# Patient Record
Sex: Male | Born: 1955 | Race: Black or African American | Hispanic: No | Marital: Married | Smoking: Never smoker
Health system: Southern US, Community
[De-identification: ages and names within clinical notes are randomized; demographics above are authoritative.]

## PROBLEM LIST (undated history)

## (undated) DIAGNOSIS — M130 Polyarthritis, unspecified: Secondary | ICD-10-CM

## (undated) DIAGNOSIS — R7303 Prediabetes: Secondary | ICD-10-CM

## (undated) DIAGNOSIS — M71161 Other infective bursitis, right knee: Secondary | ICD-10-CM

## (undated) DIAGNOSIS — N529 Male erectile dysfunction, unspecified: Secondary | ICD-10-CM

## (undated) DIAGNOSIS — M109 Gout, unspecified: Secondary | ICD-10-CM

## (undated) DIAGNOSIS — E119 Type 2 diabetes mellitus without complications: Secondary | ICD-10-CM

## (undated) HISTORY — DX: Gout, unspecified: M10.9

## (undated) HISTORY — PX: ROTATOR CUFF REPAIR: SHX139

## (undated) HISTORY — DX: Male erectile dysfunction, unspecified: N52.9

## (undated) HISTORY — PX: KNEE SURGERY: SHX244

## (undated) HISTORY — PX: CHOLECYSTECTOMY: SHX55

## (undated) HISTORY — DX: Polyarthritis, unspecified: M13.0

## (undated) HISTORY — PX: ACHILLES TENDON REPAIR: SUR1153

## (undated) HISTORY — DX: Other infective bursitis, right knee: M71.161

---
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
## 2001-11-23 ENCOUNTER — Inpatient Hospital Stay (HOSPITAL_COMMUNITY): Admission: EM | Admit: 2001-11-23 | Discharge: 2001-11-25 | Payer: Self-pay | Admitting: Emergency Medicine

## 2001-11-23 ENCOUNTER — Encounter: Payer: Self-pay | Admitting: Emergency Medicine

## 2001-11-24 ENCOUNTER — Encounter: Payer: Self-pay | Admitting: Family Medicine

## 2001-11-29 ENCOUNTER — Encounter: Admission: RE | Admit: 2001-11-29 | Discharge: 2001-11-29 | Payer: Self-pay | Admitting: Family Medicine

## 2001-12-01 ENCOUNTER — Ambulatory Visit (HOSPITAL_COMMUNITY): Admission: RE | Admit: 2001-12-01 | Discharge: 2001-12-01 | Payer: Self-pay | Admitting: Sports Medicine

## 2001-12-01 ENCOUNTER — Encounter: Admission: RE | Admit: 2001-12-01 | Discharge: 2001-12-01 | Payer: Self-pay | Admitting: Family Medicine

## 2001-12-05 ENCOUNTER — Ambulatory Visit (HOSPITAL_COMMUNITY): Admission: RE | Admit: 2001-12-05 | Discharge: 2001-12-05 | Payer: Self-pay | Admitting: Sports Medicine

## 2001-12-12 ENCOUNTER — Encounter: Admission: RE | Admit: 2001-12-12 | Discharge: 2001-12-12 | Payer: Self-pay | Admitting: Family Medicine

## 2002-01-10 ENCOUNTER — Encounter: Admission: RE | Admit: 2002-01-10 | Discharge: 2002-01-10 | Payer: Self-pay | Admitting: Family Medicine

## 2002-11-08 ENCOUNTER — Encounter: Payer: Self-pay | Admitting: Emergency Medicine

## 2002-11-08 ENCOUNTER — Emergency Department (HOSPITAL_COMMUNITY): Admission: EM | Admit: 2002-11-08 | Discharge: 2002-11-08 | Payer: Self-pay | Admitting: Emergency Medicine

## 2002-11-29 ENCOUNTER — Encounter: Admission: RE | Admit: 2002-11-29 | Discharge: 2002-11-29 | Payer: Self-pay | Admitting: Specialist

## 2002-11-29 ENCOUNTER — Encounter: Payer: Self-pay | Admitting: Specialist

## 2004-03-26 ENCOUNTER — Encounter: Admission: RE | Admit: 2004-03-26 | Discharge: 2004-03-26 | Payer: Self-pay | Admitting: Occupational Medicine
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
## 2004-04-02 ENCOUNTER — Emergency Department (HOSPITAL_COMMUNITY): Admission: EM | Admit: 2004-04-02 | Discharge: 2004-04-02 | Payer: Self-pay | Admitting: Emergency Medicine

## 2004-04-02 IMAGING — CR DG CHEST 2V
2 series · 2 of 2 positions shown · non-contrast
Comparison: none

CLINICAL DATA: Cough for five days. Some chest pain. Upper respiratory symptoms. 
 CHEST-TWO VIEW:
 PA and lateral views of the chest without previous films for comparison are compared to a report of [DATE] which reported some bilateral basilar atelectasis or scarring.  Now there is some atelectasis or scarring seen associated with the left base. There is mild diffuse peribronchial thickening consistent with bronchitis. There is no consolidation, pleural effusion or pneumothorax. The heart and mediastinum are normal for this degree of inspiration.  The bones appear normal.

[view not recorded (1 of 2)]
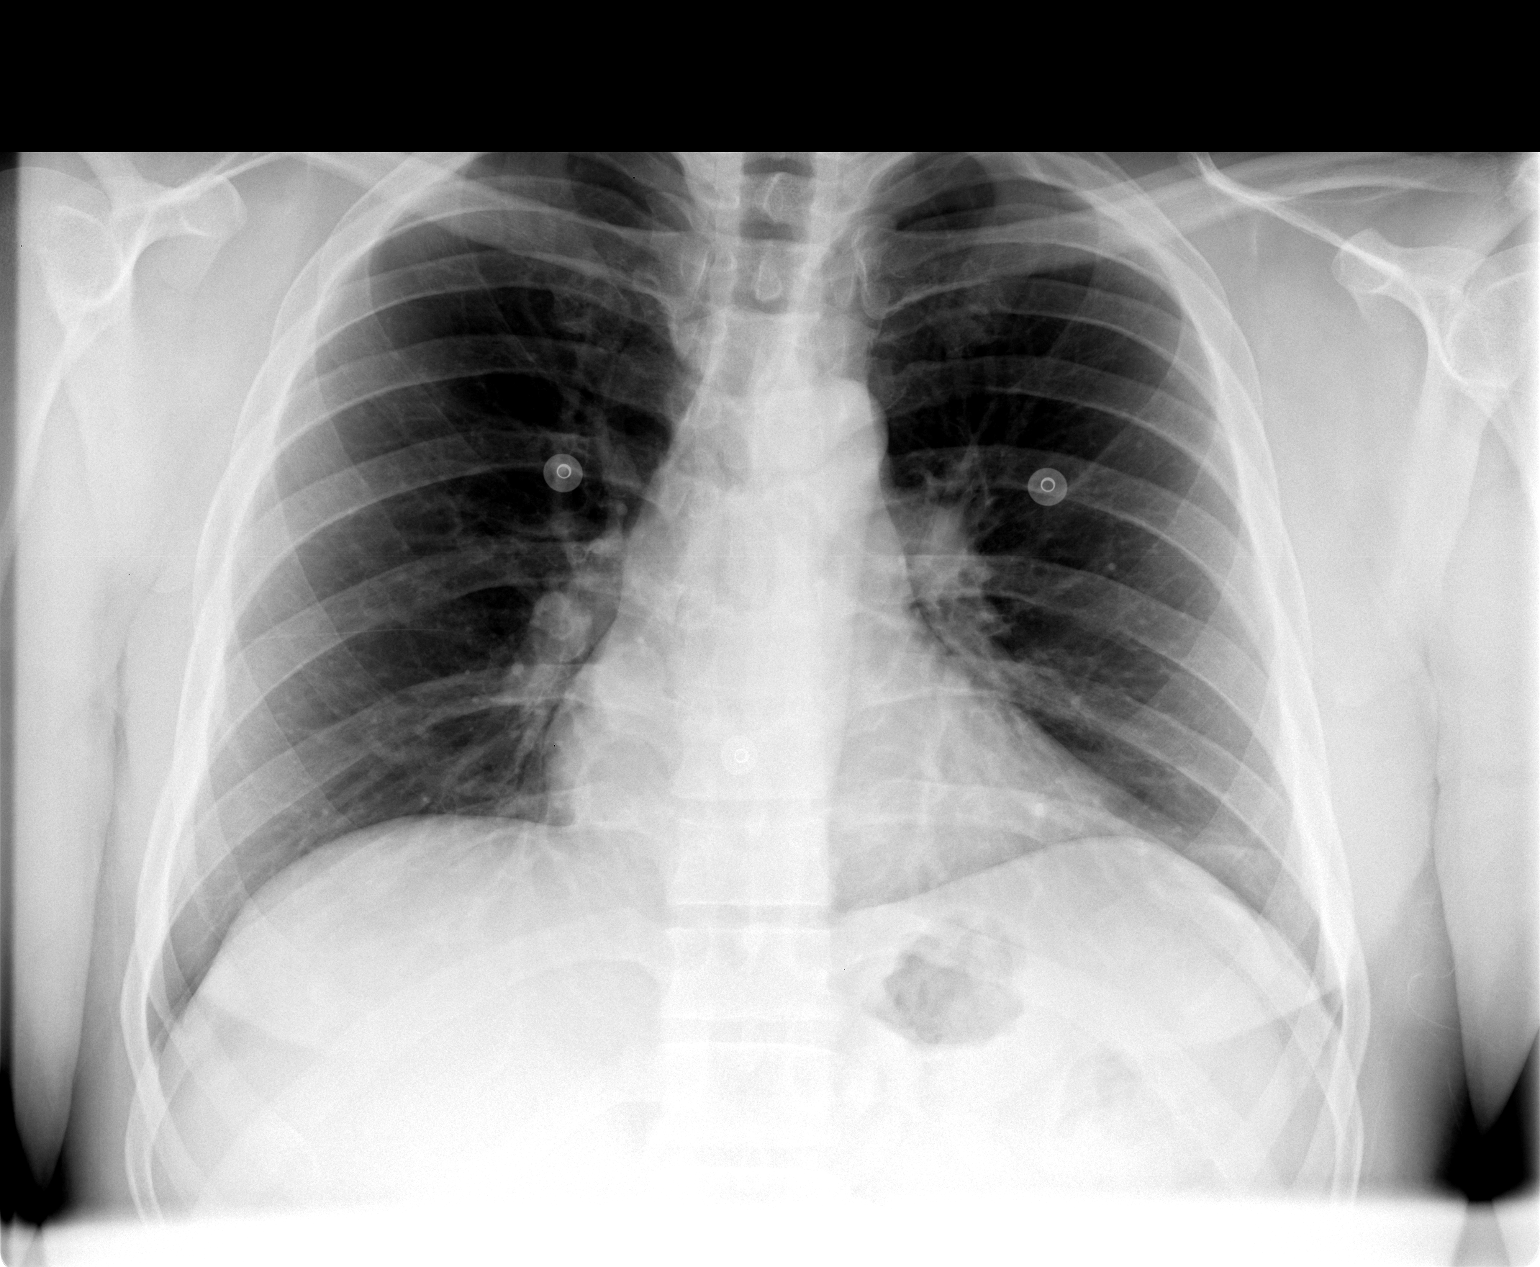

[view not recorded (2 of 2)]
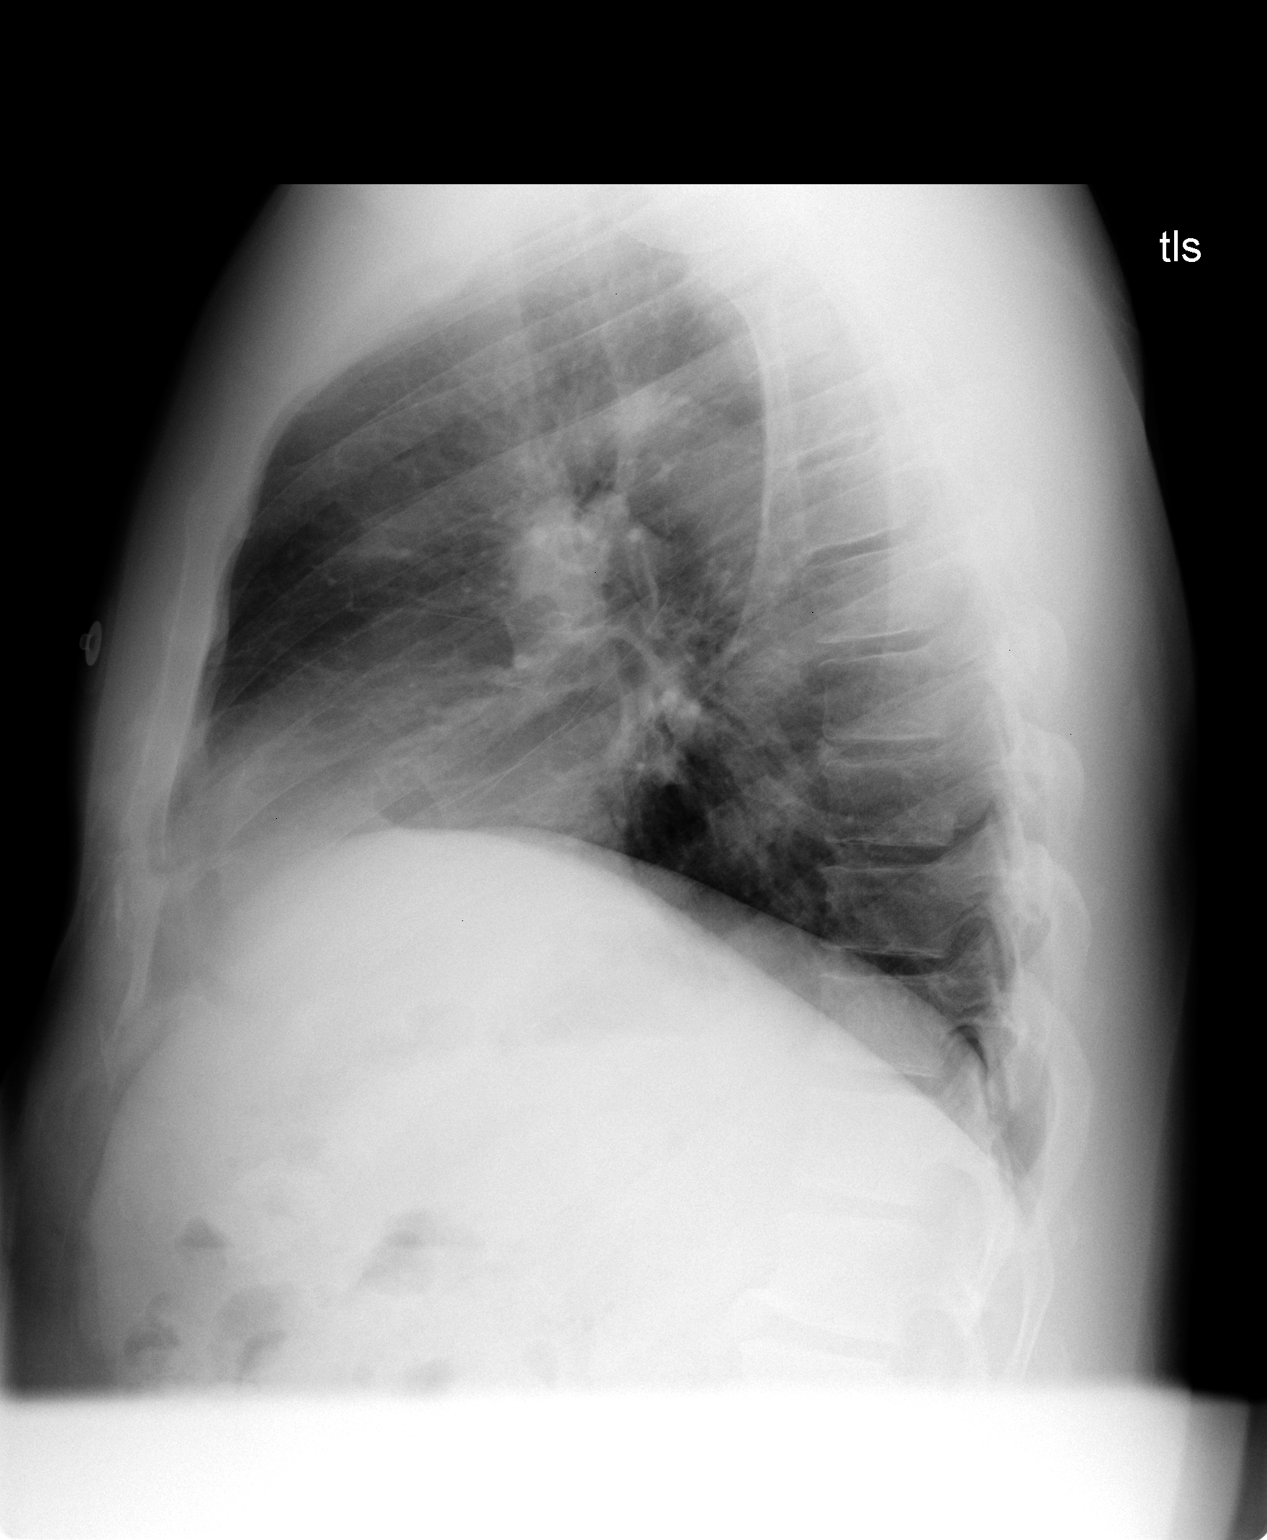

[2 of 2 positions shown; findings below may reference images not displayed]

IMPRESSION: Mild diffuse peribronchial thickening.  Mild scarring and/or atelectasis left base thought to be similar to a previously reported change of [DATE].

## 2005-01-06 ENCOUNTER — Encounter: Admission: RE | Admit: 2005-01-06 | Discharge: 2005-01-06 | Payer: Self-pay | Admitting: Occupational Medicine
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
## 2005-03-02 HISTORY — PX: THUMB ARTHROSCOPY: SHX2509

## 2005-08-21 ENCOUNTER — Emergency Department (HOSPITAL_COMMUNITY): Admission: EM | Admit: 2005-08-21 | Discharge: 2005-08-21 | Payer: Self-pay | Admitting: Emergency Medicine

## 2005-08-21 IMAGING — CR DG FOOT COMPLETE 3+V*R*
3 series · 3 of 3 positions shown · non-contrast
Comparison: none

CLINICAL DATA: No injury.  Soft tissue swelling.  Great toe pain. 
RIGHT FOOT - 3 VIEW ? [DATE]:

[t foot ap right]
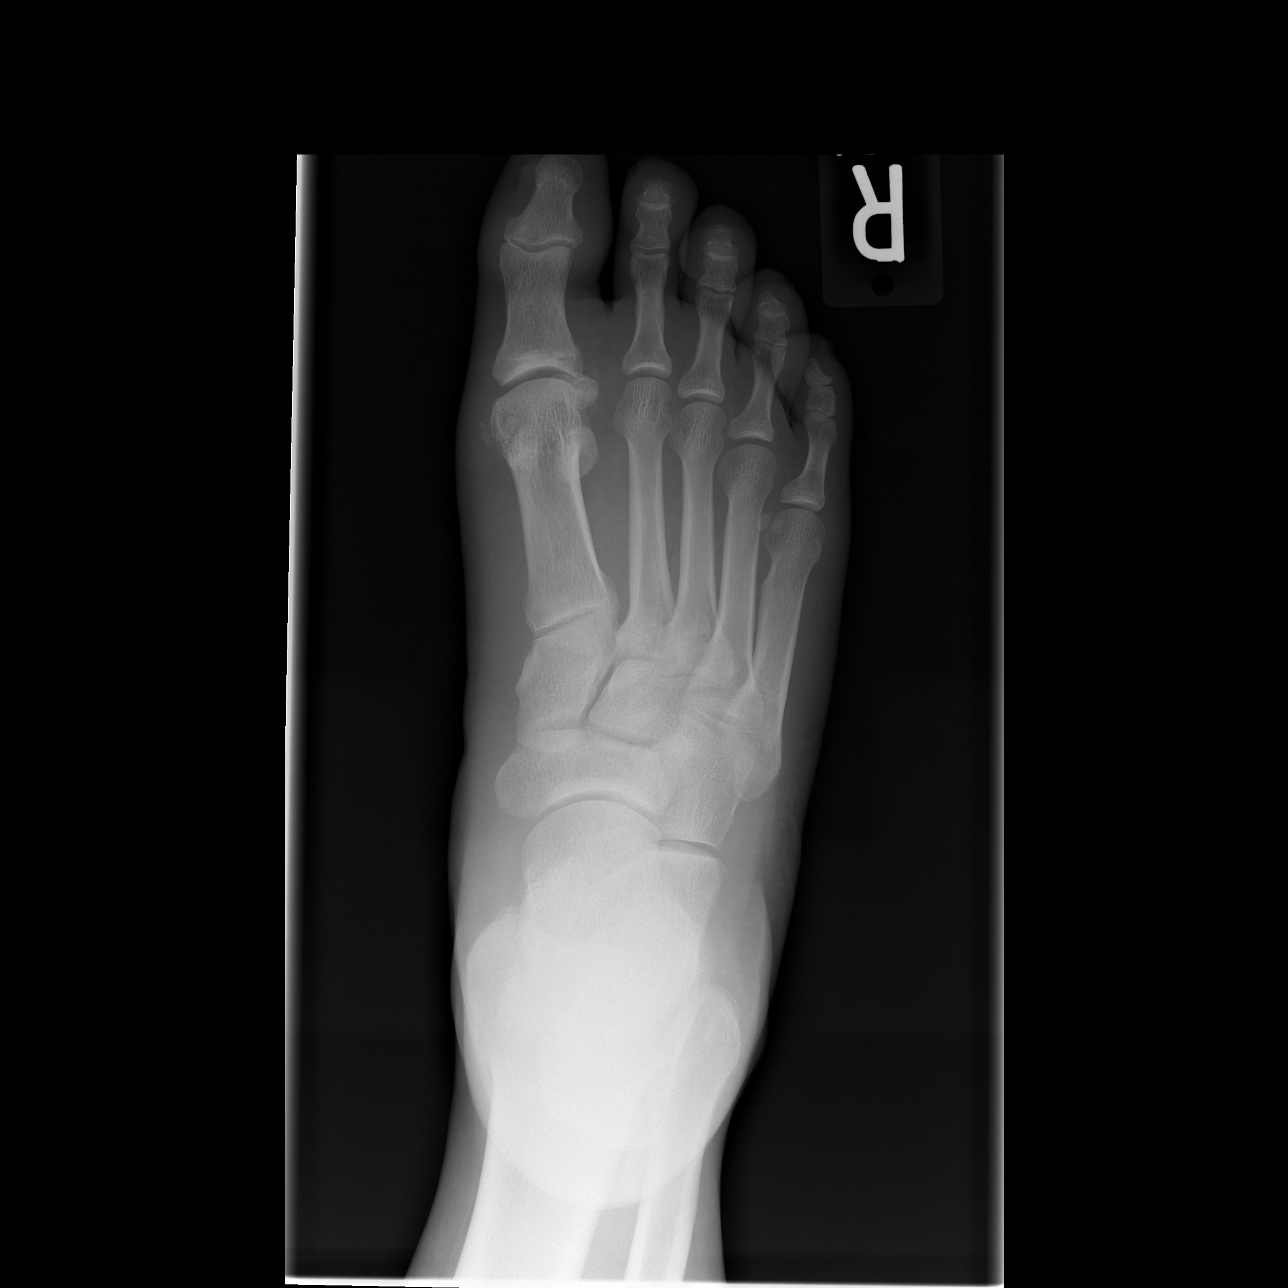

[t foot oblique right]
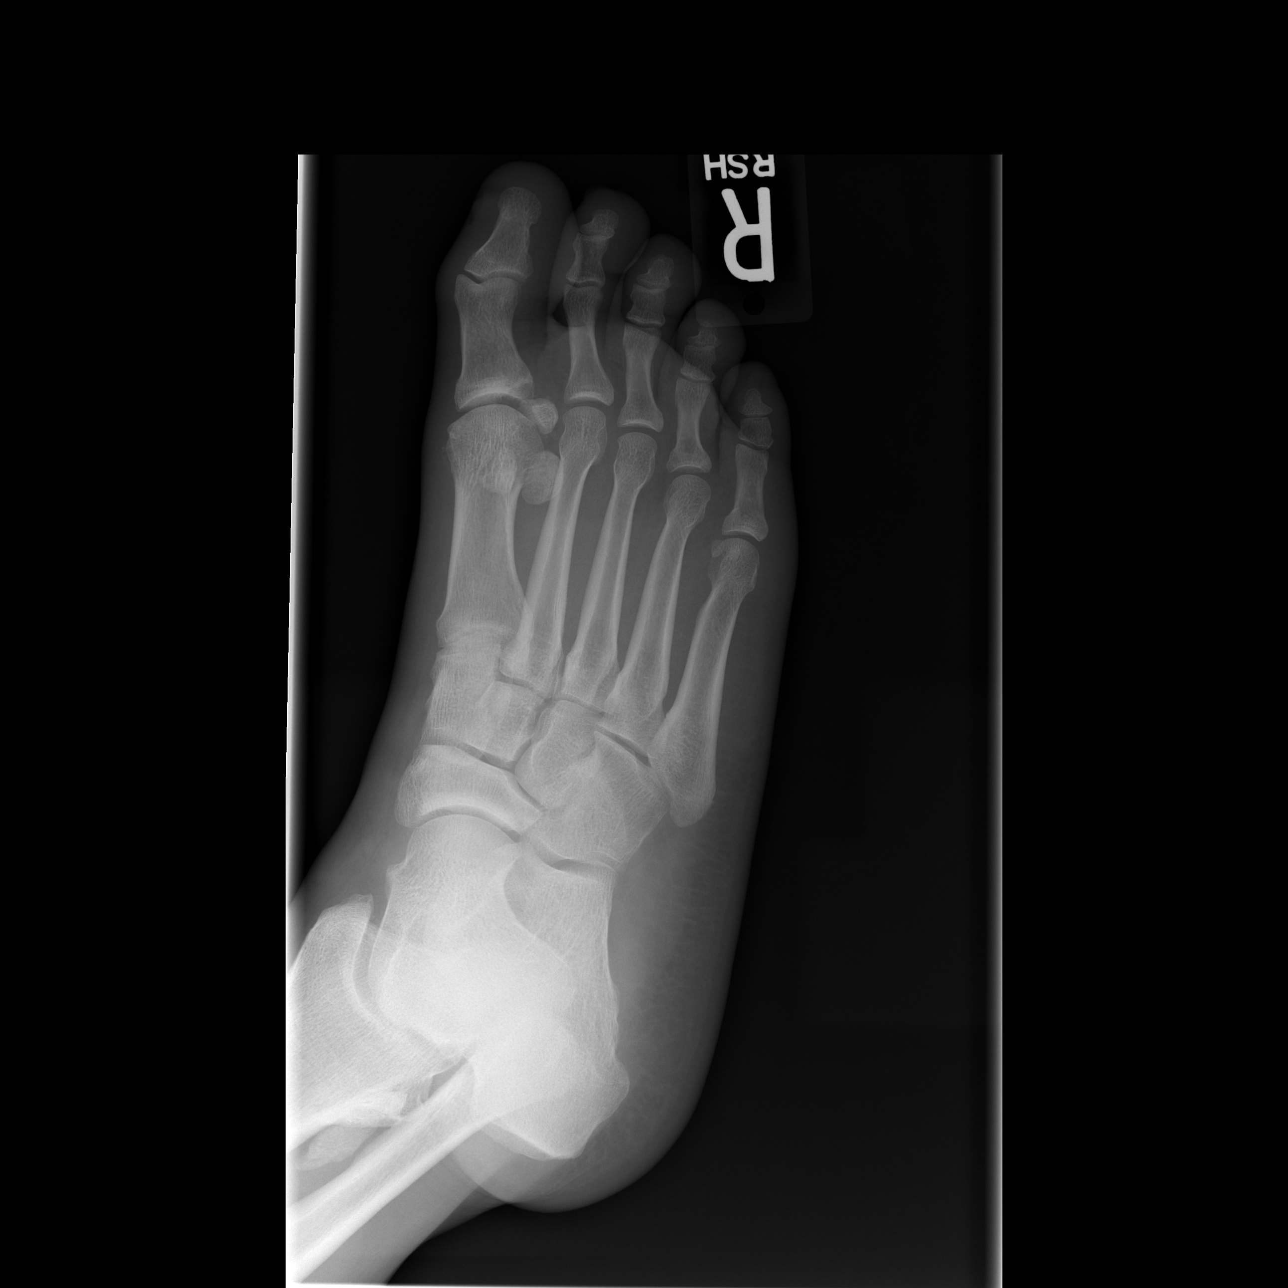

[t foot lat right]
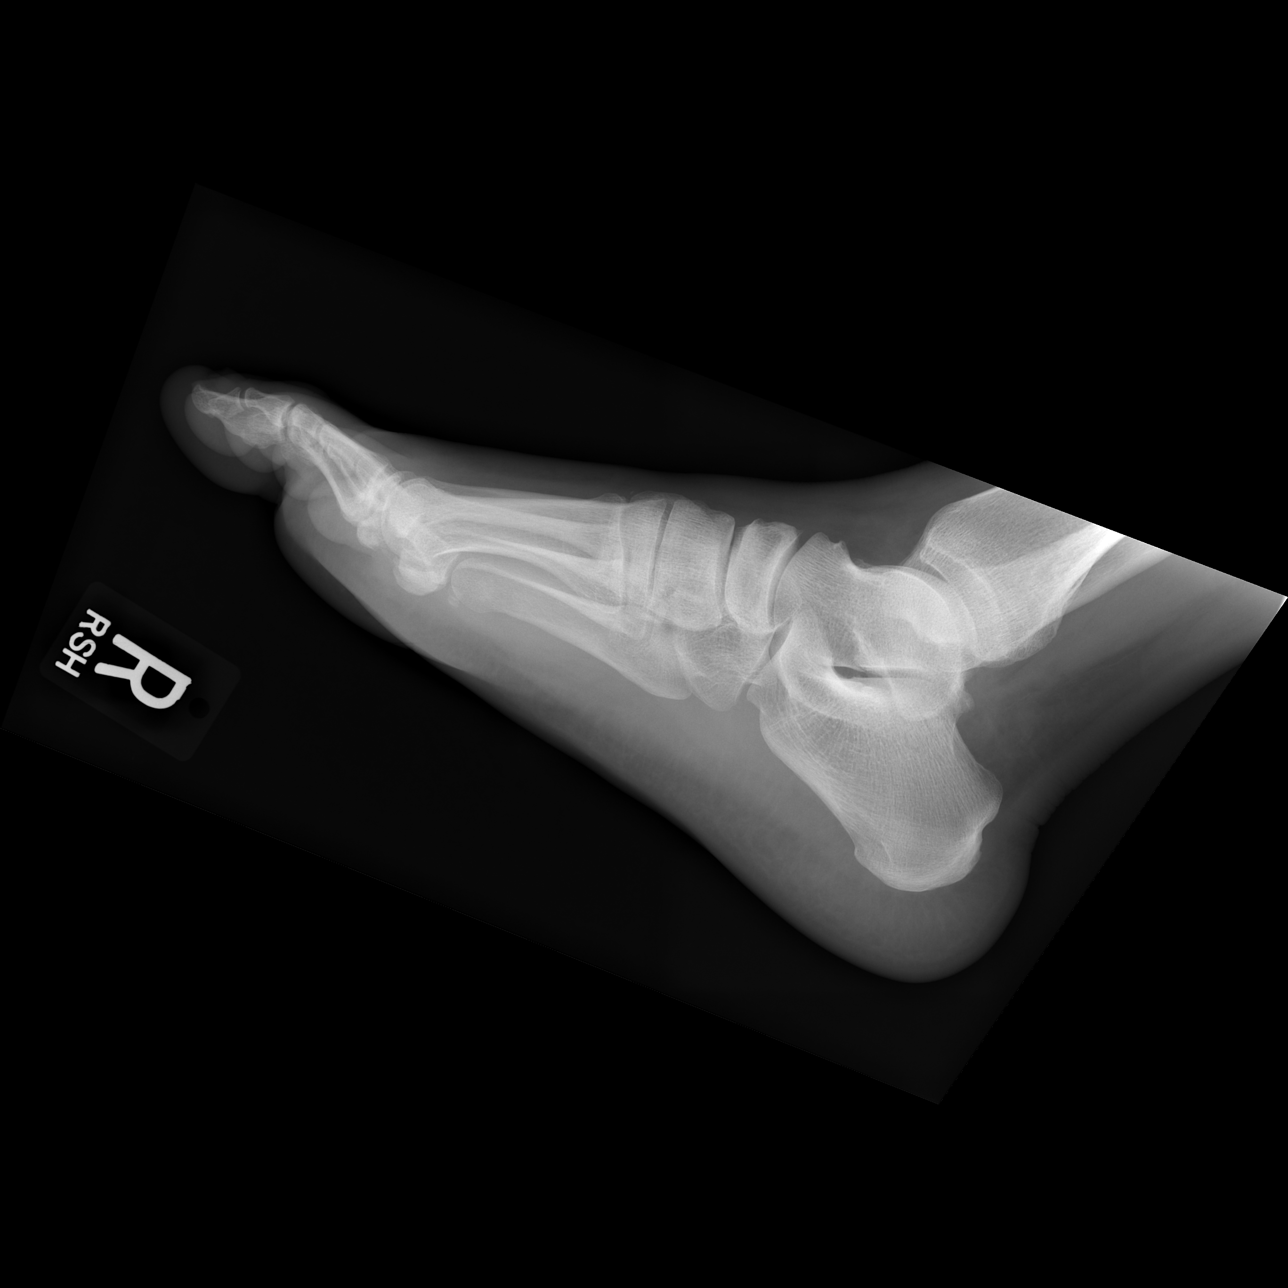

[3 of 3 positions shown; findings below may reference images not displayed]

FINDINGS: Periarticular small calcifications are seen at the right great toe both medially and laterally with no joint space narrowing nor periarticular osteoporosis nor erosions.  This may represent hydroxyapatite (pseudogout) or gouty change.  No other significant abnormality is seen.
IMPRESSION: Periarticular calcification, right first metatarsophalangeal joint, which may represent hydroxyapatite (pseudogout) or gout, although no associated typical gouty arthritic features are seen.

## 2007-01-23 ENCOUNTER — Encounter (INDEPENDENT_AMBULATORY_CARE_PROVIDER_SITE_OTHER): Payer: Self-pay | Admitting: Surgery

## 2007-01-23 ENCOUNTER — Inpatient Hospital Stay (HOSPITAL_COMMUNITY): Admission: EM | Admit: 2007-01-23 | Discharge: 2007-01-25 | Payer: Self-pay | Admitting: Family Medicine

## 2007-01-23 IMAGING — CR DG CHEST 2V
2 series · 2 of 2 positions shown · non-contrast
Comparison: Two-view chest of [DATE].

CLINICAL DATA: Abdominal pain and vomiting.
 CHEST ? 2 VIEW:

[w chest pa]
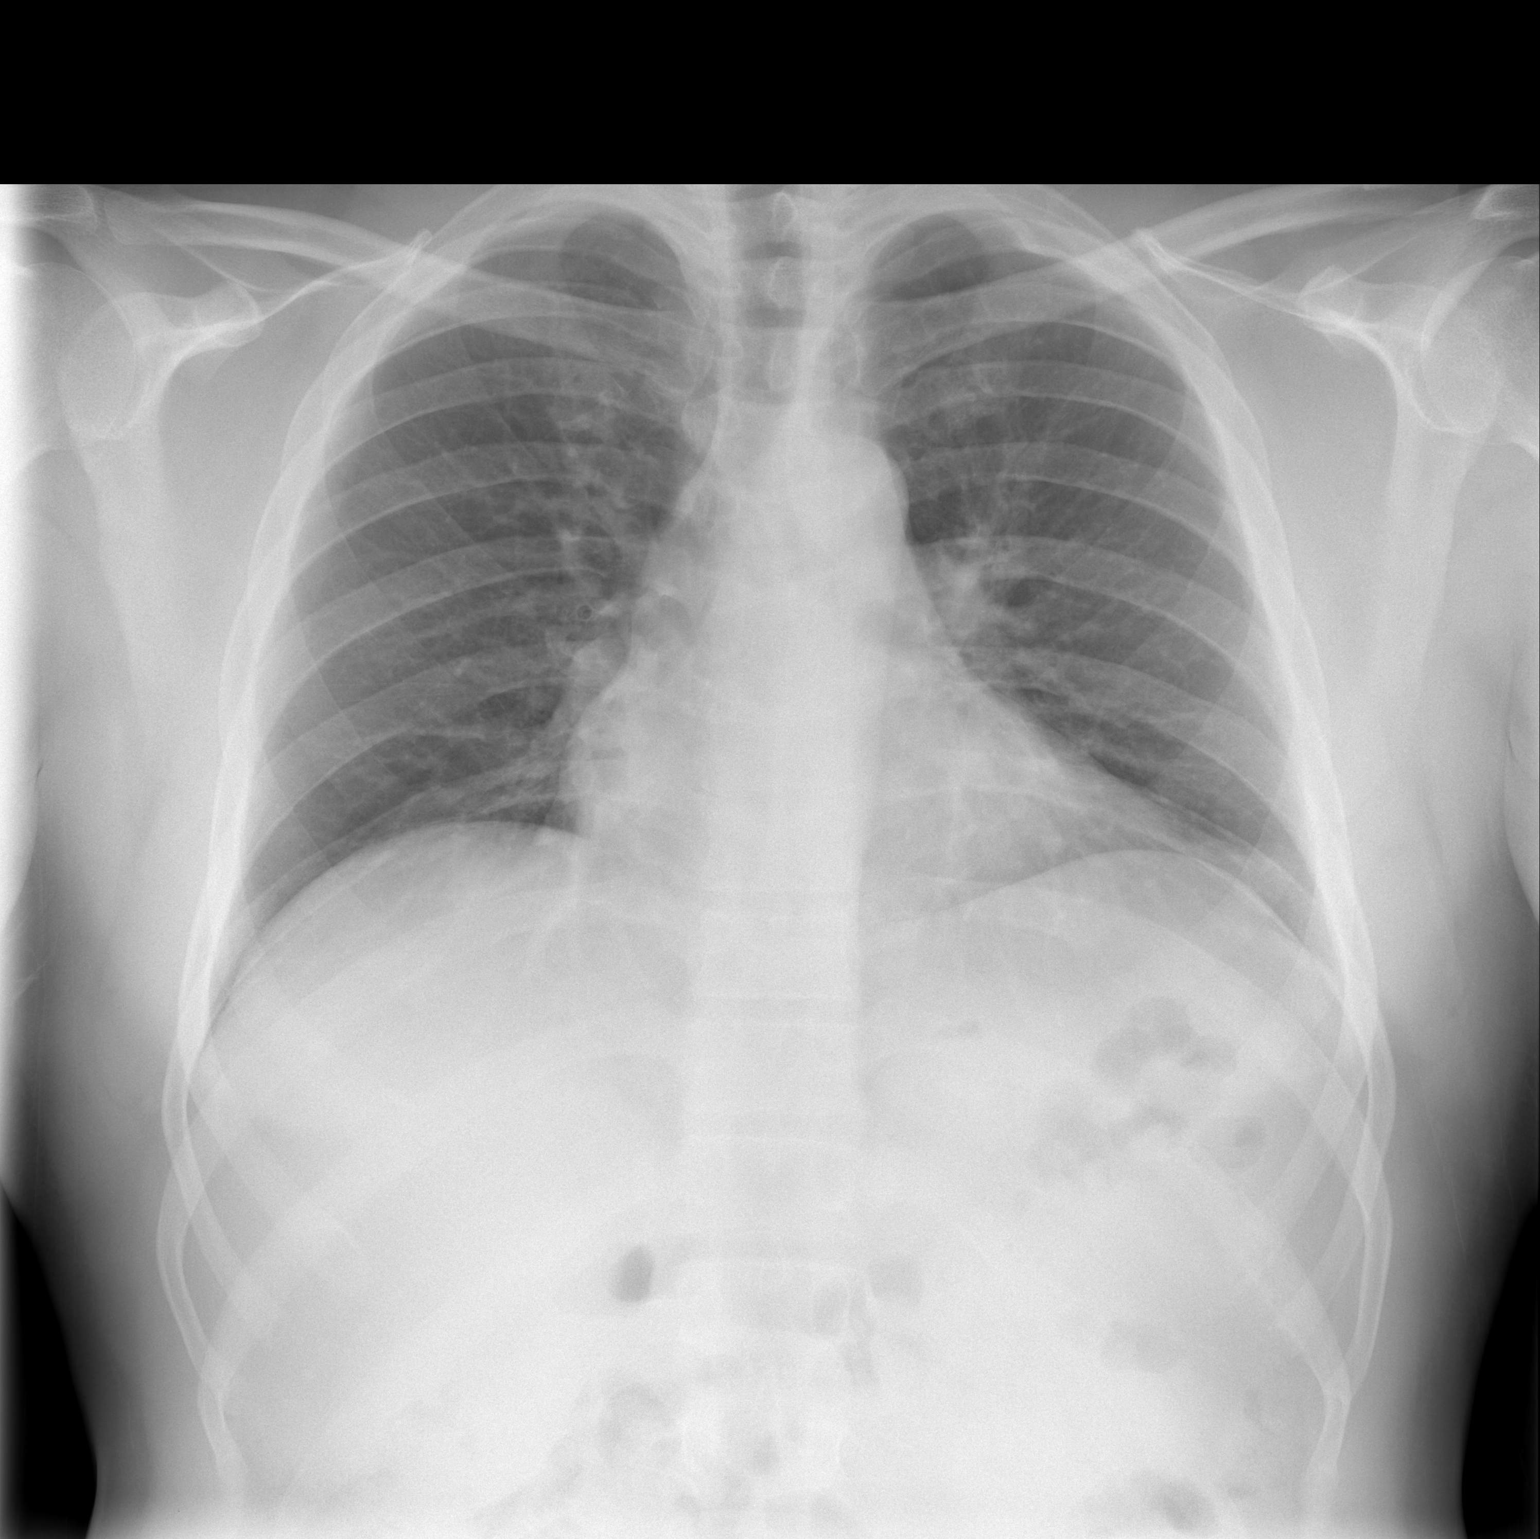

[w chest lat *]
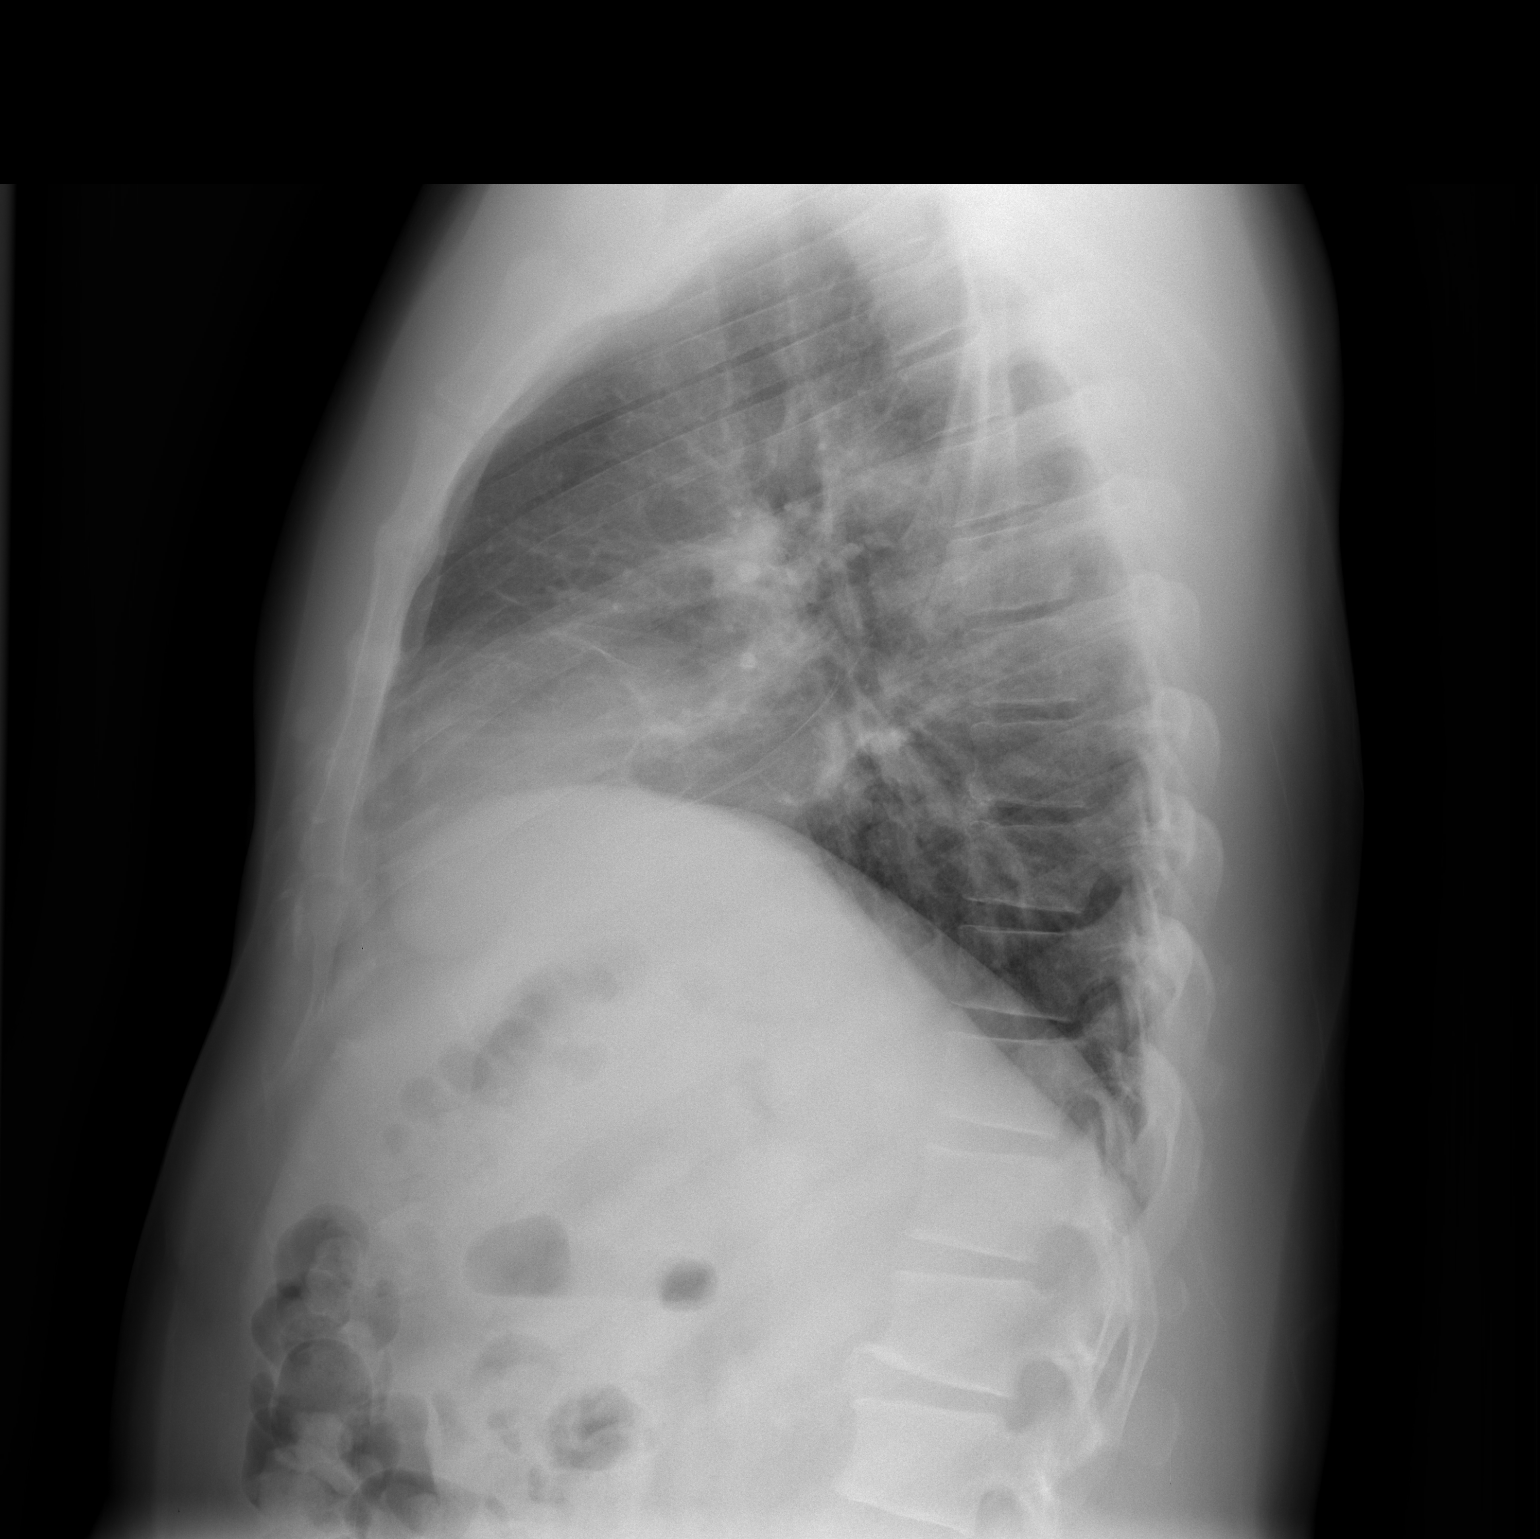

[2 of 2 positions shown; findings below may reference images not displayed]

FINDINGS: Normal mediastinum and cardiac silhouette.  Costophrenic angles are clear.  There is left basilar linear markings which appear unchanged from prior representing either atelectasis or scarring.  No focal infiltrate is identified.  No evidence of pneumothorax.  There is mild coarsening of the central bronchovascular markings.
IMPRESSION: 1. Liner markings at left lung base appear stable from prior and likely represent chronic atelectasis and scarring.
 2. No evidence of focal infiltrate.
 3. Chronic bronchitic change.

## 2007-01-23 IMAGING — RF DG CHOLANGIOGRAM OPERATIVE
1 series · 8 of 8 positions shown · non-contrast
Comparison: none

CLINICAL DATA: Cholecystitis and cholelithiasis. 
INTRAOPERATIVE CHOLANGIOGRAM ? [DATE]:

[Series 1: run · 2 acquisitions, 8 frames shown]
[im 1/2]
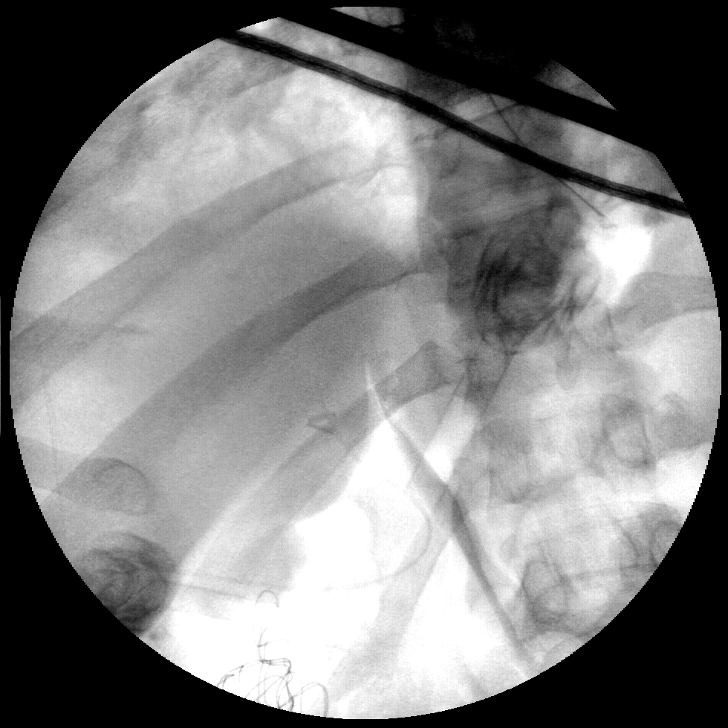
[im 1/2]
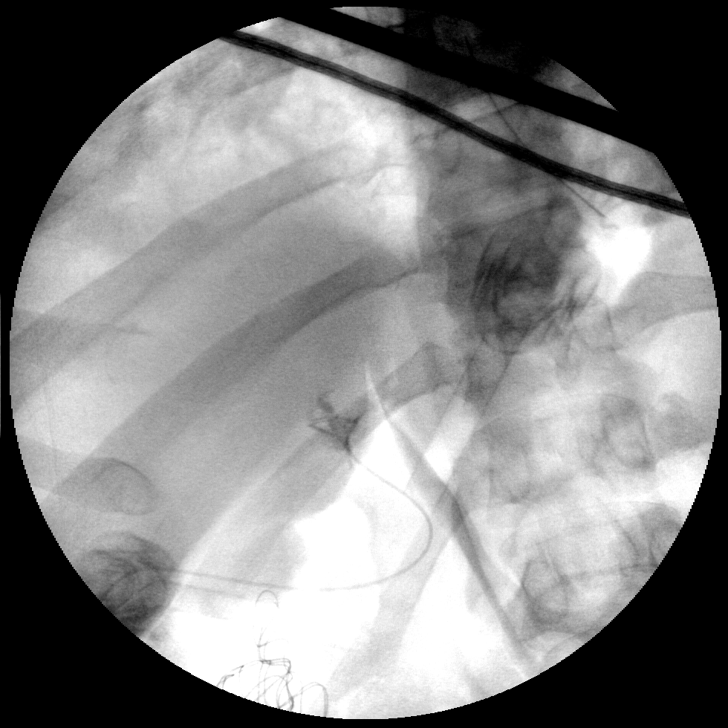
[im 1/2]
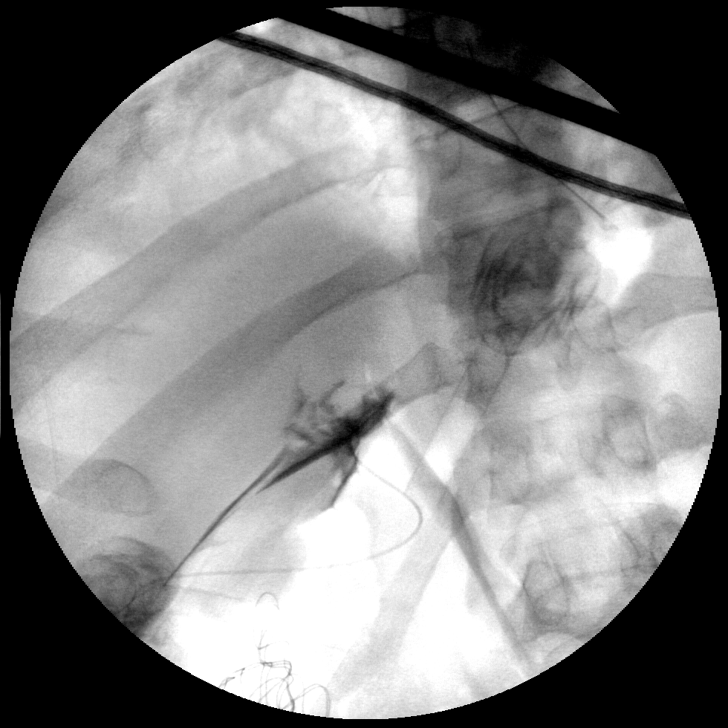
[im 1/2]
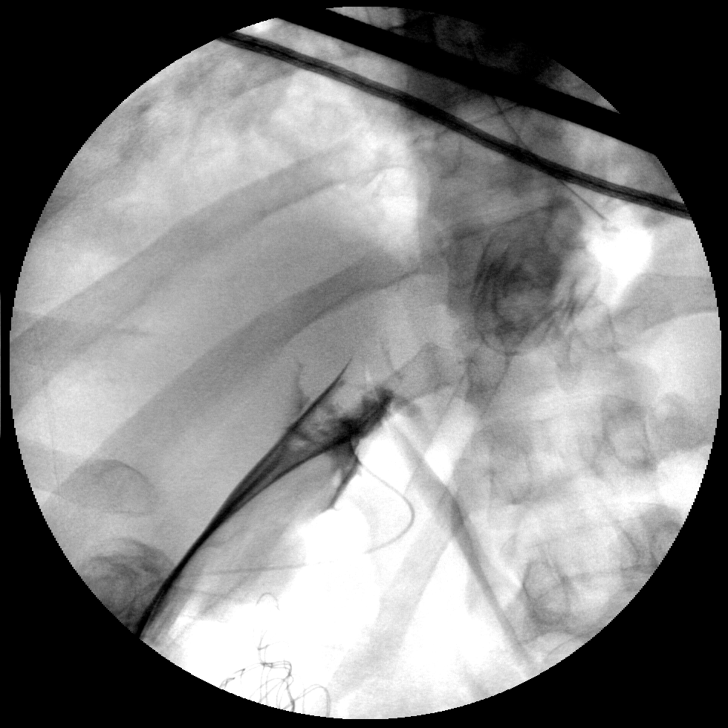
[im 2/2]
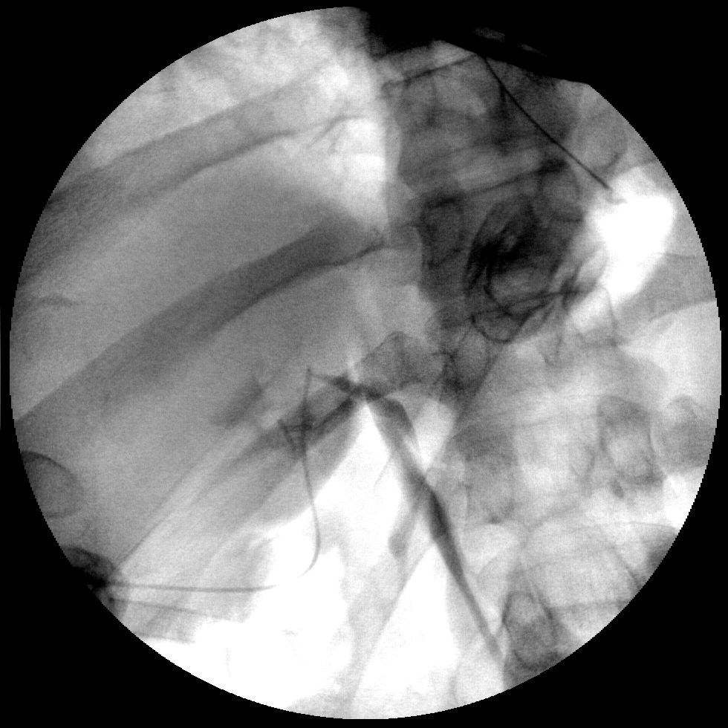
[im 2/2]
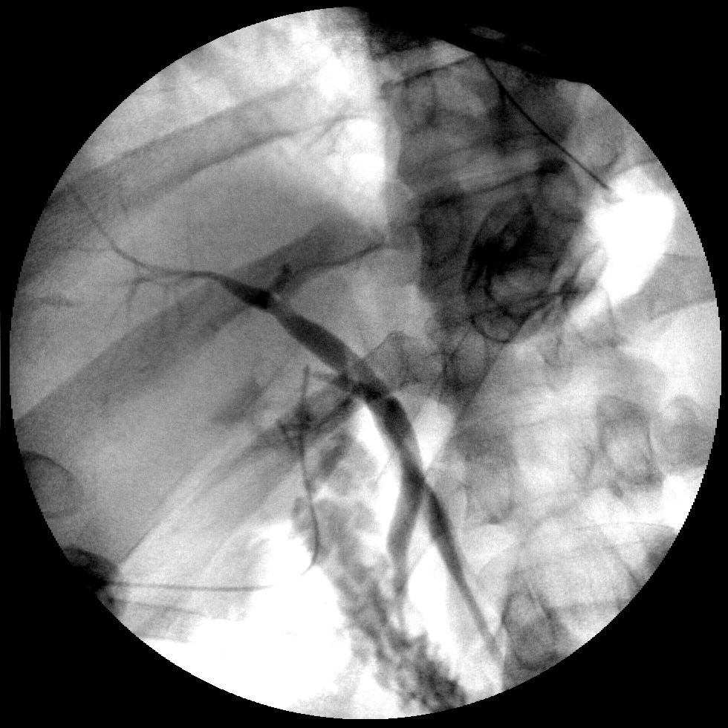
[im 2/2]
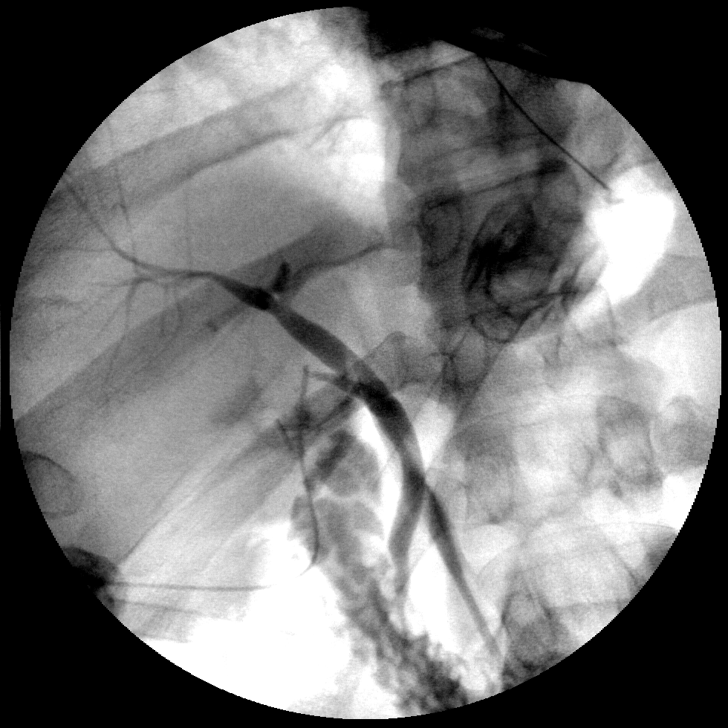
[im 2/2]
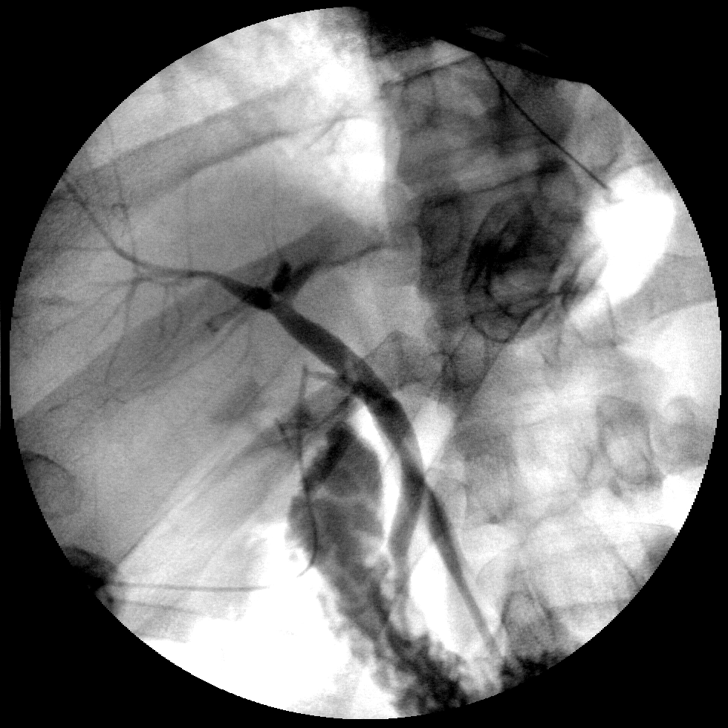

[8 of 8 positions shown; findings below may reference images not displayed]

FINDINGS: Images were presented by Dr. IVAN ALFONSO.  injection of contrast demonstrates extravasation outside of the biliary system.  A follow-up cholangiogram demonstrates cannulation of the cystic duct and opacification of the common bile duct as well as the duodenum.  There is no evidence for a large filling defect or retained stone.  Minor filling of the intrahepatic biliary system shows a nondilated system.
IMPRESSION: Nondilated biliary system without a retained stone or filling defect.

## 2007-01-23 IMAGING — US US ABDOMEN COMPLETE
1 series · 14 of 25 positions shown · non-contrast
Comparison: None.

CLINICAL DATA: Abdominal pain.
ABDOMEN ULTRASOUND:
TECHNIQUE: Complete abdominal ultrasound examination was performed including evaluation of the liver, gallbladder, bile ducts, pancreas, kidneys, spleen, IVC, and abdominal aorta.

[Series 1: unknown · 0.34mm/px · 14 of 79 slices shown]
[im 1/79]
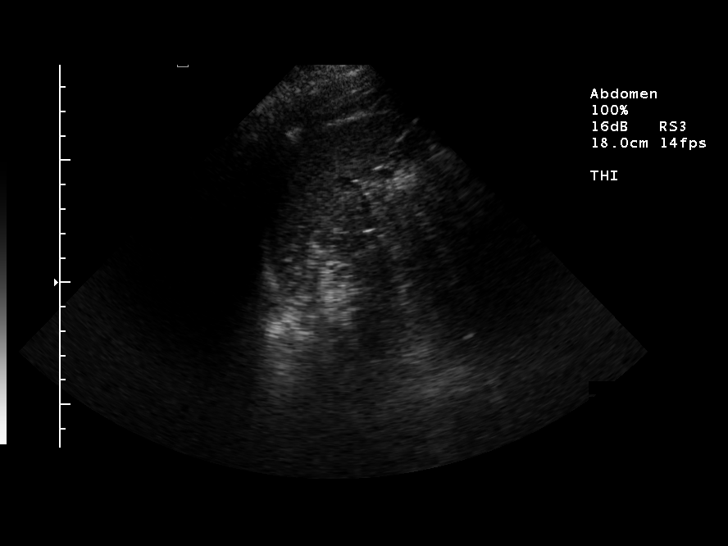
[im 7/79]
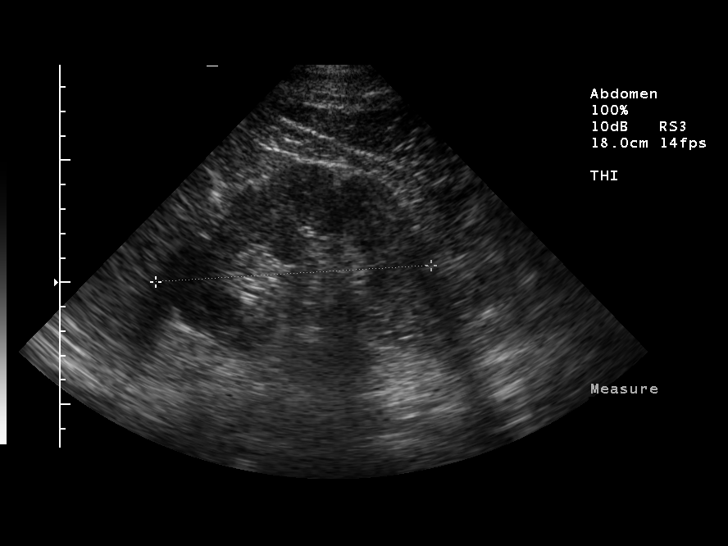
[im 14/79]
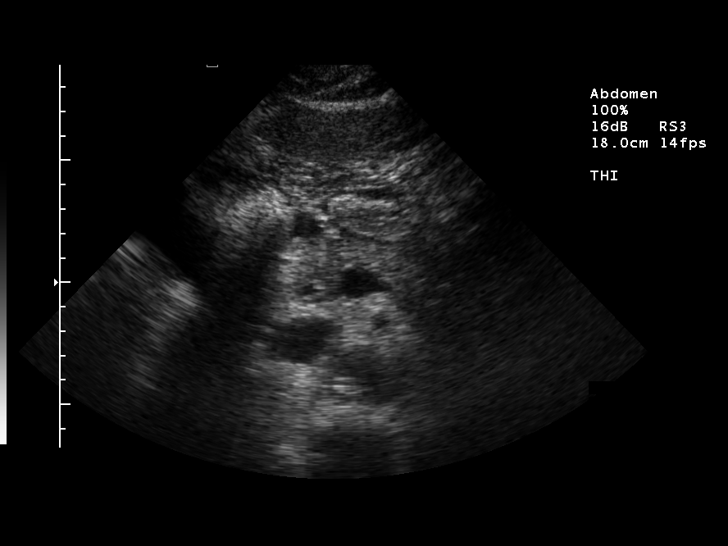
[im 20/79]
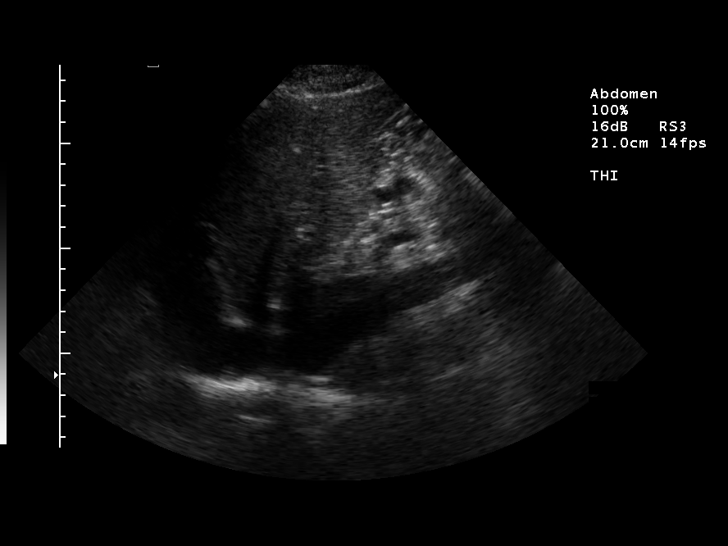
[im 27/79]
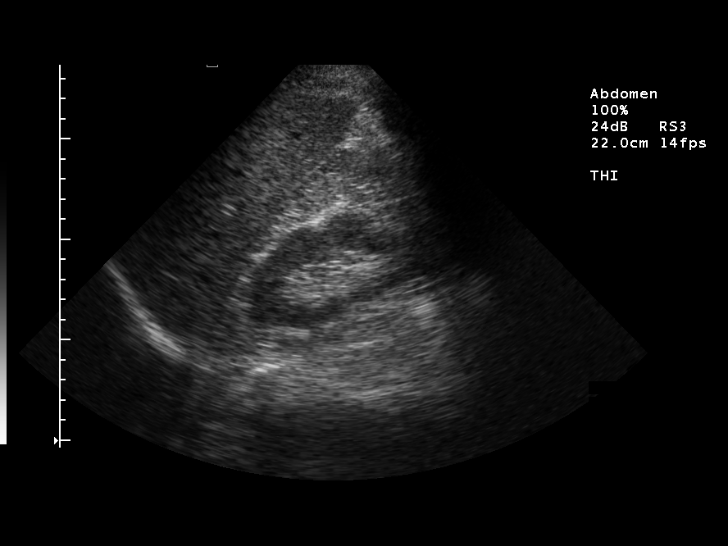
[im 30/79]
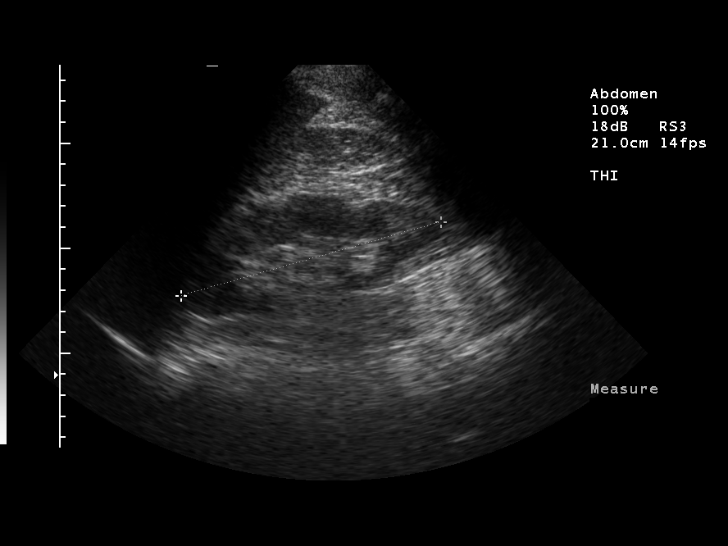
[im 36/79]
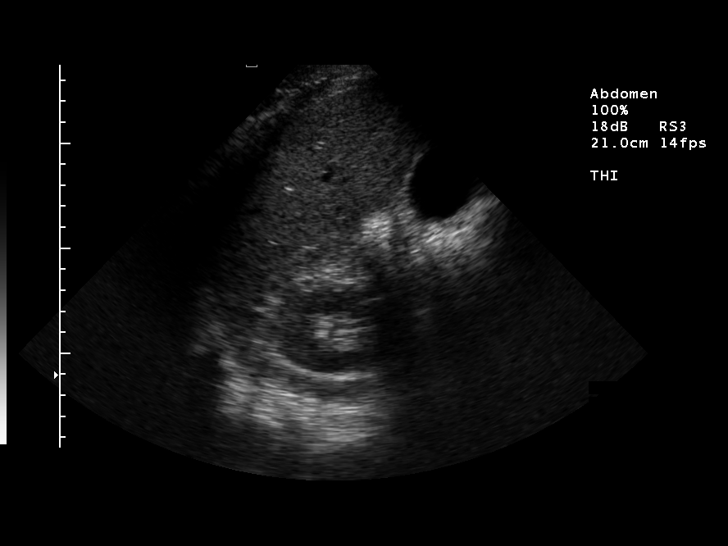
[im 43/79]
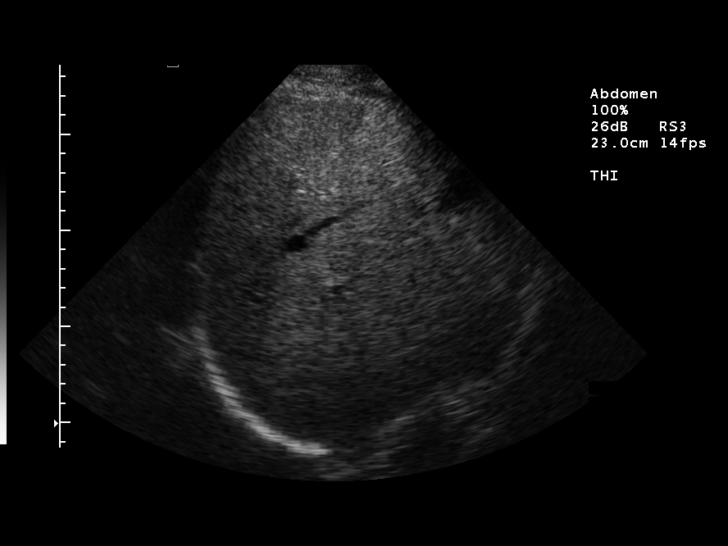
[im 49/79]
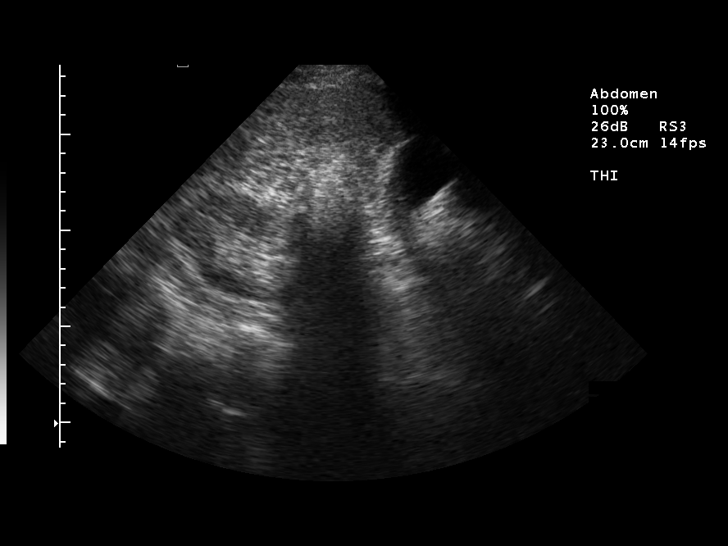
[im 53/79]
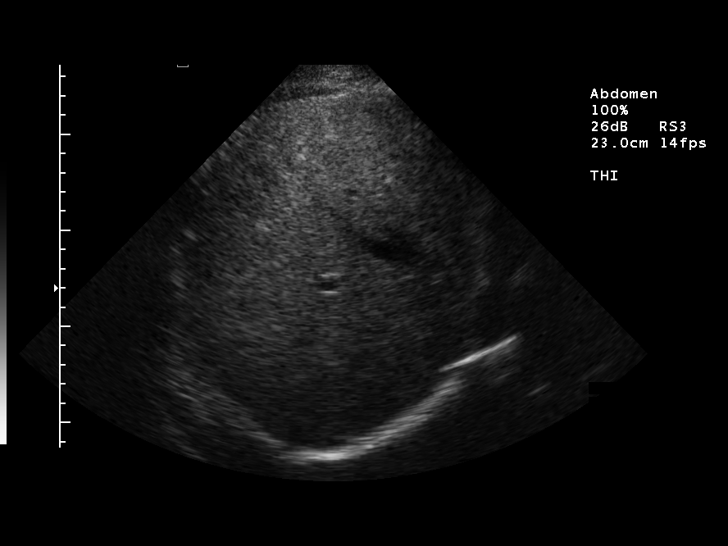
[im 59/79]
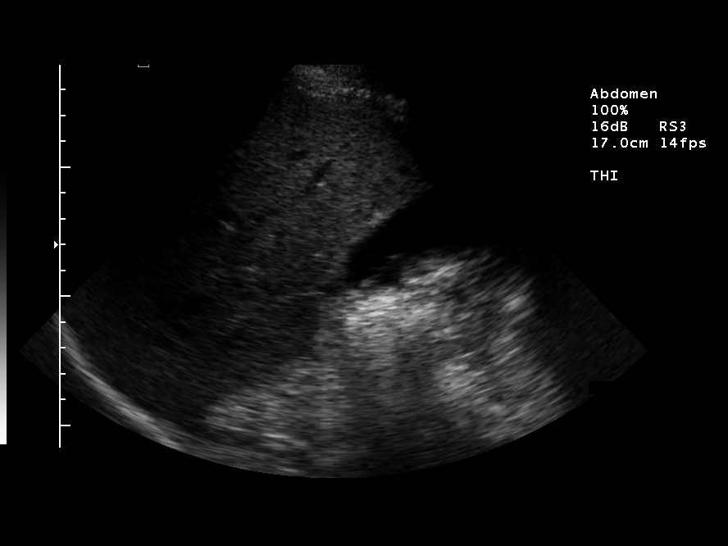
[im 66/79]
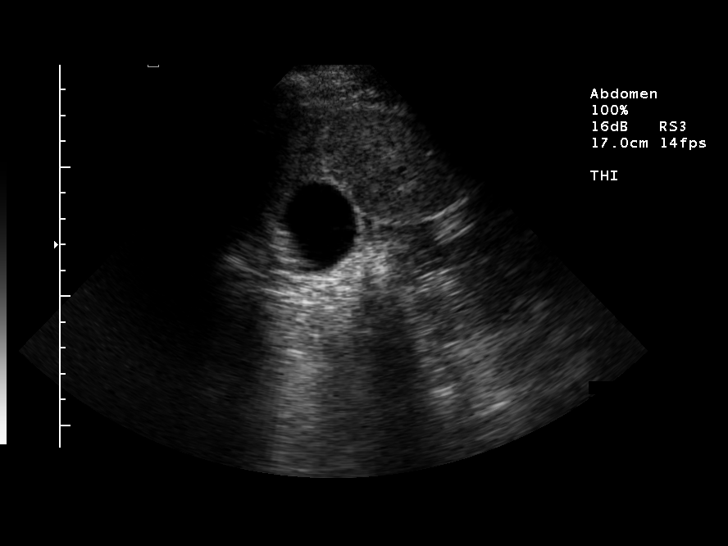
[im 72/79]
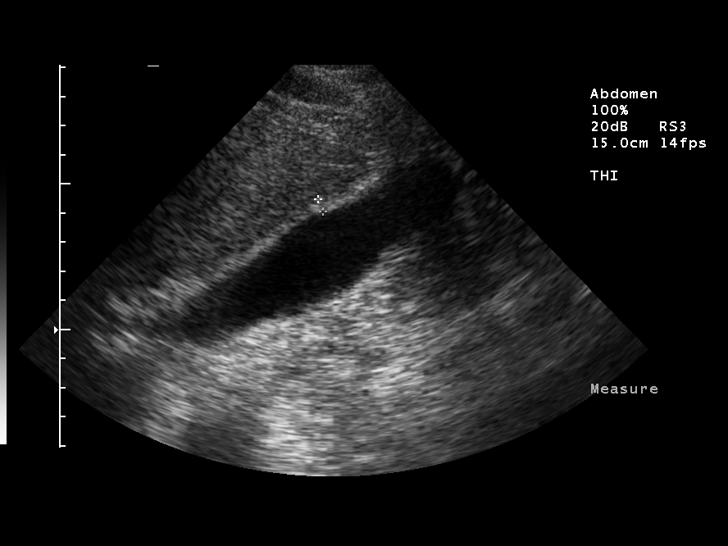
[im 79/79]
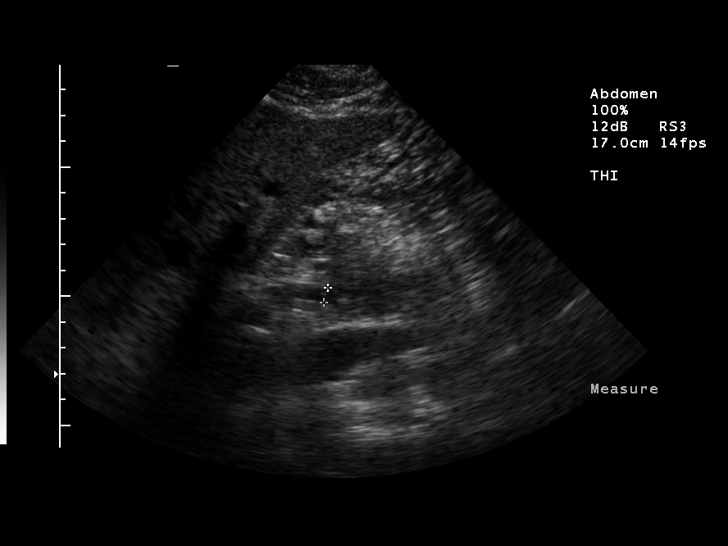

[14 of 25 positions shown; findings below may reference images not displayed]

FINDINGS: Gallstones, gallbladder wall thickening and sonographic Murphy?s sign are present.  Common duct measures 7 mm.  The liver, intrahepatic IVC, spleen and abdominal aorta are normal in appearance.  The kidneys are normal in size but have a slightly lobulated contour.  The pancreatic duct is borderline enlarged measuring 4 mm without focal cutoff or caliper change.
IMPRESSION: Gallstones, gallbladder wall thickening and positive sonographic Murphy?s sign suspicious for cholecystitis.  If further information is desired, consider HIDA scan for functional evaluation.

## 2008-07-07 ENCOUNTER — Emergency Department (HOSPITAL_COMMUNITY): Admission: EM | Admit: 2008-07-07 | Discharge: 2008-07-07 | Payer: Self-pay | Admitting: Emergency Medicine
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
## 2009-05-22 ENCOUNTER — Encounter: Payer: Self-pay | Admitting: Sports Medicine

## 2009-06-20 ENCOUNTER — Ambulatory Visit: Payer: Self-pay | Admitting: Sports Medicine

## 2009-06-20 DIAGNOSIS — M67919 Unspecified disorder of synovium and tendon, unspecified shoulder: Secondary | ICD-10-CM | POA: Insufficient documentation

## 2009-06-20 DIAGNOSIS — M25519 Pain in unspecified shoulder: Secondary | ICD-10-CM | POA: Insufficient documentation

## 2009-06-20 DIAGNOSIS — M719 Bursopathy, unspecified: Secondary | ICD-10-CM | POA: Insufficient documentation

## 2009-07-16 ENCOUNTER — Emergency Department (HOSPITAL_COMMUNITY): Admission: EM | Admit: 2009-07-16 | Discharge: 2009-07-16 | Payer: Self-pay | Admitting: Emergency Medicine
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
## 2009-10-01 ENCOUNTER — Emergency Department (HOSPITAL_COMMUNITY): Admission: EM | Admit: 2009-10-01 | Discharge: 2009-10-01 | Payer: Self-pay | Admitting: Family Medicine

## 2009-10-21 ENCOUNTER — Emergency Department (HOSPITAL_COMMUNITY): Admission: EM | Admit: 2009-10-21 | Discharge: 2009-10-21 | Payer: Self-pay | Admitting: Family Medicine
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
## 2010-04-03 NOTE — Consult Note (Signed)
Summary: [facility] [place]   Imported By: Marily Memos 07/05/2009 10:01:46  _____________________________________________________________________  External Attachment:    Type:   Image     Comment:   External Document

## 2010-07-15 NOTE — Op Note (Signed)
[name], [name]              ACCOUNT NO.:  [id number]   MEDICAL RECORD NO.:  [id number]          PATIENT TYPE:  INP   LOCATION:  1531                         FACILITY:  [facility]   PHYSICIAN:  Velora Heckler, MD      DATE OF BIRTH:  [birth year]   DATE OF PROCEDURE:  01/23/2007  DATE OF DISCHARGE:                               OPERATIVE REPORT   PREOPERATIVE DIAGNOSIS:  Acute cholecystitis, cholelithiasis.   POSTOPERATIVE DIAGNOSIS:  Acute cholecystitis, cholelithiasis.   PROCEDURE:  Laparoscopic cholecystectomy with intraoperative  cholangiography.   SURGEON:  Velora Heckler, M.D., FACS   ASSISTANT:  None.   ANESTHESIA:  General per Dr. Lestine Box.   ESTIMATED BLOOD LOSS:  Minimal.   PREPARATION:  Betadine.   COMPLICATIONS:  None.   INDICATIONS:  The patient is a 55 year old black male who presents to  the emergency department with acute onset of epigastric abdominal pain,  nausea and vomiting.  The patient was seen and evaluated by the  emergency room physician.  Ultrasound was obtained.  This showed a thick-  walled gallbladder containing gallstones.  Laboratory studies showed an  elevated white blood cell count of 13,000.  General surgery was  consulted.  The patient was seen and evaluated by Dr. Manus Rudd.  As  I was the physician on-call for [facility] on January 23, 2007.  The patient was seen and evaluated again by myself and prepared  for the operating room for cholecystectomy.   BODY OF REPORT:  Procedure is done in operating room #1 at [facility].  The patient is brought to the operating room,  placed in a supine position on the operating room table.  Following  administration of general anesthesia the patient is prepped and draped  in the usual strict aseptic fashion.  After ascertaining that an  adequate level of anesthesia had been obtained, an infraumbilical  incision was made with a #15 blade.  Dissection was  carried down to the  fascia.  Fascia is incised in the midline and the peritoneal cavity is  entered cautiously.  A 0-Vicryl pursestring suture is placed in the  fascia.  An assigned cannula was introduced and secured with a  pursestring suture.  The abdomen was insufflated with carbon dioxide.  Laparoscope was introduced and the abdomen explored.  Operative ports  were placed along the right costal margin in the midline, midclavicular  line and anterior axillary line.  Fundus of the gallbladder is grasped  and retracted cephalad.  Gallbladder is moderately to markedly  edematous.  There are a few adhesions.  Dissection has begun at the neck  of the gallbladder.  Cystic duct is dissected out.  Clip was placed at  the neck of the gallbladder.  Cystic duct is incised.  Mucoid material  emanates from the cystic duct.  A Cook cholangiography catheter was  introduced through a stab wound in the right upper quadrant and inserted  into the cystic duct.  It is secured with a ligature clip.  He required  repositioning once in order to achieve a  good cholangiogram.  Using the  C-arm fluoroscopy, real time cholangiography was performed.  There was  rapid filling of the normal caliber common bile duct with free flow of  contrast into the duodenum.  There is reflux of contrast into the right  and left hepatic ductal systems.  Clip was withdrawn and Cook catheter  was removed from the peritoneal cavity.  Cystic duct is triply clipped  and divided.  Cystic artery is dissected out, doubly clipped, and  divided.  Gallbladder is then excised from the gallbladder bed using the  hook electrocautery for hemostasis.  Gallbladder was completely excised  from the liver and placed into an EndoCatch bag.  It is withdrawn  through the umbilical port without difficulty and submitted to pathology  for review.  Then 0-Vicryl pursestring sutures were tied securely.  Right upper quadrant is irrigated with warm saline.   Good hemostasis was  noted.  Saline is evacuated.  Pneumoperitoneum was released.  Ports were  removed under direct vision and good hemostasis noted at all port sites.  Port sites were anesthetized with local anesthetic.  Wounds were closed  with interrupted 4-0 Vicryl subcuticular sutures.  Wounds are washed and  dried and Benzoin and Steri-Strips were applied.  Sterile dressings were  applied.  The patient is awakened from anesthesia and brought to the  recovery room in stable condition.  The patient tolerated the procedure  well.      Velora Heckler, MD  Electronically Signed     TMG/MEDQ  D:  01/23/2007  T:  01/24/2007  Job:  [id number]

## 2010-07-15 NOTE — Consult Note (Signed)
[name], [name]              ACCOUNT NO.:  [id number]   MEDICAL RECORD NO.:  [id number]          PATIENT TYPE:  EMS   LOCATION:  ED                           FACILITY:  [facility]   PHYSICIAN:  Wilmon Arms. Corliss Skains, M.D. DATE OF BIRTH:  13-Apr-1955   DATE OF CONSULTATION:  01/23/2007  DATE OF DISCHARGE:                                 CONSULTATION   CONSULTING PHYSICIAN:  Dr. Maurice March Molpus.   REASON FOR CONSULTATION:  Acute cholecystitis.   The patient is a 55 year old male who is in good health, who had a meal  consisting of a cheeseburger with onions around 11:00 p.m. last night.  He then went to bed and woke at 3:00 a.m. with severe upper abdominal  pain.  This localized to his right upper quadrant and was associated  with nausea and vomiting.  The pain radiated through to his back.  His  abdomen felt distended despite his emesis.  He presented to the  emergency department for evaluation.  He is still having a little bit of  soreness.   PAST MEDICAL HISTORY:  A history of gout.   PAST SURGICAL HISTORY:  1. Arthroscopic knee surgery.  2. Thumb surgery.   FAMILY HISTORY:  Noncontributory.   SOCIAL HISTORY:  Nonsmoker, nondrinker.   ALLERGIES:  None.   MEDICATIONS:  None.   REVIEW OF SYSTEMS:  Otherwise negative.   PHYSICAL EXAMINATION:  Temp 98.5, pulse 65, respirations 16, blood  pressure 115/59.  This is a well-developed, well-nourished African-American male in no  apparent distress.  HEENT:  EOMI.  Sclerae are anicteric.  NECK:  No mass or thyromegaly.  LUNGS:  Clear.  Normal respiratory effort.  HEART:  Regular rate and rhythm.  No murmur.  ABDOMEN:  The patient has some moderate right upper quadrant tenderness.  No palpable masses.  No rebound, no guarding.  EXTREMITIES:  No edema.  SKIN:  Warm and dry with no sign of jaundice.   Ultrasound showed cholelithiasis, thickened gallbladder wall.  No  pericholecystic fluid.  Normal common bile duct.  White count 13,  lipase  25.  Electrolytes within normal limits.  Total bilirubin 0.9.   IMPRESSION:  Acute cholecystitis.   PLAN:  I recommend admitting the patient for IV antibiotics and  laparoscopic cholecystectomy.   As I was explaining the situation to the patient and his wife, the  patient stated that he did not want to come into the hospital today for  an urgent surgery.  I told them that I highly recommended against going  home since he was still having symptoms and he had obvious cholecystitis  on physical examination, as well as ultrasound.  The patient stated  again that he did not want to be admitted today.  I warned him that he  would put himself at risk for recurrent episodes of pain with possible  pancreatitis or common bile duct obstruction.  He states that he  understands these risks.  We will discharge him home.  I gave him one  prescription for p.r.n. pain medication, Vicodin #40.  He needs to  follow up with  our office at [facility], [phone], to  schedule elective cholecystectomy.  I told him that when he did comes  for evaluation for elective cholecystectomy, it would not be done  immediately.  He would have to be on the elective schedule which could  take several weeks.  He should return to the emergency department if he  has any recurrence of his symptoms.  He agrees with this plan.  Once  again, I stated that this is not the standard of care and this is not  recommended, but since he insisted on going home,  I could not stop him  from making that decision.  His wife was part of the entire  conversation.      Wilmon Arms. Tsuei, M.D.  Electronically Signed     MKT/MEDQ  D:  01/23/2007  T:  01/24/2007  Job:  [id number]   cc:   Paula Libra, MD  Fax: [phone]
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
## 2010-12-09 LAB — POCT URINE HEMOGLOBIN
Hgb urine dipstick: POSITIVE — AB
Operator id: 29459

## 2010-12-09 LAB — BASIC METABOLIC PANEL
BUN: 17
CO2: 23
Calcium: 9.6
Chloride: 103
Creatinine, Ser: 1.23
GFR calc Af Amer: >60
GFR calc non Af Amer: >60
Glucose, Bld: 174 — ABNORMAL HIGH
Potassium: 3.8
Sodium: 139

## 2010-12-09 LAB — URINALYSIS, ROUTINE W REFLEX MICROSCOPIC
Bilirubin Urine: NEGATIVE
Glucose, UA: 100 — AB
Hgb urine dipstick: NEGATIVE
Ketones, ur: NEGATIVE
Nitrite: NEGATIVE
Protein, ur: NEGATIVE
Specific Gravity, Urine: 1.024
Urobilinogen, UA: 0.2
pH: 5.5

## 2010-12-09 LAB — CBC
HCT: 42.6
Hemoglobin: 14.9
MCHC: 34.9
MCV: 90.9
Platelets: 253
RBC: 4.69
RDW: 12.5
WBC: 13 — ABNORMAL HIGH

## 2010-12-09 LAB — DIFFERENTIAL
Basophils Absolute: 0
Basophils Relative: 0
Eosinophils Absolute: 0.1 — ABNORMAL LOW
Eosinophils Relative: 1
Lymphocytes Relative: 20
Lymphs Abs: 2.6
Monocytes Absolute: 0.5
Monocytes Relative: 4
Neutro Abs: 9.8 — ABNORMAL HIGH
Neutrophils Relative %: 76

## 2010-12-09 LAB — LIPASE, BLOOD: Lipase: 25

## 2010-12-09 LAB — HEPATIC FUNCTION PANEL
ALT: 24
AST: 21
Albumin: 4.4
Alkaline Phosphatase: 49
Bilirubin, Direct: 0.1
Indirect Bilirubin: 0.8
Total Bilirubin: 0.9
Total Protein: 7.2

## 2015-02-12 ENCOUNTER — Encounter (HOSPITAL_COMMUNITY): Payer: Self-pay | Admitting: Emergency Medicine

## 2015-02-12 ENCOUNTER — Emergency Department (HOSPITAL_COMMUNITY)
Admission: EM | Admit: 2015-02-12 | Discharge: 2015-02-12 | Disposition: A | Discharge status: Home / Self Care | Payer: Self-pay | Attending: Emergency Medicine | Admitting: Emergency Medicine

## 2015-02-12 DIAGNOSIS — Z79899 Other long term (current) drug therapy: Secondary | ICD-10-CM | POA: Insufficient documentation

## 2015-02-12 DIAGNOSIS — M2550 Pain in unspecified joint: Secondary | ICD-10-CM

## 2015-02-12 DIAGNOSIS — M25511 Pain in right shoulder: Secondary | ICD-10-CM | POA: Insufficient documentation

## 2015-02-12 DIAGNOSIS — Z9889 Other specified postprocedural states: Secondary | ICD-10-CM | POA: Insufficient documentation

## 2015-02-12 DIAGNOSIS — M25512 Pain in left shoulder: Secondary | ICD-10-CM | POA: Insufficient documentation

## 2015-02-12 LAB — CBC
HCT: 39.2 % (ref 39.0–52.0)
Hemoglobin: 13.2 g/dL (ref 13.0–17.0)
MCH: 30.2 pg (ref 26.0–34.0)
MCHC: 33.7 g/dL (ref 30.0–36.0)
MCV: 89.7 fL (ref 78.0–100.0)
Platelets: 409 10*3/uL — ABNORMAL HIGH (ref 150–400)
RBC: 4.37 MIL/uL (ref 4.22–5.81)
RDW: 11.7 % (ref 11.5–15.5)
WBC: 9.8 10*3/uL (ref 4.0–10.5)

## 2015-02-12 LAB — BASIC METABOLIC PANEL
Anion gap: 10 (ref 5–15)
BUN: 9 mg/dL (ref 6–20)
CO2: 25 mmol/L (ref 22–32)
Calcium: 9.5 mg/dL (ref 8.9–10.3)
Chloride: 103 mmol/L (ref 101–111)
Creatinine, Ser: 0.99 mg/dL (ref 0.61–1.24)
GFR calc Af Amer: >60 mL/min (ref >60)
GFR calc non Af Amer: >60 mL/min (ref >60)
Glucose, Bld: 140 mg/dL — ABNORMAL HIGH (ref 65–99)
Potassium: 4.2 mmol/L (ref 3.5–5.1)
Sodium: 138 mmol/L (ref 135–145)

## 2015-02-12 MED ORDER — ACETAMINOPHEN 325 MG PO TABS
650.0000 mg | ORAL_TABLET | Freq: Once | ORAL | Status: AC
  Administered 2015-02-12: 650 mg via ORAL
  Filled 2015-02-12: qty 2

## 2015-02-12 MED ORDER — IBUPROFEN 800 MG PO TABS: 800.0000 mg | ORAL_TABLET | Freq: Three times a day (TID) | ORAL | Status: DC

## 2015-02-12 MED ORDER — ACETAMINOPHEN 500 MG PO TABS: 500.0000 mg | ORAL_TABLET | Freq: Four times a day (QID) | ORAL | Status: DC | PRN

## 2015-02-12 MED ORDER — KETOROLAC TROMETHAMINE 60 MG/2ML IM SOLN
60.0000 mg | Freq: Once | INTRAMUSCULAR | Status: AC
  Administered 2015-02-12: 60 mg via INTRAMUSCULAR
  Filled 2015-02-12: qty 2

## 2015-02-12 NOTE — ED Provider Notes (Signed)
CSN: [id number]     Arrival date & time 02/12/15  [id number] History   First MD Initiated Contact with Patient 02/12/15 [phone]     Chief Complaint  Patient presents with  . Generalized Body Aches     (Consider location/radiation/quality/duration/timing/severity/associated sxs/prior Treatment) The history is provided by the patient.  [name] is a 59 y.o. male with PMH significant for rotator cuff repair and knee surgery who presents with constant, worsening, moderate generalized joint pain that began approximately 6 weeks ago.  When asked to locate the pain he points to his hands, shoulders, neck, and left knee.  He describes it as "pain in my joints" and aching.  He has tried ibuprofen with minimal relief.  No modifying factors.  Associated symptoms include bilateral weakness and fatigue.  Denies fever, chills, night sweats, weight loss, numbness, tingling, CP, SOB, N/V, abdominal pain, or urinary symptoms.  Denies recent injury, trauma, or fall.  He denies any chronic health issues and is on no medications.  He denies recent diet changes.   History reviewed. No pertinent past medical history. Past Surgical History  Procedure Laterality Date  . Rotator cuff repair    . Knee surgery     History reviewed. No pertinent family history. Social History  Substance Use Topics  . Smoking status: Never Smoker   . Smokeless tobacco: None  . Alcohol Use: No    Review of Systems All other systems negative unless otherwise stated in HPI    Allergies  Review of patient's allergies indicates no known allergies.  Home Medications   Prior to Admission medications   Medication Sig Start Date End Date Taking? Authorizing Provider  Multiple Vitamin (MULTIVITAMIN WITH MINERALS) TABS tablet Take 1 tablet by mouth daily.   Yes Historical Provider, MD  acetaminophen (TYLENOL) 500 MG tablet Take 1 tablet (500 mg total) by mouth every 6 (six) hours as needed. 02/12/15   Gloriann Loan, PA-C  ibuprofen  (ADVIL,MOTRIN) 800 MG tablet Take 1 tablet (800 mg total) by mouth 3 (three) times daily. 02/12/15   Louretta Tantillo, PA-C   BP 141/82 mmHg  Pulse 73  Temp(Src) 97.9 F (36.6 C) (Oral)  Resp 16  SpO2 94% Physical Exam  Constitutional: He is oriented to person, place, and time. He appears well-developed and well-nourished.  HENT:  Head: Normocephalic and atraumatic.  Mouth/Throat: Oropharynx is clear and moist.  Eyes: Conjunctivae are normal. Pupils are equal, round, and reactive to light.  Neck: Normal range of motion. Neck supple.  Cardiovascular: Normal rate, regular rhythm, normal heart sounds and intact distal pulses.   No murmur heard. Pulses:      Radial pulses are 2+ on the right side, and 2+ on the left side.       Posterior tibial pulses are 2+ on the right side, and 2+ on the left side.  Pulmonary/Chest: Effort normal and breath sounds normal. No accessory muscle usage or stridor. No respiratory distress. He has no wheezes. He has no rhonchi. He has no rales.  Abdominal: Soft. Bowel sounds are normal. He exhibits no distension. There is no tenderness.  Musculoskeletal: He exhibits no tenderness.  Decreased AROM of shoulders bilaterally secondary to pain.  FPROM of shoulders bilaterally.  No swelling, tenderness, erythema, or ecchymosis. No cervical/thoracic/lumbar midline tenderness.  Lymphadenopathy:    He has no cervical adenopathy.  Neurological: He is alert and oriented to person, place, and time.  Speech clear without dysarthria.  5/5 strength throughout upper and lower extremities bilaterally.  Sensation intact bilaterally throughout upper and lower extremities.  Skin: Skin is warm and dry.  Psychiatric: He has a normal mood and affect. His behavior is normal.    ED Course  Procedures (including critical care time) Labs Review Labs Reviewed  BASIC METABOLIC PANEL - Abnormal; Notable for the following:    Glucose, Bld 140 (*)    All other components within normal  limits  CBC - Abnormal; Notable for the following:    Platelets 409 (*)    All other components within normal limits    Imaging Review No results found. I have personally reviewed and evaluated these images and lab results as part of my medical decision-making.   EKG Interpretation None      MDM   Final diagnoses:  Joint pain    Patient presents with generalized joint pain for the past 6 weeks.  No fevers, weight loss, CP, SOB, abdominal pain. No injury, trauma, or fall.  VSS, NAD.  Strength and sensation intact bilaterally throughout upper and lower extremities.  Decreased AROM of shoulders bilaterally secondary to pain. Will give tylenol for pain and toradol.  Labs unremarkable. Patient stable for discharge.  Discussed findings with patient and importance of primary care follow up.  Will d/c home with tylenol and motrin along with resource guide. Case has been discussed with Dr. Regenia Skeeter who agrees with the above plan for discharge.      Gloriann Loan, PA-C 02/12/15 [id number]  Sherwood Gambler, MD 02/14/15 [phone]

## 2015-02-12 NOTE — ED Notes (Signed)
Patient with generalized body pain that started after he turned 59 in October.  Patient states that the pain is sharp in nature, like someone is stabbing him in his arms and into his legs.

## 2015-02-12 NOTE — Discharge Instructions (Signed)
Joint Pain Joint pain, which is also called arthralgia, can be caused by many things. Joint pain often goes away when you follow your health care provider's instructions for relieving pain at home. However, joint pain can also be caused by conditions that require further treatment. Common causes of joint pain include:  Bruising in the area of the joint.  Overuse of the joint.  Wear and tear on the joints that occur with aging (osteoarthritis).  Various other forms of arthritis.  A buildup of a crystal form of uric acid in the joint (gout).  Infections of the joint (septic arthritis) or of the bone (osteomyelitis). Your health care provider may recommend medicine to help with the pain. If your joint pain continues, additional tests may be needed to diagnose your condition. HOME CARE INSTRUCTIONS Watch your condition for any changes. Follow these instructions as directed to lessen the pain that you are feeling.  Take medicines only as directed by your health care provider.  Rest the affected area for as long as your health care provider says that you should. If directed to do so, raise the painful joint above the level of your heart while you are sitting or lying down.  Do not do things that cause or worsen pain.  If directed, apply ice to the painful area:  Put ice in a plastic bag.  Place a towel between your skin and the bag.  Leave the ice on for 20 minutes, 2-3 times per day.  Wear an elastic bandage, splint, or sling as directed by your health care provider. Loosen the elastic bandage or splint if your fingers or toes become numb and tingle, or if they turn cold and blue.  Begin exercising or stretching the affected area as directed by your health care provider. Ask your health care provider what types of exercise are safe for you.  Keep all follow-up visits as directed by your health care provider. This is important. SEEK MEDICAL CARE IF:  Your pain increases, and medicine  does not help.  Your joint pain does not improve within 3 days.  You have increased bruising or swelling.  You have a fever.  You lose 10 lb (4.5 kg) or more without trying. SEEK IMMEDIATE MEDICAL CARE IF:  You are not able to move the joint.  Your fingers or toes become numb or they turn cold and blue.   This information is not intended to replace advice given to you by your health care provider. Make sure you discuss any questions you have with your health care provider.   Document Released: 02/16/2005 Document Revised: 03/09/2014 Document Reviewed: 11/28/2013 Elsevier Interactive Patient Education 2016 [employer].   Emergency Department Resource Guide 1) Find a Doctor and Pay Out of Pocket Although you won't have to find out who is covered by your insurance plan, it is a good idea to ask around and get recommendations. You will then need to call the office and see if the doctor you have chosen will accept you as a new patient and what types of options they offer for patients who are self-pay. Some doctors offer discounts or will set up payment plans for their patients who do not have insurance, but you will need to ask so you aren't surprised when you get to your appointment.  2) Contact Your Local Health Department Not all health departments have doctors that can see patients for sick visits, but many do, so it is worth a call to see if yours  does. If you don't know where your local health department is, you can check in your phone book. The CDC also has a tool to help you locate your state's health department, and many state websites also have listings of all of their local health departments.  3) Find a [facility] If your illness is not likely to be very severe or complicated, you may want to try a walk in clinic. These are popping up all over the country in pharmacies, drugstores, and shopping centers. They're usually staffed by nurse practitioners or physician assistants  that have been trained to treat common illnesses and complaints. They're usually fairly quick and inexpensive. However, if you have serious medical issues or chronic medical problems, these are probably not your best option.  No Primary Care Doctor: - Call Health Connect at  [phone] - they can help you locate a primary care doctor that  accepts your insurance, provides certain services, etc. - Physician Referral Service- [phone]  Chronic Pain Problems: Organization         Address  Phone   Notes  [facility]  [phone] Patients need to be referred by their primary care doctor.   Medication Assistance: Organization         Address  Phone   Notes  [facility] Medication [facility] [address] [place], [place] [address] [phone] --Must be a resident of [facility] -- Must have NO insurance coverage whatsoever (no Medicaid/ Medicare, etc.) -- The pt. MUST have a primary care doctor that directs their care regularly and follows them in the community   MedAssist  [phone]   [employer]  [phone]    Agencies that provide inexpensive medical care: Organization         Address  Phone   Notes  [place]  [phone]   [facility]    [phone]   [facility] [place], [place] [address] [phone]   [place] [address], [place] [phone]   Planned Parenthood    [phone]   [facility]    [phone]   [place] and [place] [address], [place] Phone:  [phone], Fax:  [phone] Hours of Operation:  9 am - 6 pm, M-F.  Also accepts Medicaid/Medicare and self-pay.  [facility] for [place], [address], [place] Phone: [phone], Fax: [phone]. Hours of Operation:  8:30 am - 5:30 pm, M-F.  Also accepts Medicaid  and self-pay.  [facility] [place] [address], [place] Phone: [phone]   [place], [place], [place] [phone], Ext. [phone] Mondays & Thursdays: 7-9 AM.  First 15 patients are seen on a first come, first serve basis.    [place] Providers:  Organization         Address  Phone   Notes  [facility] [address], [place] [phone] Also accepts self-pay patients.  [place], [place]  [phone]   [place], [address], [place] [phone]   [facility] [address], [place] [phone]   Lucianne Lei [address], [place]   [phone]  Only accepts [place] patients after they have their name applied to their card.   Self-Pay (no insurance) in [facility]:  Organization         Address  Phone   Notes  Sickle Cell Patients, [facility] Internal [facility] [place] [phone]   [facility]gent Care [place] [phone]   [facility] [place]  [place], [place], [place] [phone]   [facility]/Dr. Osei-Bonsu  [address], [place] or [address], [place] [phone] Phone number for both [place] and [place] locations is the same.  [facility]gent [facility] and [facility] [address], [place] [phone]   [facility] [address], [place] or [address] [phone]   [facility] [address] [place] [phone], phone; [phone], fax Sees patients 1st and 3rd Saturday of every month.  Must not qualify for public or private insurance (i.e. Medicaid, Medicare, [place] Health Choice, Veterans' Benefits)  Household income  should be no more than 200% of the poverty level The clinic cannot treat you if you are pregnant or think you are pregnant  Sexually transmitted diseases are not treated at the clinic.    Dental Care: Organization         Address  Phone  Notes  [facility] [place] [phone] Accepts children up to age 22 who are enrolled in [place] or [place]; pregnant women with a Medicaid card; and children who have applied for Medicaid or [place] Health Choice, but were declined, whose parents can pay a reduced fee at time of service.  [facility] of [facility]  [address], [place] [phone] Accepts children up to age 70 who are enrolled in [place] or [place]; pregnant women with a Medicaid card; and children who have applied for Medicaid or [place] Health Choice, but were declined, whose parents can pay a reduced fee at time of service.  [place] Adult Dental Access PROGRAM  [place] [phone] Patients are seen by appointment only. Walk-ins are not accepted. [place] will see patients 39 years of age and older. Monday - Tuesday (8am-5pm) Most Wednesdays (8:30-5pm) $30 per visit, cash only  [facility] Adult Dental Access PROGRAM  [address], [facility] [phone] Patients are seen by appointment only. Walk-ins are not accepted. [place] will see patients 62 years of age and older. One Wednesday Evening (Monthly: Volunteer Based).  $30 per visit, cash only  [place]  [phone] for adults; Children under age 4, call Graduate Pediatric Dentistry at [phone]. Children aged 62-14, please call [phone] to request a pediatric application.  Dental services are provided in all areas of dental care including fillings, crowns and bridges, complete and partial dentures, implants, gum treatment,  root canals, and extractions. Preventive care is also provided. Treatment is provided to both adults and children. Patients are selected via a lottery and there is often a waiting list.   [facility] [address], Center  [phone] [email or web address], [place], [place] [phone], Ext. [phone] Second and Fourth Thursday of each month, opens at 6:30 AM; Clinic  ends at 9 AM.  Patients are seen on a first-come first-served basis, and a limited number are seen during each clinic.   [facility]  [address] Hillard Danker [place], [place] [phone]   Eligibility Requirements You must have lived in [place], [place], or [place] counties for at least the last three months.   You cannot be eligible for state or federal sponsored [employer], including [employer], [place], or [employer].   You generally cannot be eligible for healthcare insurance through your employer.    How to apply: Eligibility screenings are held every Tuesday and Wednesday afternoon from 1:00 pm until 4:00 pm. You do not need an appointment for the interview!  [facility] [address], [place], [place]   [place]  [place] Department  [employer]  [phone]    Behavioral Health Resources in the Community: Intensive Outpatient Programs Organization         Address  Phone  Notes  [place]. [address] [place], [place] [phone]   [facility] Outpatient [address] [place], [place]   ADS: Alcohol & Drug Svcs [address] [place], [place]   [place] [address]  [place], [place] or [phone]   Substance Abuse Resources Organization         Address  Phone  Notes  Alcohol and Drug Services   [phone]   [place]  [phone]   [facility] [place]   Chinita Pester  [phone]   Residential & Outpatient Substance Abuse Program  [phone]   Psychological Services Organization         Address  Phone  Notes  [facility] [place]  [place]  [phone]   [place] [address], [place] or [phone]    Mobile Crisis Teams Organization         Address  Phone  Notes  [facility], Mobile Crisis Care Unit  [phone]   [facility]  [address] [place], [place]   [place] [address], [place] [phone]    Self-Help/Support Groups Organization         Address  Phone             Notes  [place]. of [place] - variety of support groups  [place] Call for more information  Narcotics Anonymous (NA), [facility] [address], [employer] [place]  2 meetings at this location   Special educational needs teacher         Address  Phone  Notes  ASAP Residential Treatment [place],    [place]  [phone]   [facility]  [address] [place] [phone], [place], [place]   [place], [place] [phone] Admissions: 8am-3pm M-F  Incentives Substance [place] [phone],    [place], [place] [phone]   [facility] [address] Jadene Pierini [place], [place]   [facility] [address],  [place], [place]   [facility] - Intensive Outpatient [address] Krist[address], [place], [place]   [facility] [address] [place] [address] Madelaine Bhat  [place], [place] or [phone]   Residential  Treatment Services (RTS) [address], [place], [place] Accepts Medicaid  [facility] [place] [address],  [place] [phone] Substance Abuse/Addiction Treatment   [facility] Organization         Address  Phone  Notes  [facility]  [phone]   Domenic Schwab, PhD [address] Arlis [place], [place]   [phone] or [phone]   [place], [place] [phone]   [address], [place], [place] [phone] Insurance/Medicaid/sponsorship through [facility] and Families [address], [address] [place]                                    [place], [place] [phone] [place] [address] [place], [place] [phone]    Dr. Adele Schilder  [phone]   [facility] Dept. 1) [address] [place] 2) [place] 3)  [place] [address], [place] [phone]  [phone]   [place] [phone] or [phone] (After Hours)

## 2015-03-08 ENCOUNTER — Ambulatory Visit (INDEPENDENT_AMBULATORY_CARE_PROVIDER_SITE_OTHER): Payer: Self-pay | Admitting: Internal Medicine

## 2015-03-08 ENCOUNTER — Encounter: Payer: Self-pay | Admitting: Internal Medicine

## 2015-03-08 VITALS — BP 144/72 | HR 99 | Temp 98.8°F | Ht 67.0 in | Wt 194.0 lb

## 2015-03-08 DIAGNOSIS — M130 Polyarthritis, unspecified: Secondary | ICD-10-CM | POA: Insufficient documentation

## 2015-03-08 DIAGNOSIS — Z0000 Encounter for general adult medical examination without abnormal findings: Secondary | ICD-10-CM

## 2015-03-08 MED ORDER — RANITIDINE HCL 75 MG PO TABS: 150.0000 mg | ORAL_TABLET | Freq: Two times a day (BID) | ORAL | Status: DC

## 2015-03-08 MED ORDER — IBUPROFEN 800 MG PO TABS: 800.0000 mg | ORAL_TABLET | Freq: Two times a day (BID) | ORAL | Status: DC

## 2015-03-08 NOTE — Assessment & Plan Note (Addendum)
Patient reports >1 month history of worsening joint pain involving hands, arms, shoulders, hips, knees, and right ankle.  He first noticed the pain in his left hand, described as "tight" and associated with sharp pains in the fingers.  He noticed some swelling of the hand, but denies erythema, numbness, or tingling.  He reports reduced grip strength.   He then noticed the pain move to his right shoulder, then left shoulder, then both shoulders, then left hip, then right hip, then knees, and then right ankle.  He has been taking 200 mg Ibuprofen up to three times daily for pain.  He was seen in the ED in December for similar complaints, where he was prescribed Ibuprofen 800 mg tabs. However, the patient has been breaking them in half and only taking half tab once daily if needed.  He notes moderate relief with NSAIDs and minimal relief with [place] or Icy Hot.   The pains are worse in the morning and improve through the day. However, they are worse again at night before bed.  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  He previously played football.  He has had rotator cuff repair of the left shoulder and right knee surgery.   He denies fever, chills, or night sweats.  He endorses 15 lb weight loss over the last 1.5 years due to grief from his wife dying of breast cancer.  On exam, he has good muscle bulk and tone throughout.  Decreased active ROM symmetrically in shoulders limited by pain.  Normal passive ROM.  Positive Neer test.  Strength 5/5 in bilateral UE, except grip strength of 4-/5.  Negative Tinel's.  No thenar atrophy. Right hip with pain on internal rotation.  Strength 5/5 in bilateral LE.  No swelling or erythema.  A/P: Polyarthritis, likely OA > Polymyalgia rheumatica > RA, SLE, pseudogout.  Patient has been undertreated with Ibuprofen due to personal concern for GI upset.  Will continue conservative management and check ESR for underlying inflammatory disorder.  Will withhold  further workup (Xrays, RF, etc) if possibly until patient has insurance or orange card. [ ] ESR - Ibuprofen 800 mg BID - Zantac 150 mg BID - RTC 1 month    Addendum 03/13/2015: ESR elevated at 48.  Patient called and told of results.  Dx now more c/w polymyalgia rheumatica.  Will treat with Prednisone 15 mg daily and recheck in 2 weeks.  Will schedule patient for appointment on 1/27. - Prednisone 15 mg daily - RTC 2 weeks. 

## 2015-03-08 NOTE — Progress Notes (Signed)
Patient ID: [name], male   DOB: [birth year], 60 y.o.   MRN: [id number]    Subjective:   Patient ID: [name] male   DOB: April 17, 1955 60 y.o.   MRN: [id number]  HPI: [name] is a 60 y.o. male with PMH as below, here to establish care and for evaluation of multiple joint pains.  Please see Problem-Based charting for the status of the patient's chronic medical issues.  PMH: No history of HTN, DMII, HLD, or MI. PSH: Cholecystectomy 06/30/05), rotator cuff repair (1980s), knee surgery (1970s)  Allergies: NKDA FHx: mother died of breast cancer, son died of liver cancer 07-01-2011), 2 brothers with MI, one brother with DMII.  No FHx of HTN  SHx: He lives alone. He is currently retired after working in [employer] for 30 years. 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  He has never smoked. He does not drink or use illicit drugs.   No past medical history on file. Current Outpatient Prescriptions  Medication Sig Dispense Refill  . acetaminophen (TYLENOL) 500 MG tablet Take 1 tablet (500 mg total) by mouth every 6 (six) hours as needed. 30 tablet 0  . ibuprofen (ADVIL,MOTRIN) 800 MG tablet Take 1 tablet (800 mg total) by mouth 3 (three) times daily. 21 tablet 0  . Multiple Vitamin (MULTIVITAMIN WITH MINERALS) TABS tablet Take 1 tablet by mouth daily.     No current facility-administered medications for this visit.   No family history on file. Social History   Social History  . Marital Status: Married    Spouse Name: N/A  . Number of Children: N/A  . Years of Education: N/A   Social History Main Topics  . Smoking status: Never Smoker   . Smokeless tobacco: Not on file  . Alcohol Use: No  . Drug Use: No  . Sexual Activity: Not on file   Other Topics Concern  . Not on file   Social History Narrative  . No narrative on file   Review of Systems: A 10 point review of systems was negative except for: cough, runny nose, congestion, and 20 lb weight loss over the last  1.5 years.  Objective:  Physical Exam: There were no vitals filed for this visit. Physical Exam  Constitutional: He is oriented to person, place, and time and well-developed, well-nourished, and in no distress. No distress.  HENT:  Head: Normocephalic and atraumatic.  Eyes: EOM are normal. Pupils are equal, round, and reactive to light. No scleral icterus.  Neck: Normal range of motion. No JVD present. No tracheal deviation present.  Cardiovascular: Normal rate, regular rhythm, normal heart sounds and intact distal pulses.   Pulmonary/Chest: Effort normal. No stridor. No respiratory distress. He has no wheezes.  Scattered coarse inspiratory breath sounds.  Abdominal: Soft. He exhibits no distension. There is no tenderness. There is no rebound and no guarding.  Musculoskeletal:  Good muscle bulk and tone throughout.  Decreased active ROM symmetrically in shoulders limited by pain.  Normal passive ROM.  Positive Neer test.  Strength 5/5 in bilateral UE, except grip strength of 4-/5.  Negative Tinel's.  No thenar atrophy. Right hip with pain on internal rotation.  Strength 5/5 in bilateral LE.  No swelling or erythema.   Neurological: He is alert and oriented to person, place, and time.  Skin: Skin is warm and dry. No rash noted. He is not diaphoretic.     Assessment & Plan:    Patient and case were discussed with Dr. Daryll Drown.  Please  refer to Problem Based charting for further documentation.

## 2015-03-08 NOTE — Assessment & Plan Note (Signed)
Patient has not had a PCP and has only sought medical care at urgent care or ED.  Lab work from ED shows elevated glucose, but unclear whether patient had eaten. He had a colonoscopy in the 80s, but has not had once since.   He is also still grieving over the death of his wife in 06/09/12 from breast cancer.  Patient will need further healthcare maintenance at follow up.  A/P: - Needs colonoscopy - Needs fasting glucose - Further discussion regarding grief over wife.

## 2015-03-08 NOTE — Patient Instructions (Signed)
1. Take Ibuprofen 800 mg twice daily with food. 2. Take Zantac (Ranitidine) 150 mg twice daily before the Ibuprofen. 3. Return to clinic in 1 month.  Ibuprofen tablets and capsules What is this medicine? IBUPROFEN (eye BYOO proe fen) is a non-steroidal anti-inflammatory drug (NSAID). It is used for dental pain, fever, headaches or migraines, osteoarthritis, rheumatoid arthritis, or painful monthly periods. It can also relieve minor aches and pains caused by a cold, flu, or sore throat. This medicine may be used for other purposes; ask your health care provider or pharmacist if you have questions. What should I tell my health care provider before I take this medicine? They need to know if you have any of these conditions: -asthma -cigarette smoker -drink more than 3 alcohol containing drinks a day -heart disease or circulation problems such as heart failure or leg edema (fluid retention) -high blood pressure -kidney disease -liver disease -stomach bleeding or ulcers -an unusual or allergic reaction to ibuprofen, aspirin, other NSAIDS, other medicines, foods, dyes, or preservatives -pregnant or trying to get pregnant -breast-feeding How should I use this medicine? Take this medicine by mouth with a glass of water. Follow the directions on the prescription label. Take this medicine with food if your stomach gets upset. Try to not lie down for at least 10 minutes after you take the medicine. Take your medicine at regular intervals. Do not take your medicine more often than directed. A special MedGuide will be given to you by the pharmacist with each prescription and refill. Be sure to read this information carefully each time. Talk to your pediatrician regarding the use of this medicine in children. Special care may be needed. Overdosage: If you think you have taken too much of this medicine contact a poison control center or emergency room at once. NOTE: This medicine is only for you. Do not  share this medicine with others. What if I miss a dose? If you miss a dose, take it as soon as you can. If it is almost time for your next dose, take only that dose. Do not take double or extra doses. What may interact with this medicine? Do not take this medicine with any of the following medications: -cidofovir -ketorolac -methotrexate -pemetrexed This medicine may also interact with the following medications: -alcohol -aspirin -diuretics -lithium -other drugs for inflammation like prednisone -warfarin This list may not describe all possible interactions. Give your health care provider a list of all the medicines, herbs, non-prescription drugs, or dietary supplements you use. Also tell them if you smoke, drink alcohol, or use illegal drugs. Some items may interact with your medicine. What should I watch for while using this medicine? Tell your doctor or healthcare professional if your symptoms do not start to get better or if they get worse. This medicine does not prevent heart attack or stroke. In fact, this medicine may increase the chance of a heart attack or stroke. The chance may increase with longer use of this medicine and in people who have heart disease. If you take aspirin to prevent heart attack or stroke, talk with your doctor or health care professional. Do not take other medicines that contain aspirin, ibuprofen, or naproxen with this medicine. Side effects such as stomach upset, nausea, or ulcers may be more likely to occur. Many medicines available without a prescription should not be taken with this medicine. This medicine can cause ulcers and bleeding in the stomach and intestines at any time during treatment. Ulcers and bleeding can  happen without warning symptoms and can cause death. To reduce your risk, do not smoke cigarettes or drink alcohol while you are taking this medicine. You may get drowsy or dizzy. Do not drive, use machinery, or do anything that needs mental  alertness until you know how this medicine affects you. Do not stand or sit up quickly, especially if you are an older patient. This reduces the risk of dizzy or fainting spells. This medicine can cause you to bleed more easily. Try to avoid damage to your teeth and gums when you brush or floss your teeth. This medicine may be used to treat migraines. If you take migraine medicines for 10 or more days a month, your migraines may get worse. Keep a diary of headache days and medicine use. Contact your healthcare professional if your migraine attacks occur more frequently. What side effects may I notice from receiving this medicine? Side effects that you should report to your doctor or health care professional as soon as possible: -allergic reactions like skin rash, itching or hives, swelling of the face, lips, or tongue -severe stomach pain -signs and symptoms of bleeding such as bloody or black, tarry stools; red or dark-brown urine; spitting up blood or brown material that looks like coffee grounds; red spots on the skin; unusual bruising or bleeding from the eye, gums, or nose -signs and symptoms of a blood clot such as changes in vision; chest pain; severe, sudden headache; trouble speaking; sudden numbness or weakness of the face, arm, or leg -unexplained weight gain or swelling -unusually weak or tired -yellowing of eyes or skin Side effects that usually do not require medical attention (report to your doctor or health care professional if they continue or are bothersome): -bruising -diarrhea -dizziness, drowsiness -headache -nausea, vomiting This list may not describe all possible side effects. Call your doctor for medical advice about side effects. You may report side effects to FDA at 1-800-FDA-1088. Where should I keep my medicine? Keep out of the reach of children. Store at room temperature between 15 and 30 degrees C (59 and 86 degrees F). Keep container tightly closed. Throw away any  unused medicine after the expiration date. NOTE: This sheet is a summary. It may not cover all possible information. If you have questions about this medicine, talk to your doctor, pharmacist, or health care provider.    2016, Elsevier/Gold Standard. (2012-10-18 10:48:02) Osteoarthritis Osteoarthritis is a disease that causes soreness and inflammation of a joint. It occurs when the cartilage at the affected joint wears down. Cartilage acts as a cushion, covering the ends of bones where they meet to form a joint. Osteoarthritis is the most common form of arthritis. It often occurs in older people. The joints affected most often by this condition include those in the:  Ends of the fingers.  Thumbs.  Neck.  Lower back.  Knees.  Hips. CAUSES  Over time, the cartilage that covers the ends of bones begins to wear away. This causes bone to rub on bone, producing pain and stiffness in the affected joints.  RISK FACTORS Certain factors can increase your chances of having osteoarthritis, including:  Older age.  Excessive body weight.  Overuse of joints.  Previous joint injury. SIGNS AND SYMPTOMS   Pain, swelling, and stiffness in the joint.  Over time, the joint may lose its normal shape.  Small deposits of bone (osteophytes) may grow on the edges of the joint.  Bits of bone or cartilage can break off and float  inside the joint space. This may cause more pain and damage. DIAGNOSIS  Your health care provider will do a physical exam and ask about your symptoms. Various tests may be ordered, such as:  X-rays of the affected joint.  Blood tests to rule out other types of arthritis. Additional tests may be used to diagnose your condition. TREATMENT  Goals of treatment are to control pain and improve joint function. Treatment plans may include:  A prescribed exercise program that allows for rest and joint relief.  A weight control plan.  Pain relief techniques, such  as:  Properly applied heat and cold.  Electric pulses delivered to nerve endings under the skin (transcutaneous electrical nerve stimulation [TENS]).  Massage.  Certain nutritional supplements.  Medicines to control pain, such as:  Acetaminophen.  Nonsteroidal anti-inflammatory drugs (NSAIDs), such as naproxen.  Narcotic or central-acting agents, such as tramadol.  Corticosteroids. These can be given orally or as an injection.  Surgery to reposition the bones and relieve pain (osteotomy) or to remove loose pieces of bone and cartilage. Joint replacement may be needed in advanced states of osteoarthritis. HOME CARE INSTRUCTIONS   Take medicines only as directed by your health care provider.  Maintain a healthy weight. Follow your health care provider's instructions for weight control. This may include dietary instructions.  Exercise as directed. Your health care provider can recommend specific types of exercise. These may include:  Strengthening exercises. These are done to strengthen the muscles that support joints affected by arthritis. They can be performed with weights or with exercise bands to add resistance.  Aerobic activities. These are exercises, such as brisk walking or low-impact aerobics, that get your heart pumping.  Range-of-motion activities. These keep your joints limber.  Balance and agility exercises. These help you maintain daily living skills.  Rest your affected joints as directed by your health care provider.  Keep all follow-up visits as directed by your health care provider. SEEK MEDICAL CARE IF:   Your skin turns red.  You develop a rash in addition to your joint pain.  You have worsening joint pain.  You have a fever along with joint or muscle aches. SEEK IMMEDIATE MEDICAL CARE IF:  You have a significant loss of weight or appetite.  You have night sweats. [place] of Arthritis and Musculoskeletal and  Skin Diseases: www.niams.SouthExposed.es  [employer] on Aging: http://kim-miller.com/  American College of Rheumatology: www.rheumatology.org   This information is not intended to replace advice given to you by your health care provider. Make sure you discuss any questions you have with your health care provider.   Document Released: 02/16/2005 Document Revised: 03/09/2014 Document Reviewed: 10/24/2012 Elsevier Interactive Patient Education [employer].

## 2015-03-09 LAB — SEDIMENTATION RATE: Sed Rate: 48 mm/h — ABNORMAL HIGH (ref 0–30)

## 2015-03-11 ENCOUNTER — Other Ambulatory Visit: Payer: Self-pay | Admitting: Internal Medicine

## 2015-03-11 NOTE — Progress Notes (Signed)
Internal Medicine Clinic Attending  Case discussed with Dr. Taylor at the time of the visit.  We reviewed the resident's history and exam and pertinent patient test results.  I agree with the assessment, diagnosis, and plan of care documented in the resident's note. 

## 2015-03-13 MED ORDER — PREDNISONE 5 MG PO TABS
15.0000 mg | ORAL_TABLET | Freq: Every day | ORAL | Status: DC
Start: 2015-03-13 — End: 2015-04-17

## 2015-03-13 NOTE — Addendum Note (Signed)
Addended by: Viviano Simas A on: 03/13/2015 05:12 PM   Modules accepted: Orders

## 2015-03-29 ENCOUNTER — Encounter: Payer: Self-pay | Admitting: Internal Medicine

## 2015-04-01 ENCOUNTER — Telehealth: Payer: Self-pay | Admitting: Internal Medicine

## 2015-04-01 NOTE — Telephone Encounter (Signed)
NO ANSWER/NOT ABLE TO LEAVE MESSAGE °

## 2015-04-02 ENCOUNTER — Encounter: Payer: Self-pay | Admitting: Internal Medicine

## 2015-04-12 ENCOUNTER — Other Ambulatory Visit: Payer: Self-pay | Admitting: Internal Medicine

## 2015-04-12 DIAGNOSIS — M130 Polyarthritis, unspecified: Secondary | ICD-10-CM

## 2015-04-12 NOTE — Telephone Encounter (Signed)
NEEDS REFILL PREDNISONE 5 MG, [facility] [address]

## 2015-04-15 NOTE — Telephone Encounter (Signed)
Unable to reach patient.  Message to front desk to make appointment.

## 2015-04-15 NOTE — Telephone Encounter (Signed)
Patient needs an appointment. Was supposed to come at the end of January for reassessment. Will likely need to start slow taper of prednisone.

## 2015-04-16 NOTE — Telephone Encounter (Signed)
Sent letter to have pt call us

## 2015-04-16 NOTE — Telephone Encounter (Signed)
Msg when you use phone number listed says caller unavailable right now.  Pt is scheduled for March 15th but I wanted to see if he would come sooner since he told front office he was swelling without the prednisone.

## 2015-04-17 ENCOUNTER — Other Ambulatory Visit: Payer: Self-pay | Admitting: Internal Medicine

## 2015-04-17 ENCOUNTER — Encounter: Payer: Self-pay | Admitting: Internal Medicine

## 2015-04-17 DIAGNOSIS — M130 Polyarthritis, unspecified: Secondary | ICD-10-CM

## 2015-04-17 MED ORDER — PREDNISONE 5 MG PO TABS: 10.0000 mg | ORAL_TABLET | Freq: Every day | ORAL | Status: DC

## 2015-04-22 ENCOUNTER — Telehealth: Payer: Self-pay | Admitting: Internal Medicine

## 2015-04-22 NOTE — Telephone Encounter (Signed)
APPT. REMINDER CALL, NO ANSWER, NO VOICE MAIL °

## 2015-04-23 ENCOUNTER — Other Ambulatory Visit: Payer: Self-pay | Admitting: Internal Medicine

## 2015-04-23 ENCOUNTER — Ambulatory Visit (HOSPITAL_COMMUNITY)
Admission: RE | Admit: 2015-04-23 | Discharge: 2015-04-23 | Disposition: A | Discharge status: Home / Self Care | Payer: Self-pay | Source: Ambulatory Visit | Attending: Internal Medicine | Admitting: Internal Medicine

## 2015-04-23 ENCOUNTER — Encounter: Payer: Self-pay | Admitting: Internal Medicine

## 2015-04-23 ENCOUNTER — Ambulatory Visit (INDEPENDENT_AMBULATORY_CARE_PROVIDER_SITE_OTHER): Payer: Self-pay | Admitting: Internal Medicine

## 2015-04-23 VITALS — BP 162/80 | HR 92 | Temp 99.0°F | Wt 197.6 lb

## 2015-04-23 DIAGNOSIS — Z6830 Body mass index (BMI) 30.0-30.9, adult: Secondary | ICD-10-CM

## 2015-04-23 DIAGNOSIS — N529 Male erectile dysfunction, unspecified: Secondary | ICD-10-CM | POA: Insufficient documentation

## 2015-04-23 DIAGNOSIS — M11241 Other chondrocalcinosis, right hand: Secondary | ICD-10-CM | POA: Insufficient documentation

## 2015-04-23 DIAGNOSIS — M130 Polyarthritis, unspecified: Secondary | ICD-10-CM

## 2015-04-23 DIAGNOSIS — Z0000 Encounter for general adult medical examination without abnormal findings: Secondary | ICD-10-CM

## 2015-04-23 DIAGNOSIS — E663 Overweight: Secondary | ICD-10-CM

## 2015-04-23 IMAGING — DX DG SHOULDER 2+V*L*
3 series · 3 of 3 positions shown · non-contrast
Comparison: None.

CLINICAL DATA: Pain and swelling of the joints

EXAM:
LEFT SHOULDER - 2+ VIEW

[shoulder grashey]
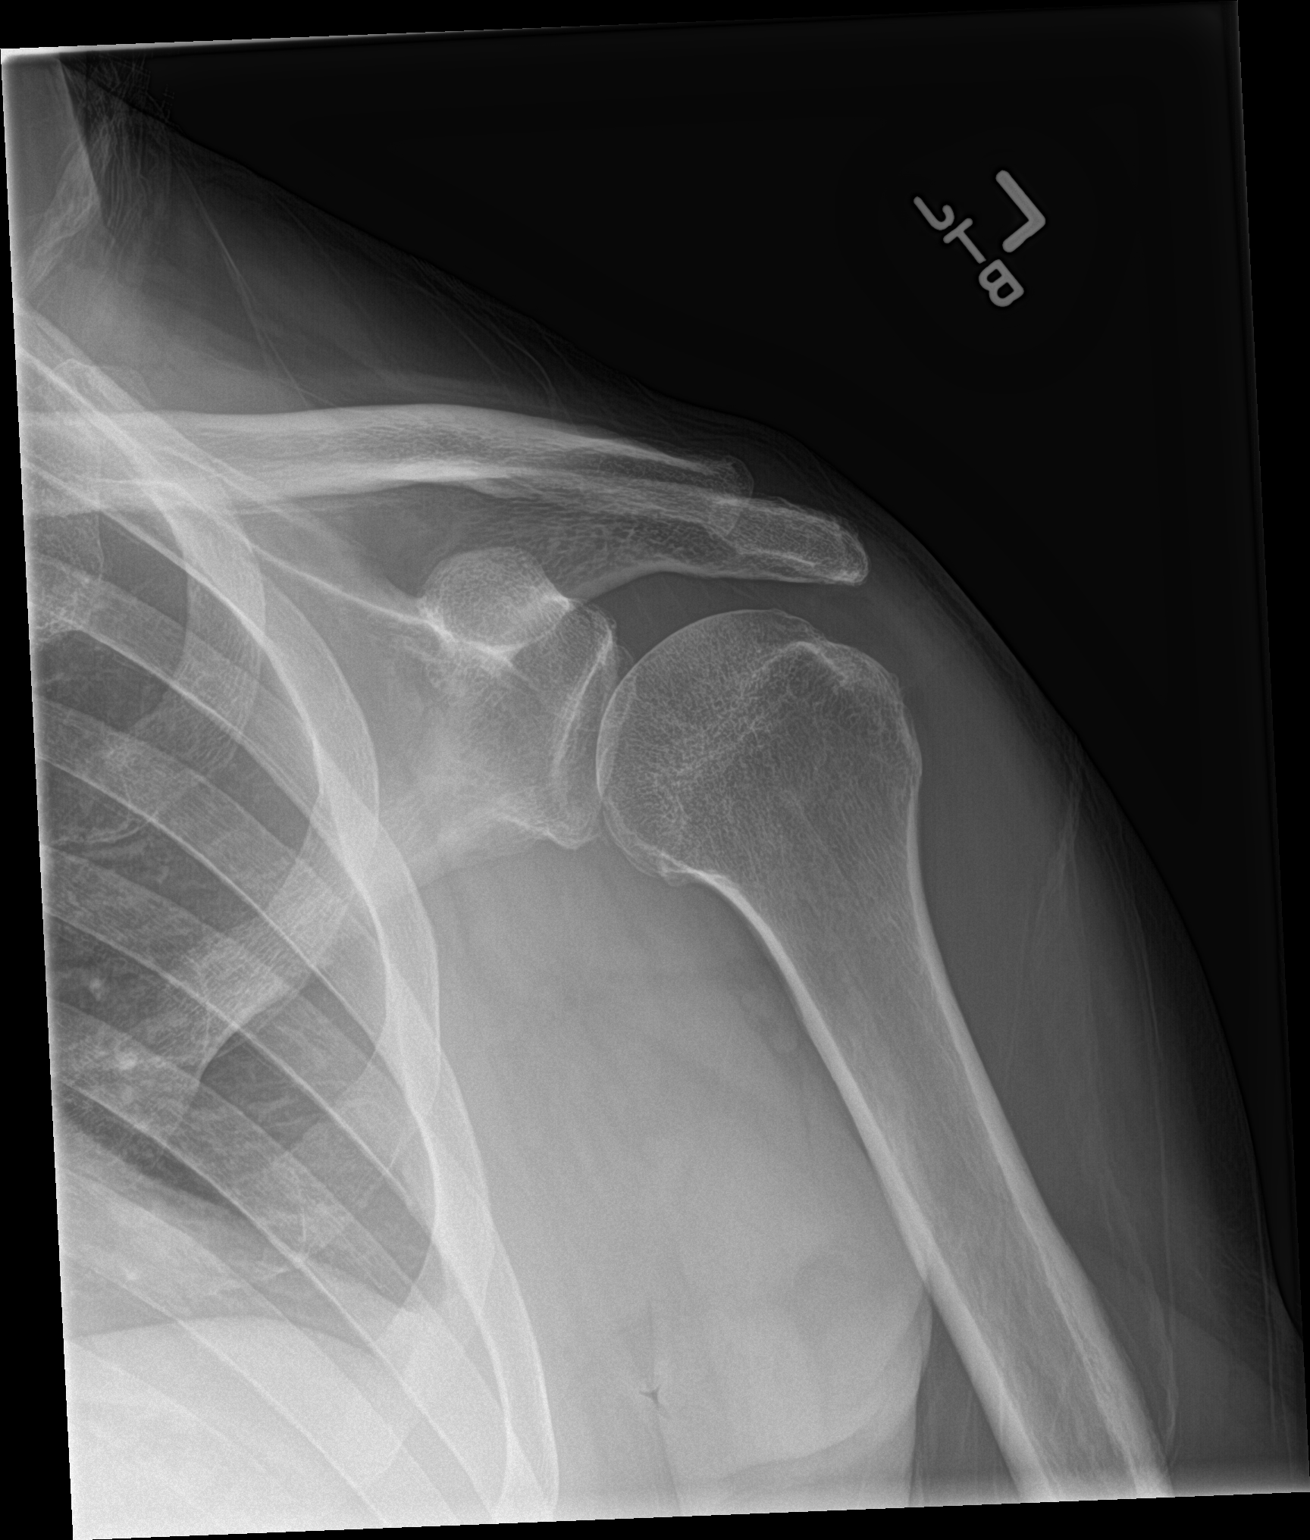

[shoulder y view]
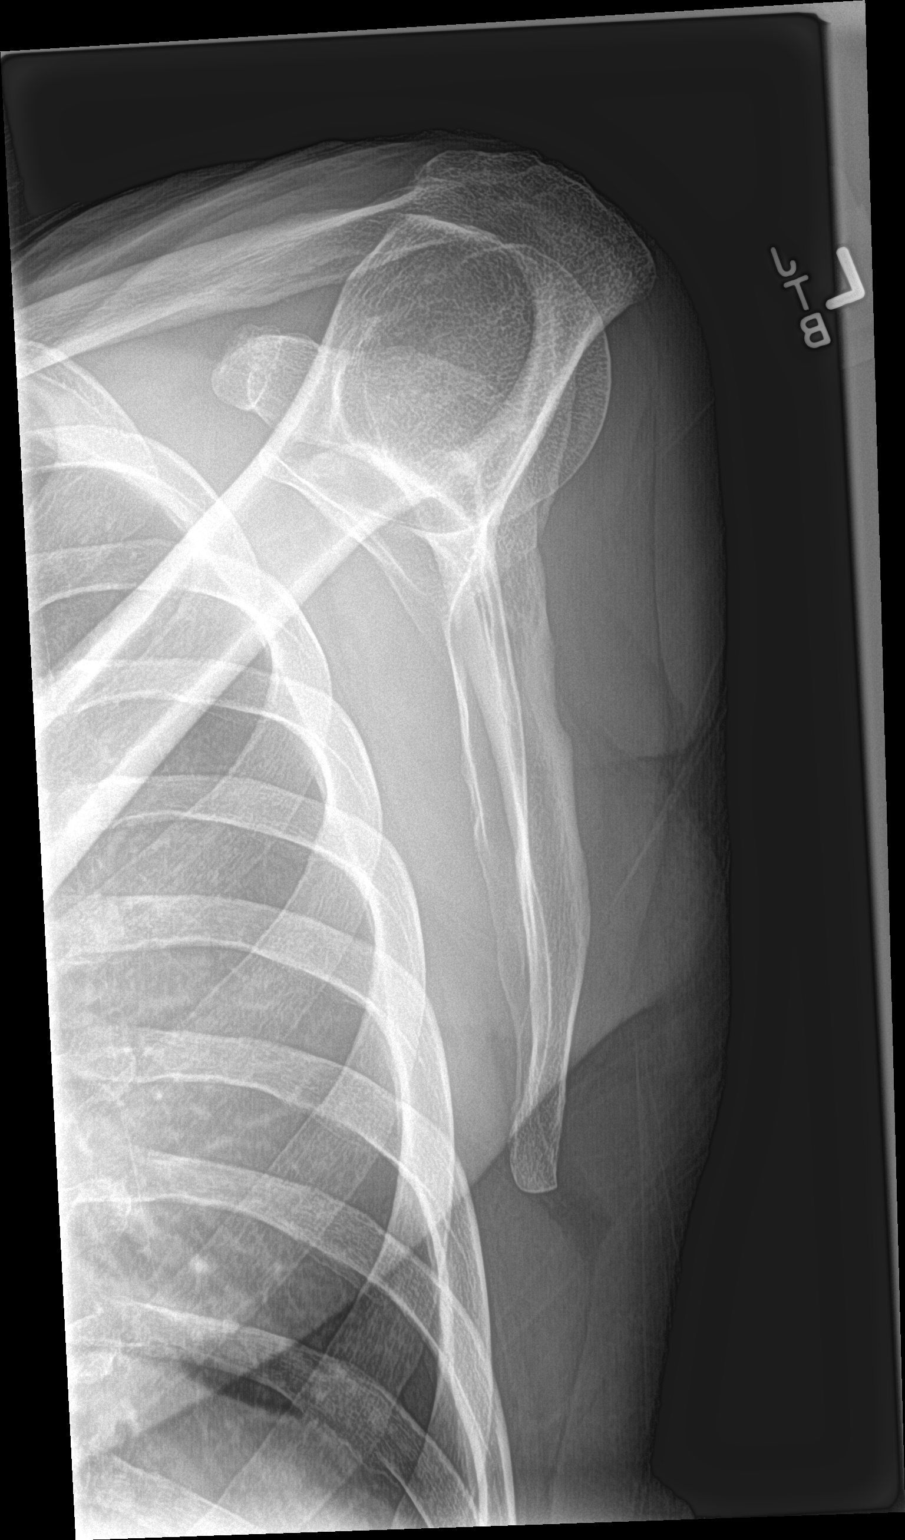

[shoulder axillary]
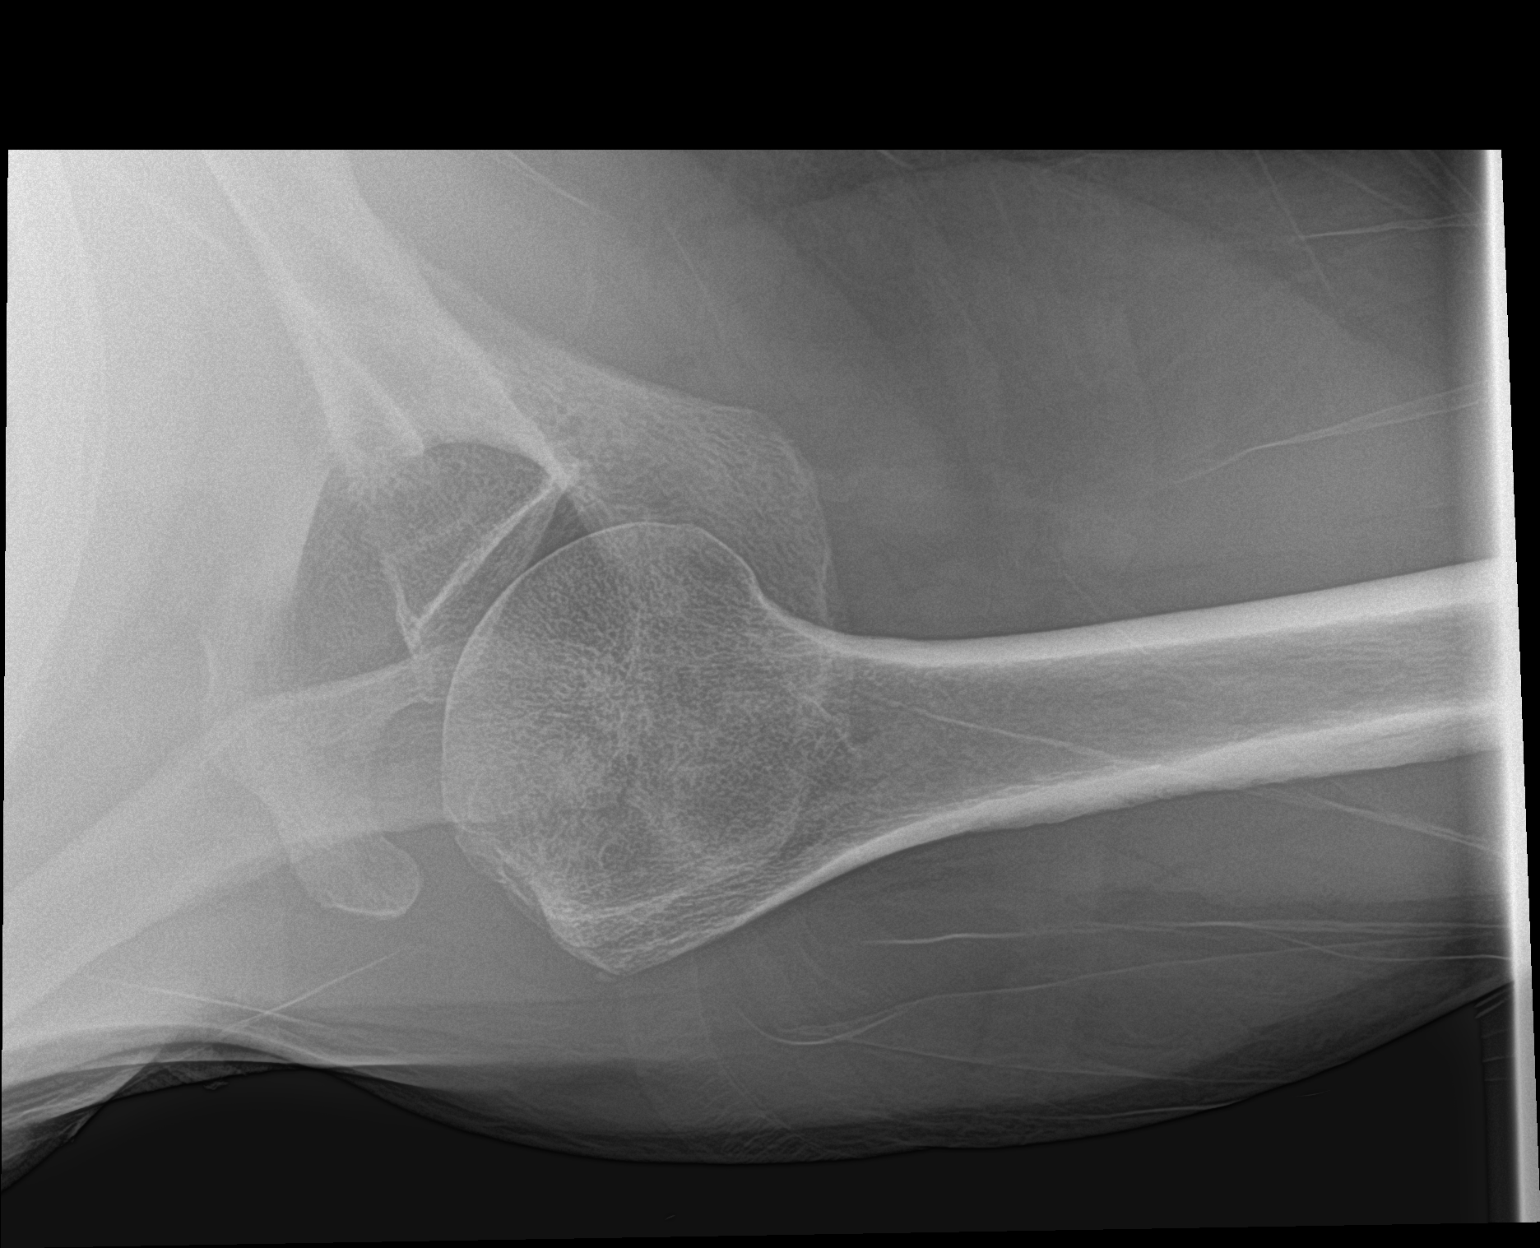

[3 of 3 positions shown; findings below may reference images not displayed]

FINDINGS: The left humeral head is in normal position and the glenohumeral
joint space is unremarkable. The left AC joint is normally aligned.
IMPRESSION: Negative.

## 2015-04-23 IMAGING — DX DG SHOULDER 2+V*R*
3 series · 3 of 3 positions shown · non-contrast
Comparison: None.

CLINICAL DATA: Pain and swelling of the joint

EXAM:
RIGHT SHOULDER - 2+ VIEW

[shoulder grashey]
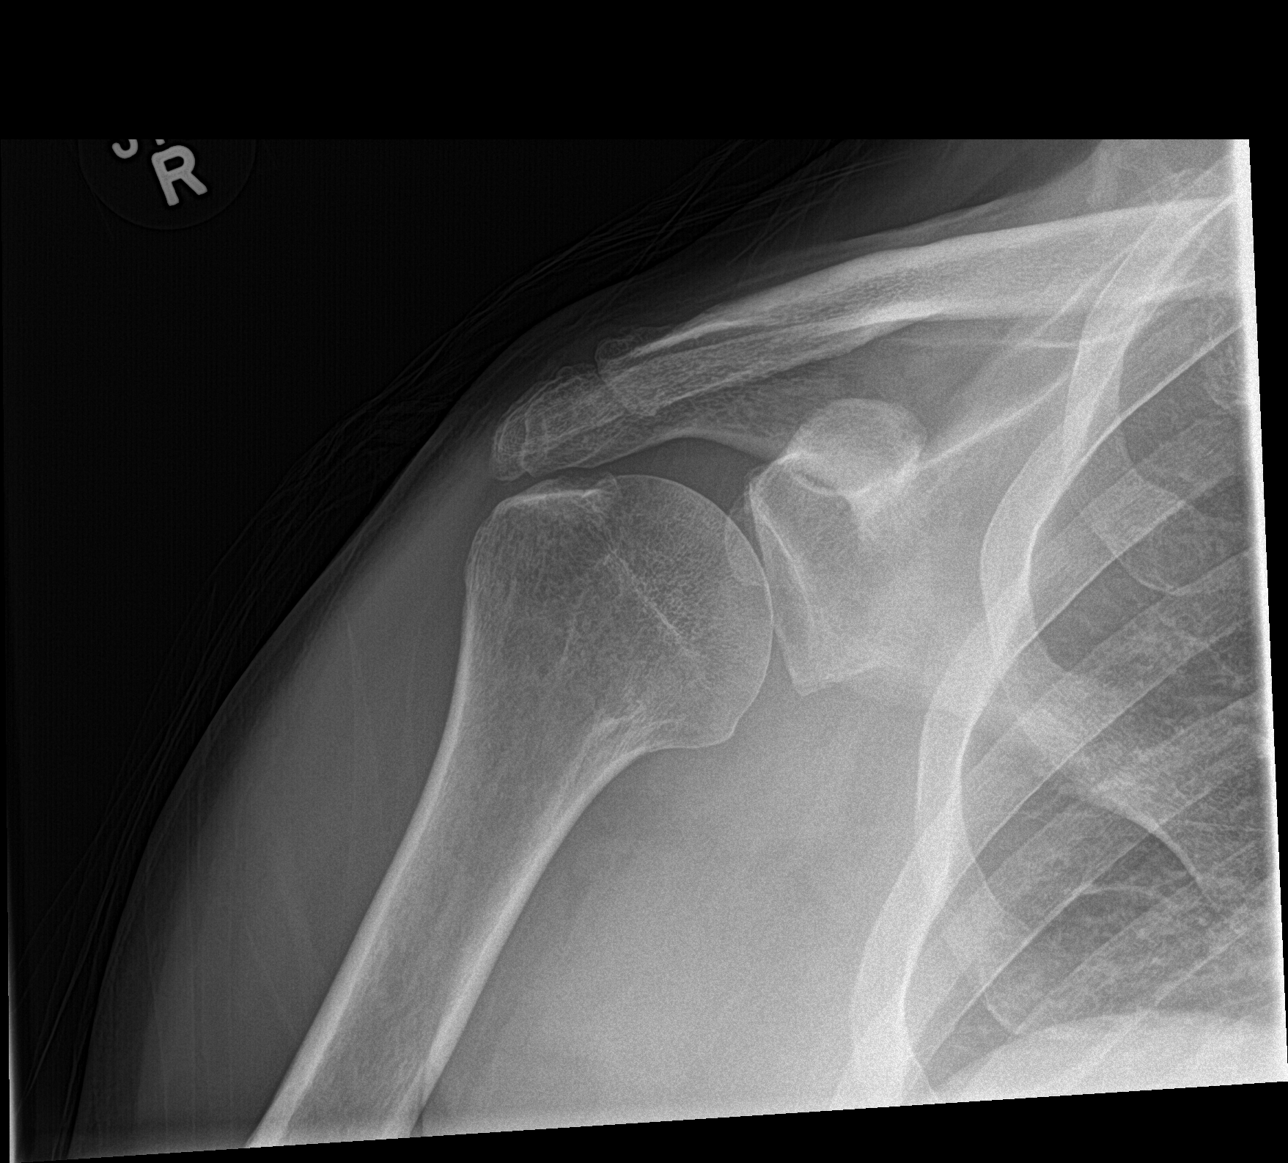

[shoulder y view]
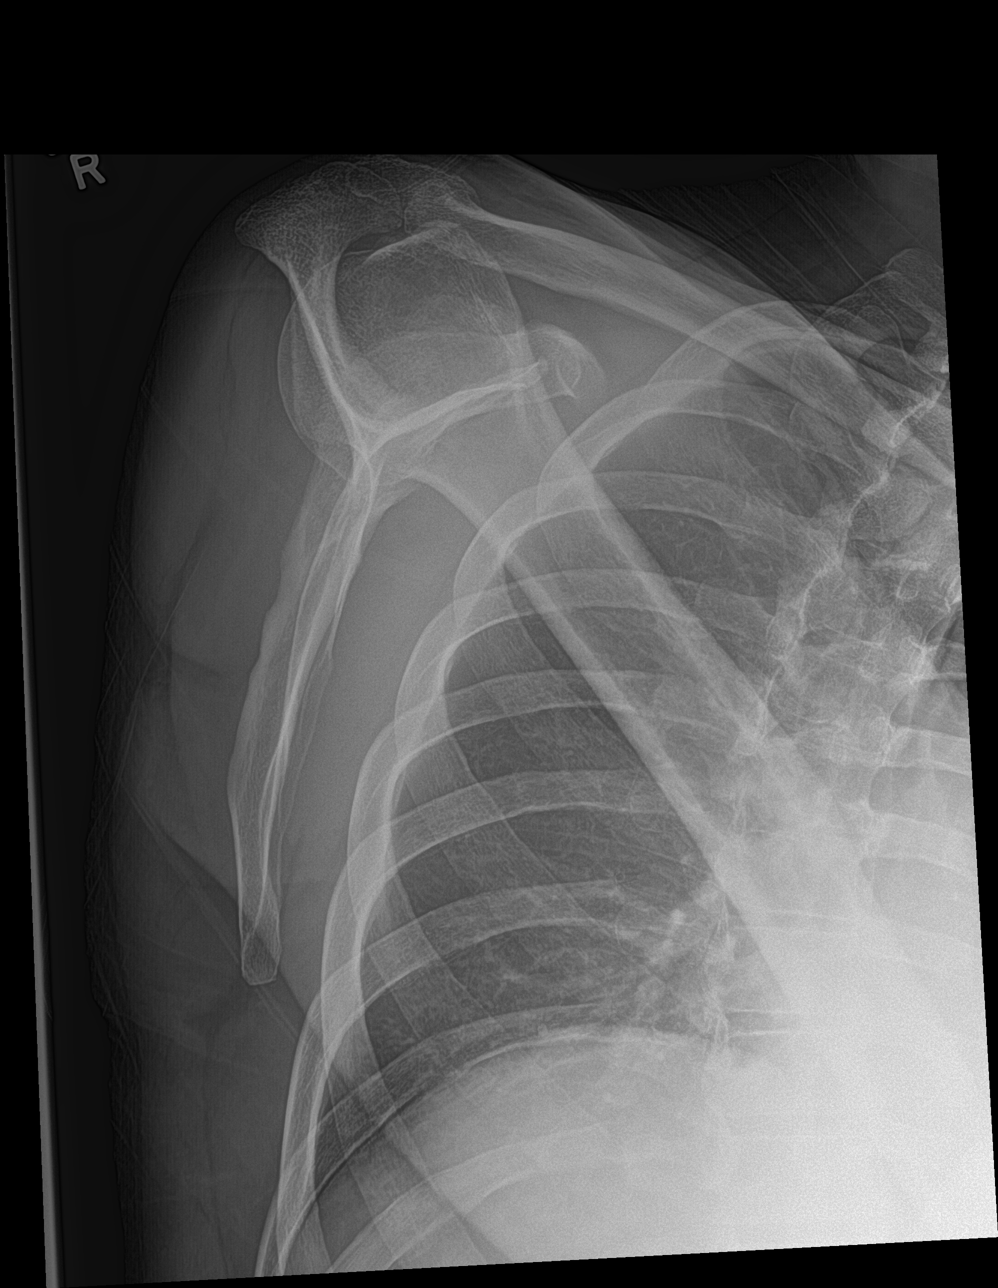

[shoulder axillary]
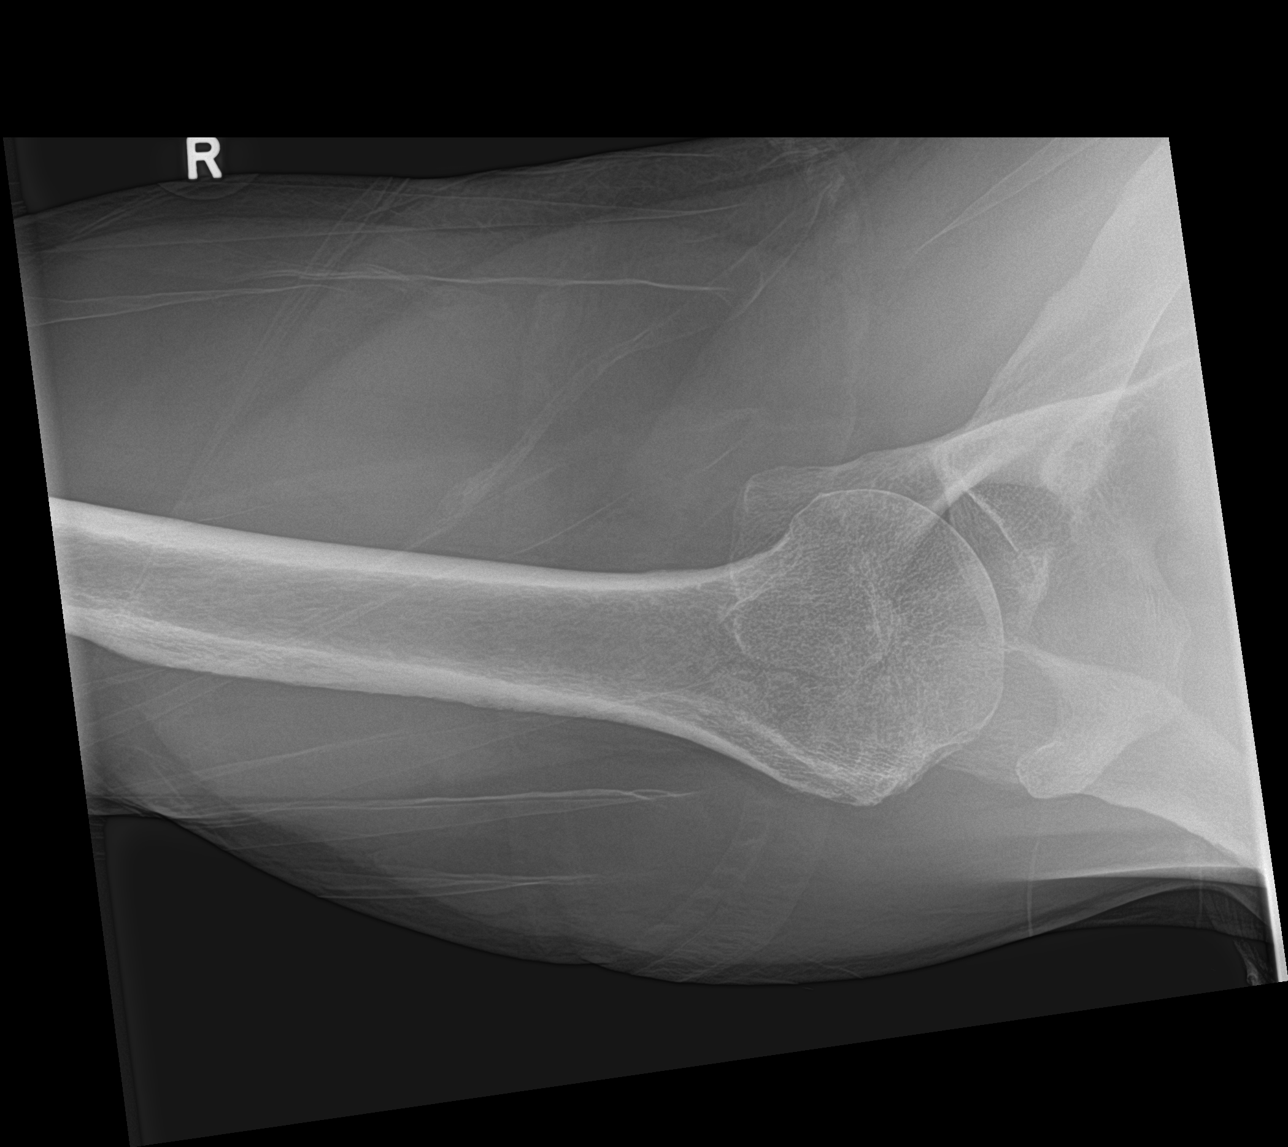

[3 of 3 positions shown; findings below may reference images not displayed]

FINDINGS: The right humeral head is in normal position and the glenohumeral
joint space appears normal. There is narrowing of the subacromial
joint space which predisposes to impingement and possibly chronic
rotator cuff disease. The right AC joint is normally aligned. No
acute abnormality is seen.
IMPRESSION: No acute abnormality. Possible chronic rotator cuff disease on the
right.

## 2015-04-23 IMAGING — DX DG HIP (WITH OR WITHOUT PELVIS) 5+V BILAT
5 series · 5 of 5 positions shown · non-contrast
Comparison: AP view of the pelvis of [DATE]

CLINICAL DATA: Pain swelling and inflammation of both hips for
proximally 2 months, patient undergoing evaluation for arthritis.

EXAM:
DG HIP (WITH OR WITHOUT PELVIS) 5+V BILAT

[pelvis ap]
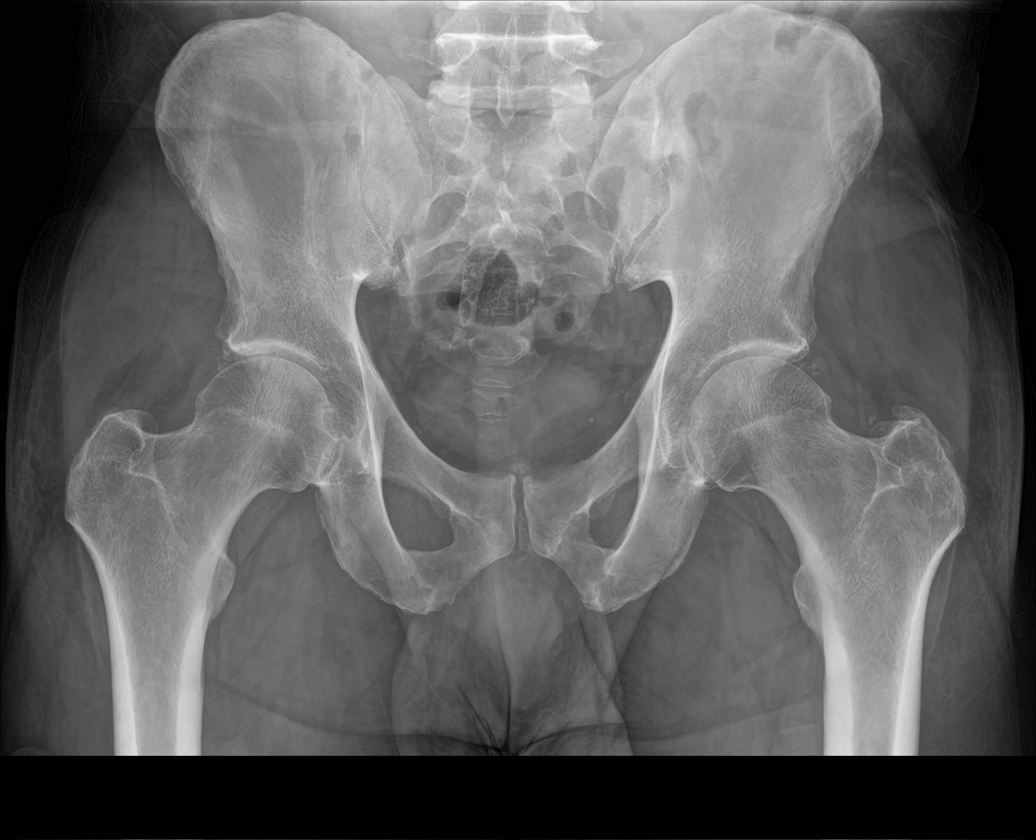

[hip ap (1 of 2)]
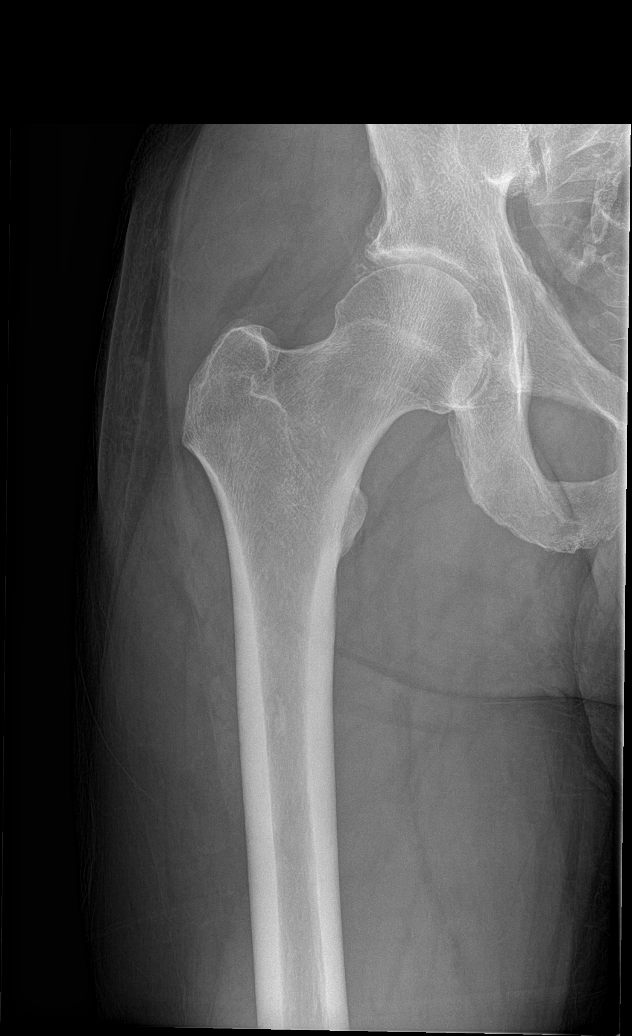

[hip lat (1 of 2)]
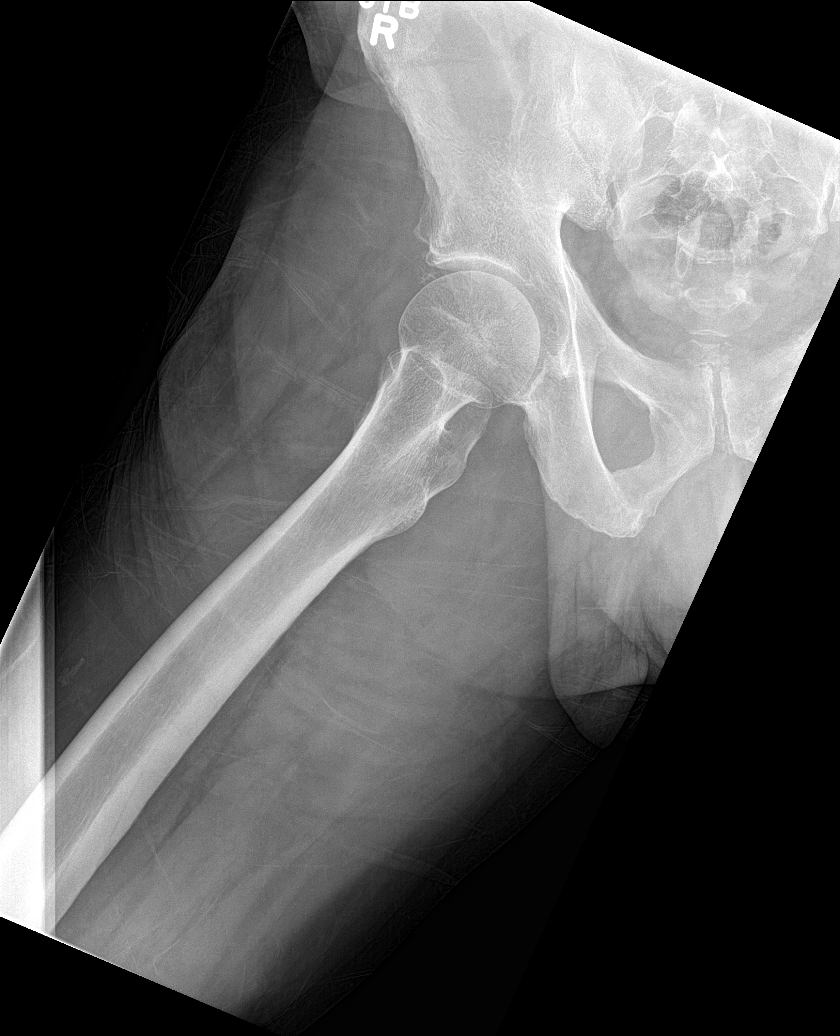

[hip ap (2 of 2)]
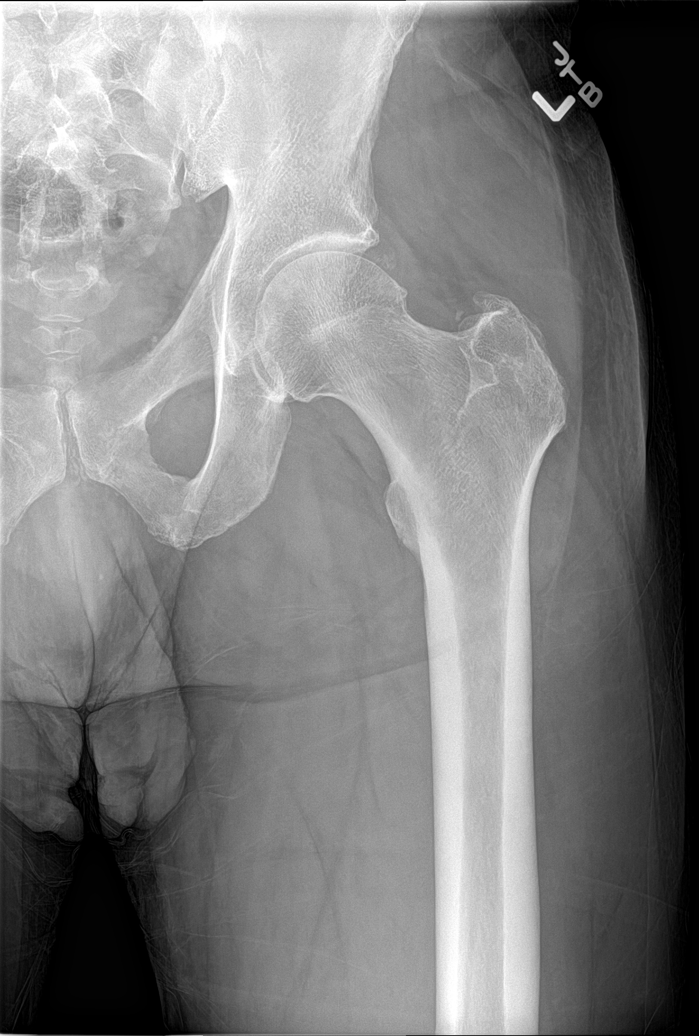

[hip lat (2 of 2)]
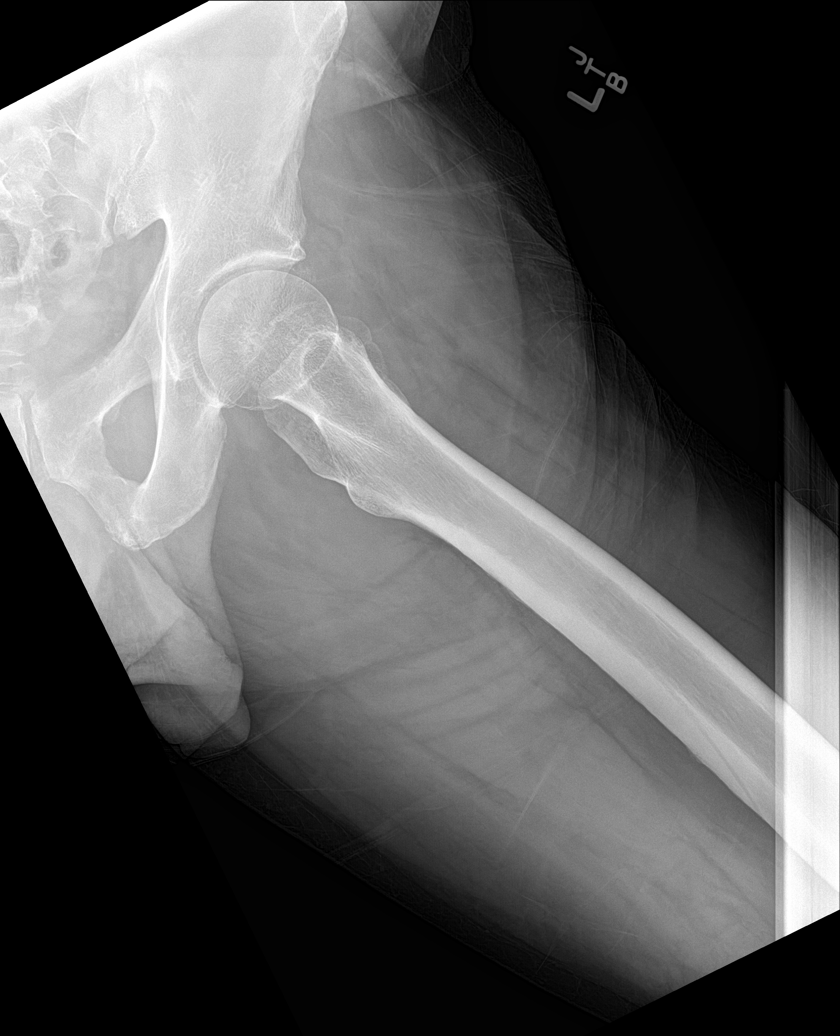

[5 of 5 positions shown; findings below may reference images not displayed]

FINDINGS: The bony pelvis is adequately mineralized. There is no lytic nor
blastic lesion. The pubic rami are intact. The sacrum and SI joints
are within the limits of normal.

AP and lateral views of both hips reveal mild fairly symmetric
narrowing of the right hip joint with mild asymmetric narrowing of
the left hip joint. The articular surfaces of the femoral heads and
acetabuli remains smoothly rounded. The femoral necks,
intertrochanteric, and subtrochanteric regions are normal. The
overlying soft tissues exhibit no abnormal findings.
IMPRESSION: There is mild narrowing of both hip joints slightly more asymmetric
on the left than on the right. There is no acute or significant
chronic bony abnormality of the pelvis or hips.

## 2015-04-23 IMAGING — DX DG HAND COMPLETE 3+V*R*
3 series · 3 of 3 positions shown · non-contrast
Comparison: None.

CLINICAL DATA: Joint swelling in hands

EXAM:
RIGHT HAND - COMPLETE 3+ VIEW

[hand pa]
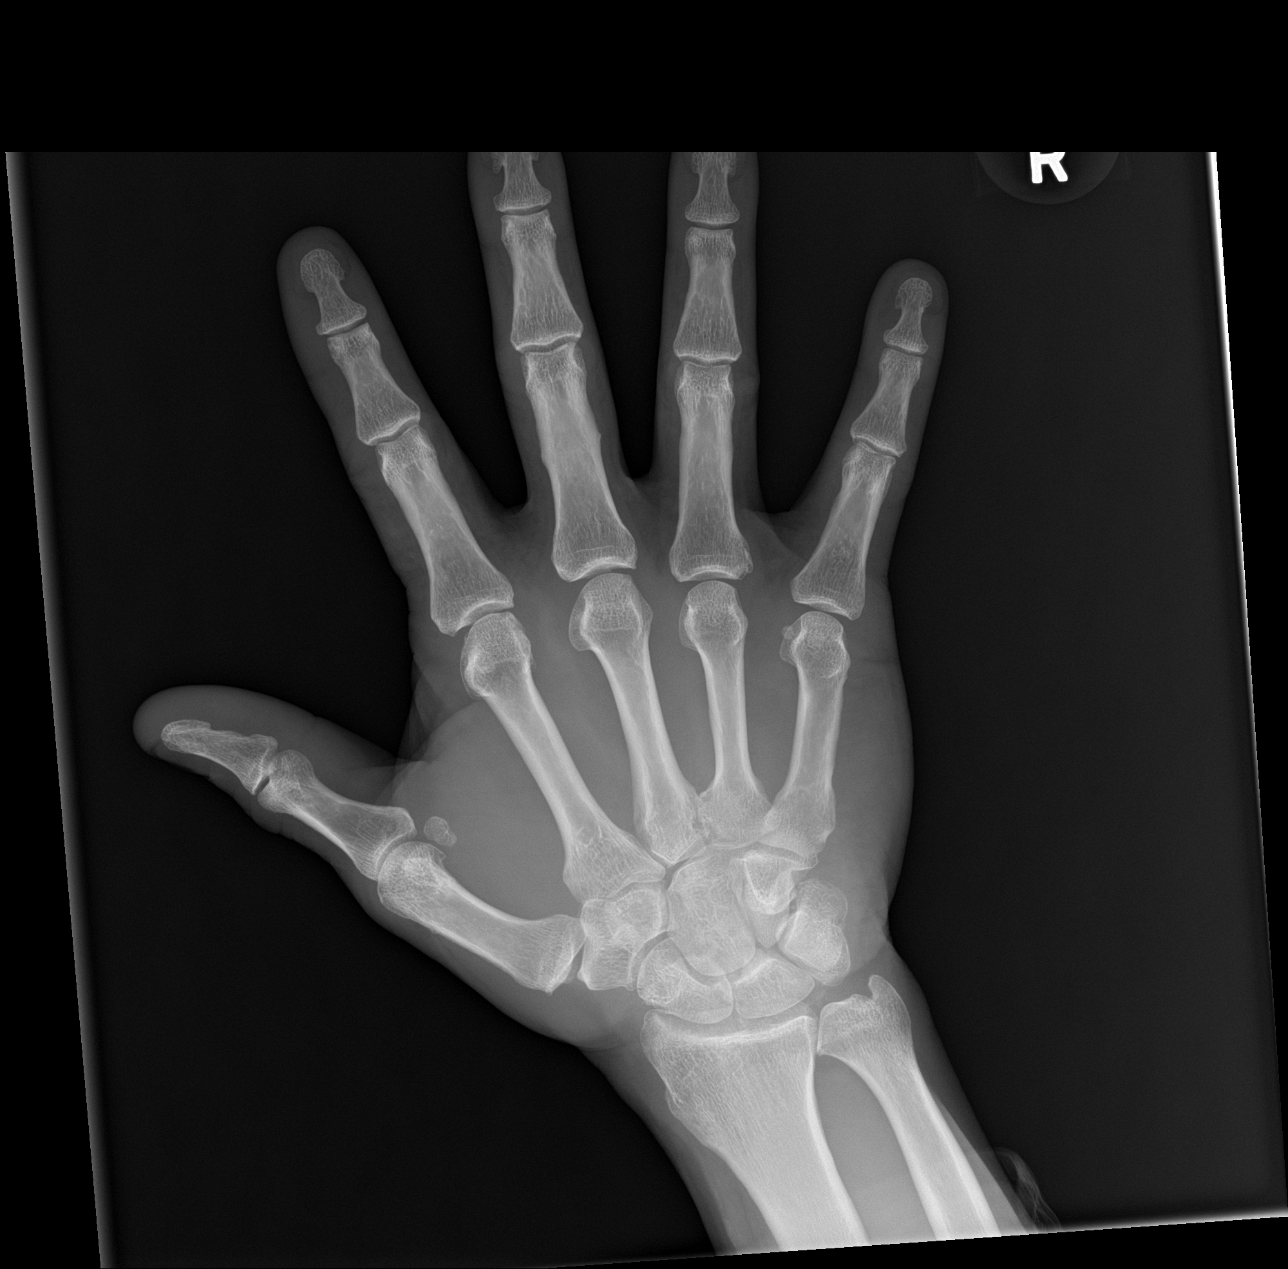

[hand obl]
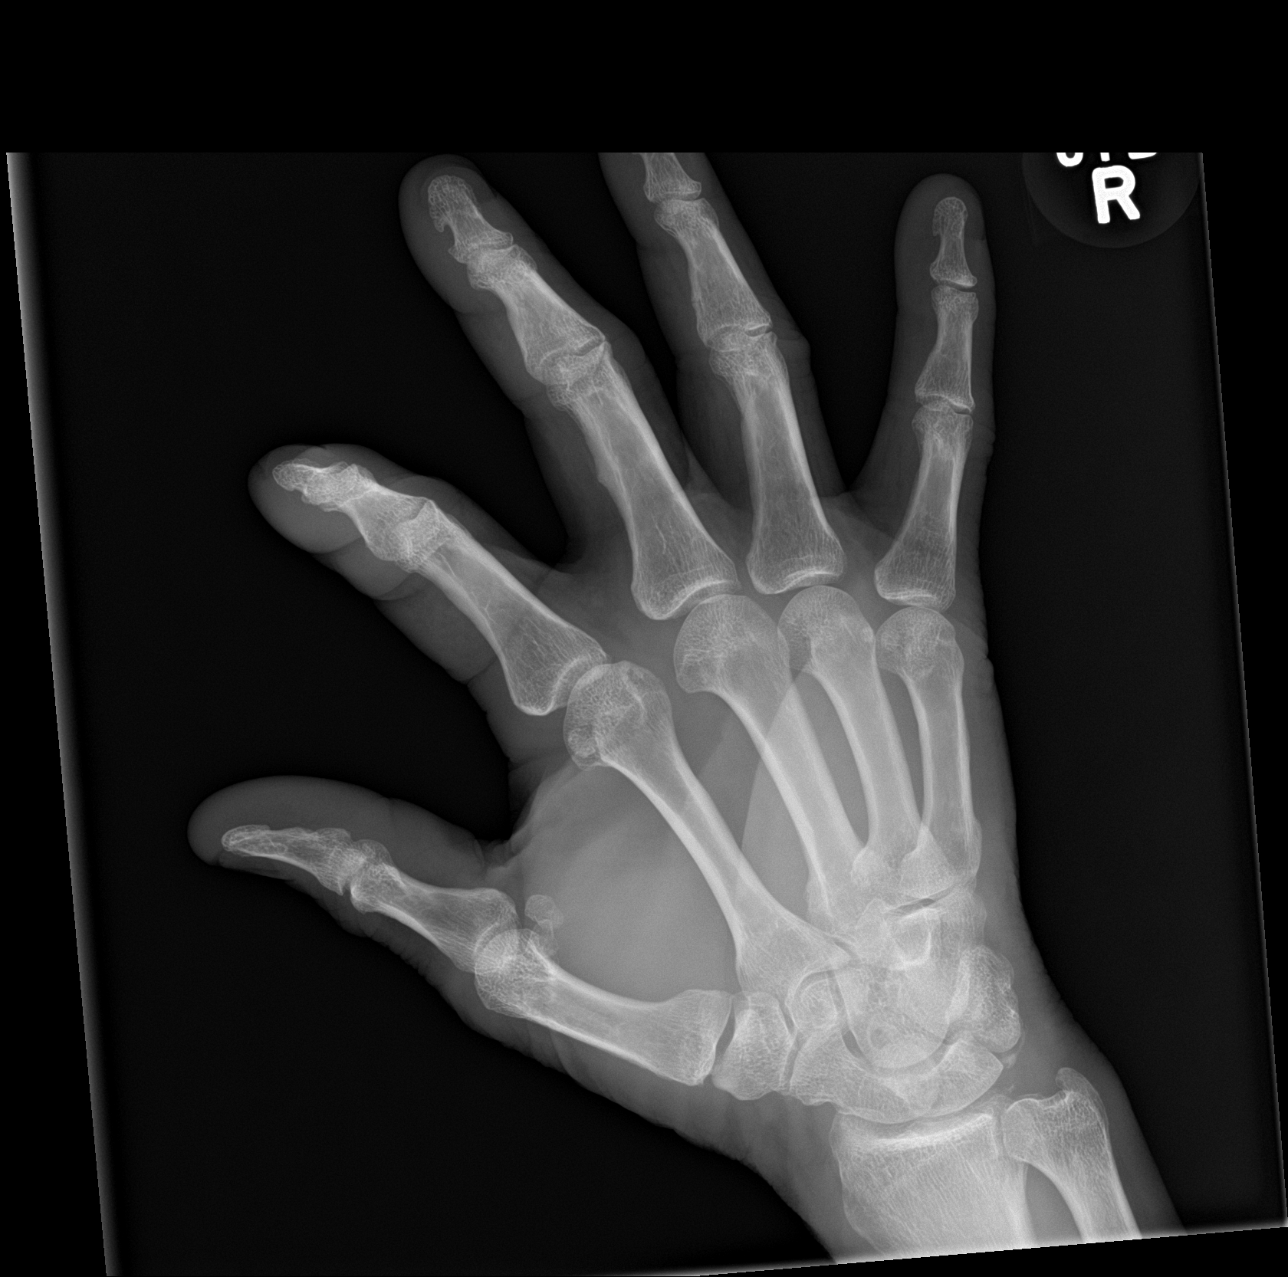

[hand lat]
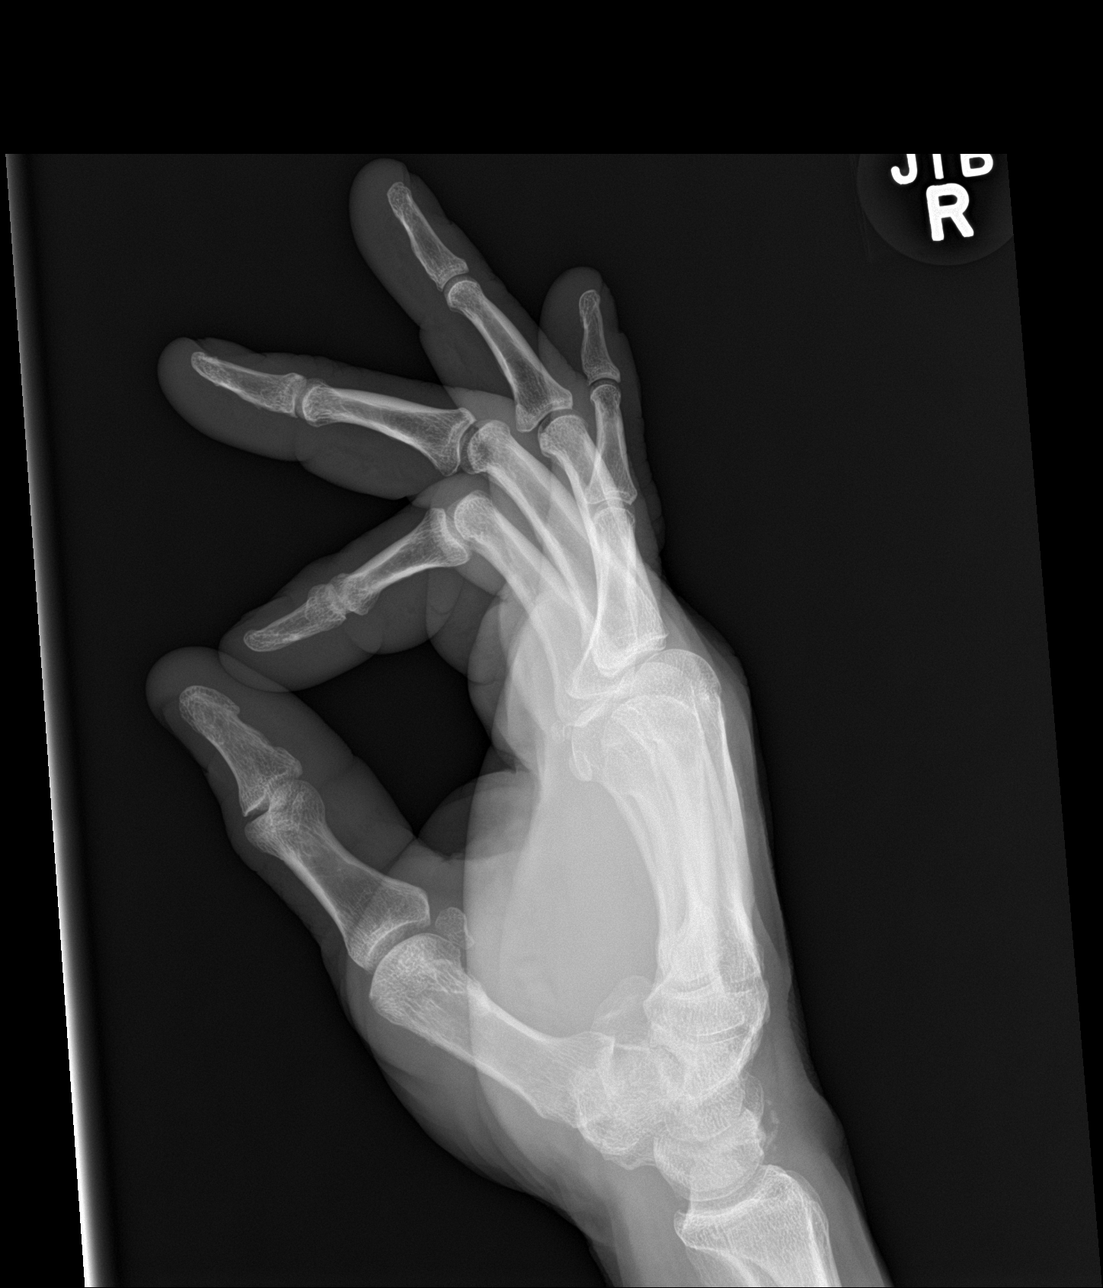

[3 of 3 positions shown; findings below may reference images not displayed]

FINDINGS: No acute fracture. No dislocation. Chondrocalcinosis adjacent to the
triquetrum.
IMPRESSION: No acute bony pathology.  Chondrocalcinosis is noted.

## 2015-04-23 IMAGING — DX DG HAND COMPLETE 3+V*L*
3 series · 3 of 3 positions shown · non-contrast
Comparison: None.

CLINICAL DATA: Pain and swelling of the hands

EXAM:
LEFT HAND - COMPLETE 3+ VIEW

[hand pa]
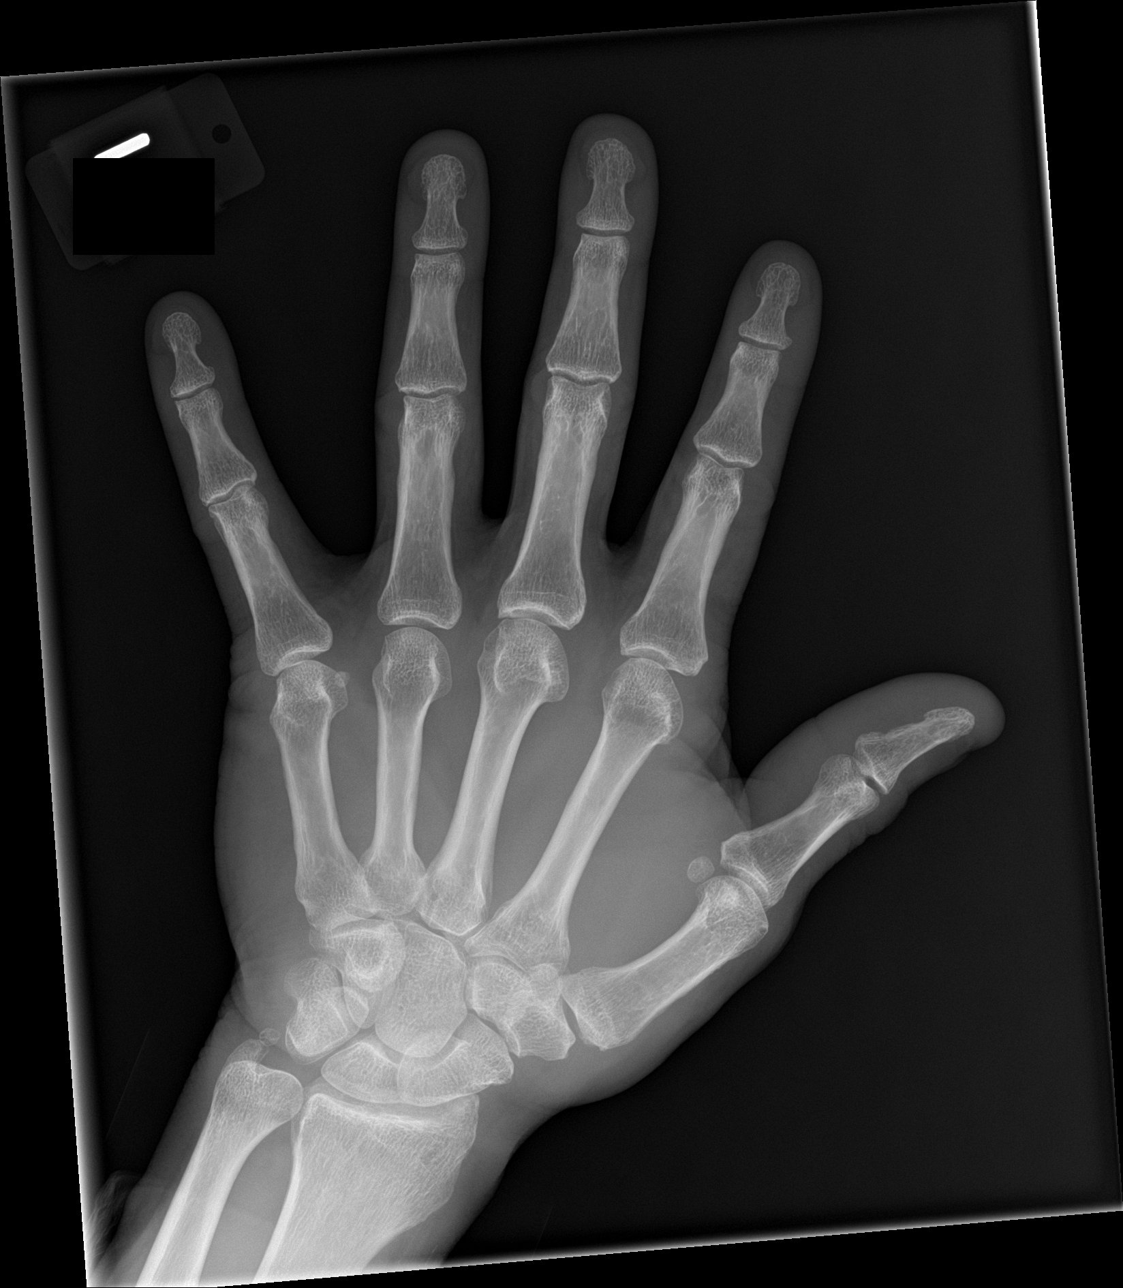

[hand obl]
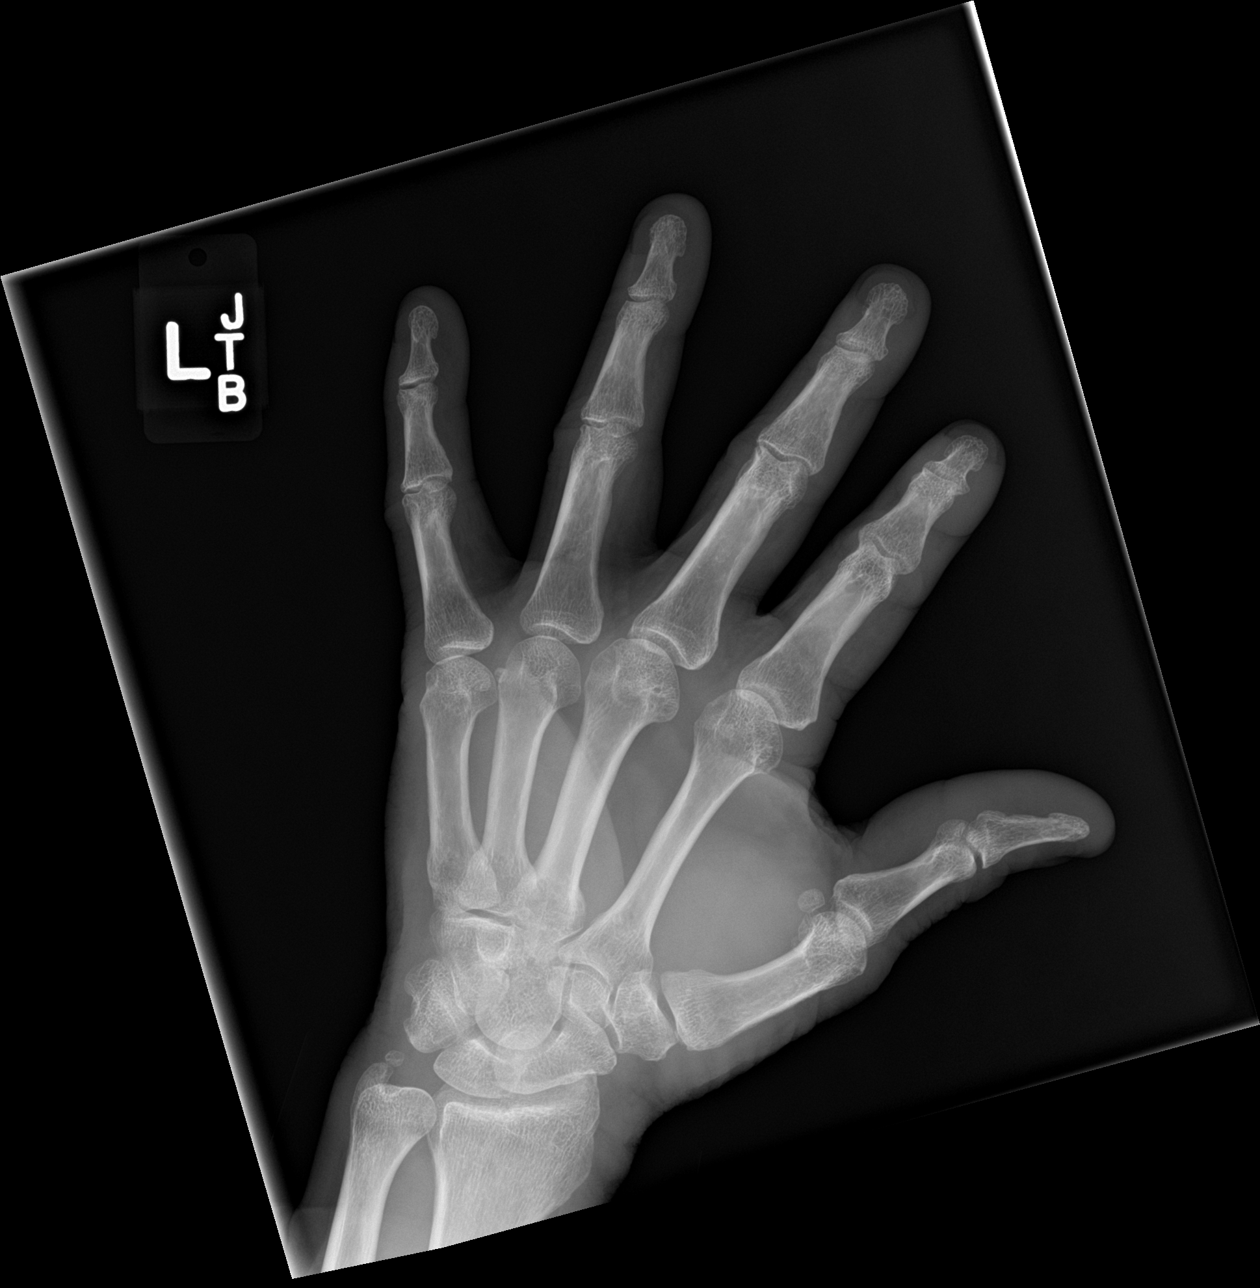

[hand lat]
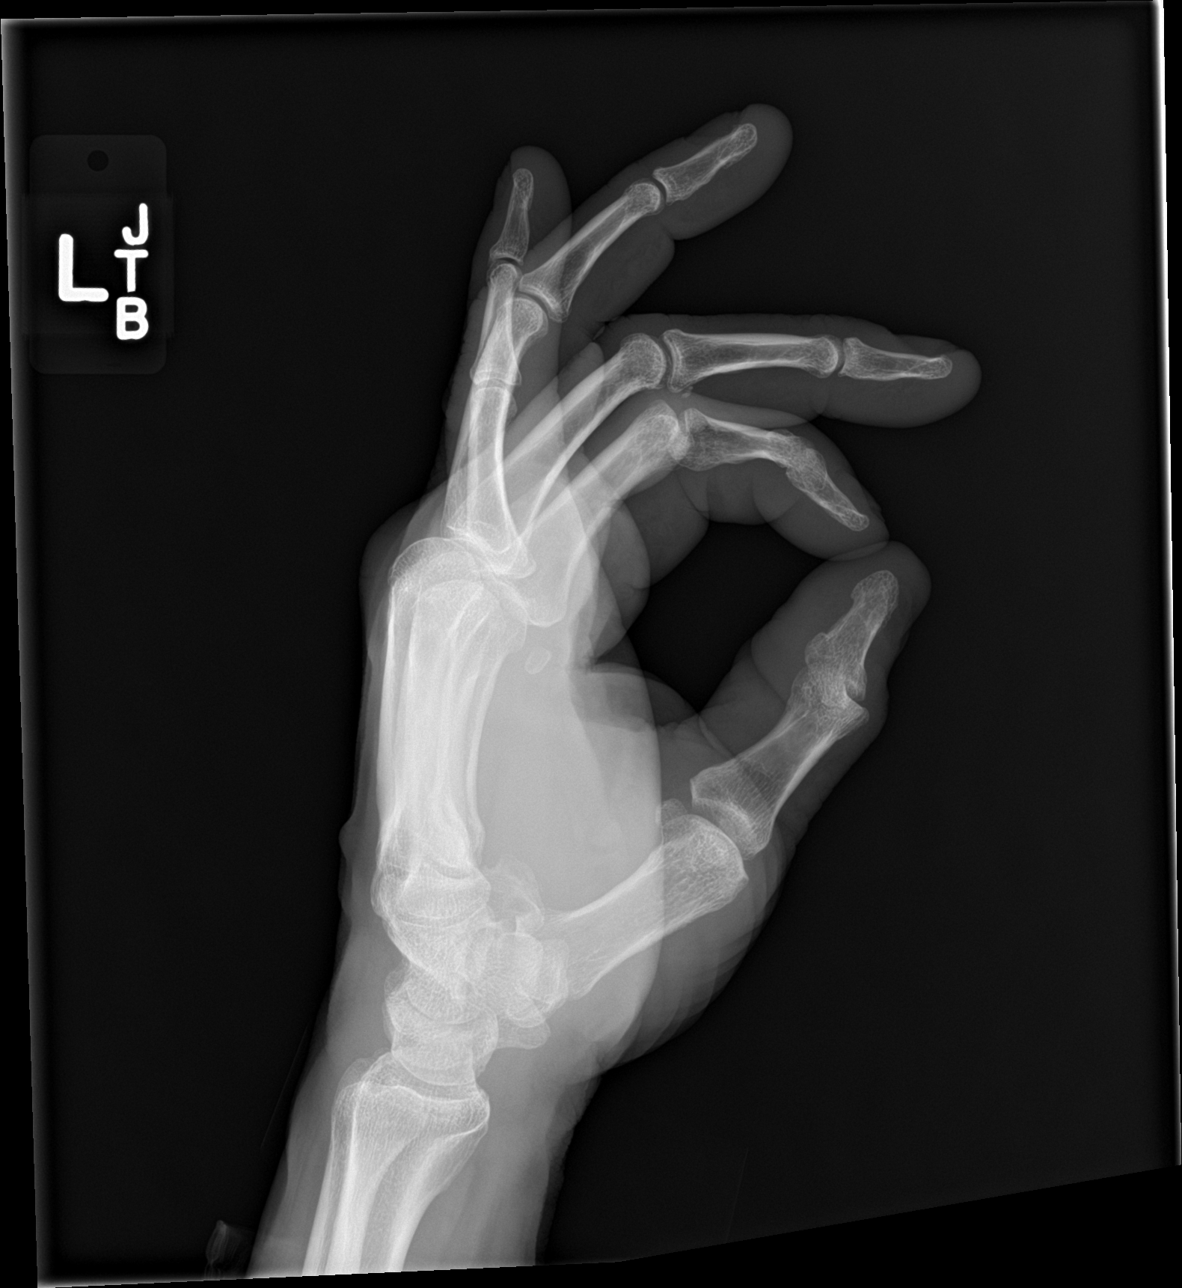

[3 of 3 positions shown; findings below may reference images not displayed]

FINDINGS: The radiocarpal joint space appears normal. The carpal bones are in
normal position. MCP, PIP, and DIP joints are unremarkable. No
erosion is seen. No significant degenerative change is noted.
IMPRESSION: Negative.

## 2015-04-23 MED ORDER — CALCIUM CARBONATE-VITAMIN D 500-200 MG-UNIT PO TABS: 2.0000 | ORAL_TABLET | Freq: Every day | ORAL | Status: DC

## 2015-04-23 MED ORDER — PREDNISONE 5 MG PO TABS: 10.0000 mg | ORAL_TABLET | Freq: Every day | ORAL | Status: DC

## 2015-04-23 MED ORDER — SILDENAFIL CITRATE 20 MG PO TABS: ORAL_TABLET | ORAL | Status: DC

## 2015-04-23 NOTE — Patient Instructions (Addendum)
-  Will check your bloodwork to find out why you are having joint pain and get xrays of your hands, shoulders, and hips -Keep taking 10 mg daily of prednisone, will likely reduce 1 mg every month unless you have flare -Take revatio for erectile dysfunction as prescribed  -Start taking oscal calcium-vitamin D two daily since you are on prednisone. Also keep taking the zantac. -Pleasure meeting you please come back in 1 month before your prednisone runs out   General Instructions:   Please bring your medicines with you each time you come to clinic.  Medicines may include prescription medications, over-the-counter medications, herbal remedies, eye drops, vitamins, or other pills.   Progress Toward Treatment Goals:  No flowsheet data found.  Self Care Goals & Plans:  No flowsheet data found.  No flowsheet data found.   Care Management & Community Referrals:  No flowsheet data found.
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
## 2015-04-24 ENCOUNTER — Encounter: Payer: Self-pay | Admitting: Internal Medicine

## 2015-04-24 DIAGNOSIS — D473 Essential (hemorrhagic) thrombocythemia: Secondary | ICD-10-CM | POA: Insufficient documentation

## 2015-04-24 DIAGNOSIS — D75839 Thrombocytosis, unspecified: Secondary | ICD-10-CM | POA: Insufficient documentation

## 2015-04-24 LAB — HIV ANTIBODY (ROUTINE TESTING W REFLEX): HIV Screen 4th Generation wRfx: NONREACTIVE

## 2015-04-25 LAB — CBC WITH DIFFERENTIAL/PLATELET
Basophils Absolute: 0 10*3/uL (ref 0.0–0.2)
Basos: 1 %
EOS (ABSOLUTE): 0 10*3/uL (ref 0.0–0.4)
Eos: 0 %
Hematocrit: 38.5 % (ref 37.5–51.0)
Hemoglobin: 12.8 g/dL (ref 12.6–17.7)
Immature Grans (Abs): 0 10*3/uL (ref 0.0–0.1)
Immature Granulocytes: 1 %
Lymphocytes Absolute: 1.5 10*3/uL (ref 0.7–3.1)
Lymphs: 19 %
MCH: 29.8 pg (ref 26.6–33.0)
MCHC: 33.2 g/dL (ref 31.5–35.7)
MCV: 90 fL (ref 79–97)
Monocytes Absolute: 0.5 10*3/uL (ref 0.1–0.9)
Monocytes: 6 %
Neutrophils Absolute: 6 10*3/uL (ref 1.4–7.0)
Neutrophils: 73 %
Platelets: 418 10*3/uL — ABNORMAL HIGH (ref 150–379)
RBC: 4.3 x10E6/uL (ref 4.14–5.80)
RDW: 14.5 % (ref 12.3–15.4)
WBC: 8.2 10*3/uL (ref 3.4–10.8)

## 2015-04-25 LAB — CMP14 + ANION GAP
ALT: 18 IU/L (ref 0–44)
AST: 14 IU/L (ref 0–40)
Albumin/Globulin Ratio: 1.6 (ref 1.1–2.5)
Albumin: 4.4 g/dL (ref 3.6–4.8)
Alkaline Phosphatase: 81 IU/L (ref 39–117)
Anion Gap: 19 mmol/L — ABNORMAL HIGH (ref 10.0–18.0)
BUN/Creatinine Ratio: 12 (ref 10–22)
BUN: 13 mg/dL (ref 8–27)
Bilirubin Total: <0.2 mg/dL (ref 0.0–1.2)
CO2: 23 mmol/L (ref 18–29)
Calcium: 9.6 mg/dL (ref 8.6–10.2)
Chloride: 98 mmol/L (ref 96–106)
Creatinine, Ser: 1.08 mg/dL (ref 0.76–1.27)
GFR calc Af Amer: 86 mL/min/{1.73_m2} (ref >59)
GFR calc non Af Amer: 74 mL/min/{1.73_m2} (ref >59)
Globulin, Total: 2.8 g/dL (ref 1.5–4.5)
Glucose: 98 mg/dL (ref 65–99)
Potassium: 4.6 mmol/L (ref 3.5–5.2)
Sodium: 140 mmol/L (ref 134–144)
Total Protein: 7.2 g/dL (ref 6.0–8.5)

## 2015-04-25 LAB — CK: Total CK: 119 U/L (ref 24–204)

## 2015-04-25 LAB — TSH: TSH: 0.647 u[IU]/mL (ref 0.450–4.500)

## 2015-04-25 LAB — URIC ACID: Uric Acid: 7.4 mg/dL (ref 3.7–8.6)

## 2015-04-25 LAB — RHEUMATOID FACTOR: Rhuematoid fact SerPl-aCnc: <10 IU/mL (ref 0.0–13.9)

## 2015-04-25 LAB — ANTI-DNA ANTIBODY, DOUBLE-STRANDED: dsDNA Ab: <1 IU/mL (ref 0–9)

## 2015-04-25 LAB — HEPATITIS C ANTIBODY: Hep C Virus Ab: <0.1 s/co ratio (ref 0.0–0.9)

## 2015-04-25 LAB — ANTINUCLEAR ANTIBODIES, IFA: ANA Titer 1: NEGATIVE

## 2015-04-25 LAB — CYCLIC CITRUL PEPTIDE ANTIBODY, IGG/IGA: Cyclic Citrullin Peptide Ab: 1 units (ref 0–19)

## 2015-04-25 LAB — SEDIMENTATION RATE: Sed Rate: 29 mm/hr (ref 0–30)

## 2015-04-25 LAB — ANTI-SMITH ANTIBODY: ENA SM Ab Ser-aCnc: <0.2 AI (ref 0.0–0.9)

## 2015-04-25 MED ORDER — IBUPROFEN 800 MG PO TABS: 800.0000 mg | ORAL_TABLET | Freq: Three times a day (TID) | ORAL | Status: DC | PRN

## 2015-04-25 NOTE — Assessment & Plan Note (Signed)
-  Pt declined tdap and flu vaccinations at this time, inquire again at next visit  -Defer screening colonoscopy until acquires orange card

## 2015-04-25 NOTE — Assessment & Plan Note (Addendum)
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   Plan:  -Obtain CMP, CBC w/diff, uric acid, ESR, ANA, RF, anti-CCP, anti-DS DNA, anti-smith, HIV Ab, Hep C Ab, and CK  -Obtain xrays of b/l hips, hands, and shoulders  -Continue prednisone 10 mg daily, instructed to increase back to 15 mg if starts to flare again with plan to lower 10 mg daily dose by 1 mg monthly or if on 15 mg dose by 2.5 mg every 2-4 weeks -Start calcium with vitamin D 500-200 mg 2 tabs daily and continue zantac 150 mg BID in setting of prednisone use -Continue ibuprofen 800 mg TID PRN pain -Pt to return in 1 month, unable to refer to rheumatology at this time until acquires orange card

## 2015-04-25 NOTE — Assessment & Plan Note (Signed)
Assessment: Pt with erectile dysfunction most likely due to organic cause in setting of untreated depression and overweight.    Plan:  -Prescribe trial of sildenafil 20-100 mg prior to sexual activty PRN, pt with no active chest pain and not on nitrate therapy  -Address depression at next visit  -Encourage weight loss due to overweight BMI 30.94

## 2015-04-29 NOTE — Progress Notes (Signed)
Internal Medicine Clinic Attending  Case discussed with Dr. Rabbani soon after the resident saw the patient.  We reviewed the resident's history and exam and pertinent patient test results.  I agree with the assessment, diagnosis, and plan of care documented in the resident's note.  

## 2015-05-09 ENCOUNTER — Ambulatory Visit: Payer: Self-pay

## 2015-05-14 ENCOUNTER — Telehealth: Payer: Self-pay | Admitting: Internal Medicine

## 2015-05-14 NOTE — Telephone Encounter (Signed)
Pt as question has some questions about Sildenafil. States that it is not working and wants to know what he should do.

## 2015-05-15 ENCOUNTER — Other Ambulatory Visit: Payer: Self-pay | Admitting: Internal Medicine

## 2015-05-15 ENCOUNTER — Ambulatory Visit: Payer: Self-pay

## 2015-05-15 ENCOUNTER — Ambulatory Visit: Payer: Self-pay | Admitting: Internal Medicine

## 2015-05-15 DIAGNOSIS — N529 Male erectile dysfunction, unspecified: Secondary | ICD-10-CM

## 2015-05-15 MED ORDER — SILDENAFIL CITRATE 20 MG PO TABS: ORAL_TABLET | ORAL | Status: DC

## 2015-05-15 NOTE — Telephone Encounter (Signed)
Spoke with patient, he has titrated to 3 tablets and is not getting mcu benefit.  I advised pt to try max dose of 5 pills and if this is not helpful he can discuss at appointment scheduled 3/21.  Pt has reservations related to taking multiple medications, but is willing to give a try.  Will need a refill sent in in order to have enough.  If the max dose does not help I have researched with pharmacy resident and per pill price on Cialis is around $10.  I told patient this and advised to discuss next week.  Can he have refill on sildenafil?

## 2015-05-15 NOTE — Telephone Encounter (Signed)
Yes will refill for 30 pills and then can reassess at next clinic visit. Thanks!  Dr. Naaman Plummer

## 2015-05-15 NOTE — Telephone Encounter (Signed)
I just phoned in the sildenafil to his pharmacy and they will call him when it is ready for pick-up later today.   Dr. Naaman Plummer

## 2015-05-20 ENCOUNTER — Telehealth: Payer: Self-pay | Admitting: Internal Medicine

## 2015-05-20 NOTE — Telephone Encounter (Signed)
APPT. REMINDER CALL, NO ANSWER, NO VOICE MAIL °

## 2015-05-21 ENCOUNTER — Encounter: Payer: Self-pay | Admitting: Internal Medicine

## 2015-05-21 ENCOUNTER — Ambulatory Visit (INDEPENDENT_AMBULATORY_CARE_PROVIDER_SITE_OTHER): Payer: Self-pay | Admitting: Internal Medicine

## 2015-05-21 VITALS — BP 147/76 | HR 92 | Temp 98.8°F | Ht 67.0 in | Wt 196.9 lb

## 2015-05-21 DIAGNOSIS — K029 Dental caries, unspecified: Secondary | ICD-10-CM

## 2015-05-21 DIAGNOSIS — M130 Polyarthritis, unspecified: Secondary | ICD-10-CM

## 2015-05-21 DIAGNOSIS — N529 Male erectile dysfunction, unspecified: Secondary | ICD-10-CM

## 2015-05-21 DIAGNOSIS — R739 Hyperglycemia, unspecified: Secondary | ICD-10-CM

## 2015-05-21 DIAGNOSIS — K0889 Other specified disorders of teeth and supporting structures: Secondary | ICD-10-CM

## 2015-05-21 DIAGNOSIS — E119 Type 2 diabetes mellitus without complications: Secondary | ICD-10-CM | POA: Insufficient documentation

## 2015-05-21 MED ORDER — PREDNISONE 1 MG PO TABS: 2.0000 mg | ORAL_TABLET | Freq: Every day | ORAL | Status: DC

## 2015-05-21 MED ORDER — SILDENAFIL CITRATE 20 MG PO TABS: ORAL_TABLET | ORAL | Status: DC

## 2015-05-21 NOTE — Patient Instructions (Signed)
[name],  It was a joy meeting you today.  For your joint pain, we are adding 2 mg of prednisone to your regimen for a total of 12 mg.  We are also screening you for diabetes.  We'll see you in 2 months.

## 2015-05-21 NOTE — Assessment & Plan Note (Signed)
A: Unclear if this is a vascular issue since he had no issues prior to initiating steroids. This is likely either psychologic in nature (first partner since the death of his wife) or perhaps some other etiology. Will query possible diabetes which may cause ED, he has been hyperglycemic in the past.  P:  Continue sildenafil 60 mg prn Check A1c

## 2015-05-21 NOTE — Assessment & Plan Note (Signed)
HPI and A: Patient endorses some tooth pain in his right lower molar. Poor dentition but now swelling on exam. He requests a dental referral.  P: Dental referral made

## 2015-05-21 NOTE — Assessment & Plan Note (Signed)
A: On reduced dose of steroid from 15 to 10 mg prednisone, patient is having re-emergent symptoms. The minimally therapeutic dose is likely between 10 and 15 mg.  P:  - Increase prednisone to 12 mg daily - Check ESR

## 2015-05-21 NOTE — Assessment & Plan Note (Signed)
A: Hyperglycemic in the past. No polyuria or polydipsia reported, however.  P: check A1c
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
## 2015-05-22 LAB — HEMOGLOBIN A1C
Est. average glucose Bld gHb Est-mCnc: 140 mg/dL
Hgb A1c MFr Bld: 6.5 % — ABNORMAL HIGH (ref 4.8–5.6)

## 2015-05-22 LAB — SEDIMENTATION RATE: Sed Rate: 9 mm/hr (ref 0–30)

## 2015-05-22 NOTE — Progress Notes (Signed)
Internal Medicine Clinic Attending  Case discussed with Dr. Ford at the time of the visit.  We reviewed the resident's history and exam and pertinent patient test results.  I agree with the assessment, diagnosis, and plan of care documented in the resident's note.  

## 2015-06-09 ENCOUNTER — Other Ambulatory Visit: Payer: Self-pay | Admitting: Internal Medicine

## 2015-06-10 ENCOUNTER — Other Ambulatory Visit: Payer: Self-pay | Admitting: *Deleted

## 2015-06-10 ENCOUNTER — Telehealth: Payer: Self-pay

## 2015-06-10 DIAGNOSIS — M130 Polyarthritis, unspecified: Secondary | ICD-10-CM

## 2015-06-10 MED ORDER — PREDNISONE 5 MG PO TABS: 10.0000 mg | ORAL_TABLET | Freq: Every day | ORAL | Status: DC

## 2015-06-10 NOTE — Telephone Encounter (Signed)
Request pending via surescripts, will call pt when complete

## 2015-06-10 NOTE — Telephone Encounter (Signed)
Pt requesting prednisone 5 mg to be filled.

## 2015-06-11 NOTE — Telephone Encounter (Signed)
LVM for patient to advise prednisone was sent to pharmacy

## 2015-07-05 ENCOUNTER — Other Ambulatory Visit: Payer: Self-pay

## 2015-07-05 DIAGNOSIS — M130 Polyarthritis, unspecified: Secondary | ICD-10-CM

## 2015-07-05 MED ORDER — PREDNISONE 1 MG PO TABS: 2.0000 mg | ORAL_TABLET | Freq: Every day | ORAL | Status: DC

## 2015-07-05 MED ORDER — PREDNISONE 5 MG PO TABS: 10.0000 mg | ORAL_TABLET | Freq: Every day | ORAL | Status: DC

## 2015-07-05 NOTE — Telephone Encounter (Signed)
Pt requesting prednisone to be filled @ [facility] on [address].

## 2015-07-05 NOTE — Telephone Encounter (Signed)
Dr Marijean Bravo increased pred to 12. Next appt June. Will fill until June appt

## 2015-07-24 ENCOUNTER — Other Ambulatory Visit: Payer: Self-pay | Admitting: Internal Medicine

## 2015-07-24 NOTE — Telephone Encounter (Signed)
Prednisone was refilled earlier this month. Per Dr. Sheral Apley note on 05/21/15, he is currently advised to take Prednisone 12 mg daily for which prescriptions (5 mg #60 and 1 mg #60) were renewed on 07/05/2015 with 1 refill each.  Refill for 15 mg daily Prednisone is not appropriate at this time.

## 2015-08-07 ENCOUNTER — Encounter: Payer: Self-pay | Admitting: Internal Medicine

## 2015-09-10 ENCOUNTER — Other Ambulatory Visit: Payer: Self-pay | Admitting: Internal Medicine

## 2015-09-10 DIAGNOSIS — M130 Polyarthritis, unspecified: Secondary | ICD-10-CM

## 2015-09-10 MED ORDER — PREDNISONE 1 MG PO TABS: 2.0000 mg | ORAL_TABLET | Freq: Every day | ORAL | Status: DC

## 2015-09-10 MED ORDER — PREDNISONE 5 MG PO TABS: 10.0000 mg | ORAL_TABLET | Freq: Every day | ORAL | Status: DC

## 2015-09-10 NOTE — Telephone Encounter (Signed)
No-showed last appointment, but has an appointment next month.

## 2015-09-10 NOTE — Telephone Encounter (Signed)
predniSONE (DELTASONE) 5 MG tablet [facility] [address] sildenafil (REVATIO) 20 MG tablet [facility] on [address] inside of [address]

## 2015-09-11 ENCOUNTER — Other Ambulatory Visit: Payer: Self-pay | Admitting: *Deleted

## 2015-09-11 DIAGNOSIS — N529 Male erectile dysfunction, unspecified: Secondary | ICD-10-CM

## 2015-09-11 MED ORDER — SILDENAFIL CITRATE 20 MG PO TABS: ORAL_TABLET | ORAL | Status: DC

## 2015-09-12 NOTE — Telephone Encounter (Signed)
Called in to pharmacy, did not see paper RX to fax

## 2015-10-09 ENCOUNTER — Encounter: Payer: Self-pay | Admitting: Internal Medicine

## 2015-11-26 ENCOUNTER — Telehealth: Payer: Self-pay | Admitting: Internal Medicine

## 2015-11-26 NOTE — Telephone Encounter (Signed)
REMINDER CALL TIME TO RENEW GCCN CARD, LMTCB

## 2015-11-29 ENCOUNTER — Ambulatory Visit: Payer: Self-pay

## 2015-12-05 ENCOUNTER — Ambulatory Visit: Payer: Self-pay

## 2016-01-12 ENCOUNTER — Other Ambulatory Visit: Payer: Self-pay | Admitting: Internal Medicine

## 2016-01-12 DIAGNOSIS — N529 Male erectile dysfunction, unspecified: Secondary | ICD-10-CM

## 2016-01-14 ENCOUNTER — Other Ambulatory Visit: Payer: Self-pay | Admitting: Internal Medicine

## 2016-01-14 DIAGNOSIS — N529 Male erectile dysfunction, unspecified: Secondary | ICD-10-CM

## 2016-05-11 ENCOUNTER — Other Ambulatory Visit: Payer: Self-pay | Admitting: Internal Medicine

## 2016-05-11 DIAGNOSIS — M130 Polyarthritis, unspecified: Secondary | ICD-10-CM

## 2016-05-21 ENCOUNTER — Other Ambulatory Visit: Payer: Self-pay | Admitting: Internal Medicine

## 2016-05-21 DIAGNOSIS — M130 Polyarthritis, unspecified: Secondary | ICD-10-CM

## 2016-05-22 MED ORDER — PREDNISONE 1 MG PO TABS: 2.0000 mg | ORAL_TABLET | Freq: Every day | ORAL | 0 refills | Status: DC

## 2016-05-22 NOTE — Telephone Encounter (Signed)
Per Dr. Sheral Apley note on 05/21/15, he is currently advised to take Prednisone 12 mg daily (Two 5 mg tablets and two 1 mg tablets daily). Patient has an appointment on 06/01/16. Will refill Prednisone for a total of 12 mg daily two weeks supply to cover until next appointment.

## 2016-05-22 NOTE — Telephone Encounter (Signed)
Called pt to informed him of refills and to keep appt - but no answer; unable to leave message.

## 2016-05-26 ENCOUNTER — Ambulatory Visit: Payer: Self-pay

## 2016-06-01 ENCOUNTER — Encounter: Payer: Self-pay | Admitting: Internal Medicine

## 2016-06-01 ENCOUNTER — Encounter (INDEPENDENT_AMBULATORY_CARE_PROVIDER_SITE_OTHER): Payer: Self-pay

## 2016-06-01 ENCOUNTER — Ambulatory Visit (INDEPENDENT_AMBULATORY_CARE_PROVIDER_SITE_OTHER): Payer: Self-pay | Admitting: Internal Medicine

## 2016-06-01 ENCOUNTER — Ambulatory Visit: Payer: Self-pay

## 2016-06-01 VITALS — BP 152/81 | HR 81 | Temp 98.8°F | Ht 67.0 in | Wt 211.9 lb

## 2016-06-01 DIAGNOSIS — M130 Polyarthritis, unspecified: Secondary | ICD-10-CM

## 2016-06-01 DIAGNOSIS — E0969 Drug or chemical induced diabetes mellitus with other specified complication: Secondary | ICD-10-CM

## 2016-06-01 DIAGNOSIS — Z9114 Patient's other noncompliance with medication regimen: Secondary | ICD-10-CM

## 2016-06-01 DIAGNOSIS — I159 Secondary hypertension, unspecified: Secondary | ICD-10-CM

## 2016-06-01 DIAGNOSIS — E119 Type 2 diabetes mellitus without complications: Secondary | ICD-10-CM

## 2016-06-01 DIAGNOSIS — N529 Male erectile dysfunction, unspecified: Secondary | ICD-10-CM

## 2016-06-01 DIAGNOSIS — R030 Elevated blood-pressure reading, without diagnosis of hypertension: Secondary | ICD-10-CM | POA: Insufficient documentation

## 2016-06-01 MED ORDER — SILDENAFIL CITRATE 20 MG PO TABS: 20.0000 mg | ORAL_TABLET | ORAL | 2 refills | Status: DC | PRN

## 2016-06-01 MED ORDER — HYDROCHLOROTHIAZIDE 25 MG PO TABS: 25.0000 mg | ORAL_TABLET | Freq: Every day | ORAL | 1 refills | Status: DC

## 2016-06-01 MED ORDER — LISINOPRIL 20 MG PO TABS: 20.0000 mg | ORAL_TABLET | Freq: Every day | ORAL | 11 refills | Status: DC

## 2016-06-01 MED ORDER — PREDNISONE 5 MG PO TABS: 15.0000 mg | ORAL_TABLET | Freq: Every day | ORAL | 0 refills | Status: DC

## 2016-06-01 NOTE — Progress Notes (Signed)
   CC: PMR follow up  HPI:  [name] is a 61 y.o. male with a past medical history listed below here today for follow up of his PMR.   For details of today's visit and the status of his chronic medical issues please refer to the assessment and plan.   Past Medical History:  Diagnosis Date  . Erectile dysfunction   . Polyarthritis     Review of Systems:   See HPI  Physical Exam:  Vitals:   06/01/16 1552  BP: (!) 155/82  Pulse: 95  Temp: 98.8 F (37.1 C)  TempSrc: Oral  SpO2: 98%  Weight: 211 lb 14.4 oz (96.1 kg)  Height: 5\' 7"  (1.702 m)   Physical Exam  Constitutional: He is oriented to person, place, and time and well-developed, well-nourished, and in no distress.  HENT:  Head: Normocephalic and atraumatic.  Cardiovascular: Normal rate, regular rhythm and normal heart sounds.   Pulmonary/Chest: Effort normal and breath sounds normal.  Neurological: He is alert and oriented to person, place, and time. He has normal sensation, normal strength and normal reflexes.  Skin: Skin is warm and dry.    Assessment & Plan:   See Encounters Tab for problem based charting.  Patient discussed with Dr. Dareen Piano

## 2016-06-01 NOTE — Patient Instructions (Addendum)
[name],  It was a pleasure meeting you today. It is very important that we follow with you regularly to manage your medications and symptoms. I am going to restart you on the Prednisone 15 mg daily today. I would like you to take it every day for the next 2 weeks. After 2 weeks please cut the dose down to 12.5 mg daily. After another 2 weeks drop the dose to 10 mg daily. If your symptoms get worse after dropping the dose please let us know.  Your blood pressure has also been very elevated. I am going to start you on a new medication today called Lisinopril 20 mg daily. Please take this every day. If you have a blood pressure cuff at home please check your blood pressure periodically and keep a log.   Please follow up with [name] and get your [name] so we can get lab work and follow with you.  I would like to see you back in 2 weeks for follow up.    Polymyalgia Rheumatica Polymyalgia rheumatica (PMR) is an inflammatory disorder that causes aching and stiffness in your muscles and joints. Sometimes, PMR leads to a more dangerous condition (temporal arteritis or giant cell arteritis), which can cause vision loss. What are the causes? The exact cause of PMR is not known. What increases the risk? This condition is more likely to develop in:  Females.  People who are 12 years of age or older.  Caucasians. What are the signs or symptoms?   Pain and stiffness are the main symptoms of PMR. Symptoms may start slowly or suddenly. The symptoms:  May be worse after inactivity and in the morning.  May affect your:  Hips, buttocks, and thighs.  Neck, arms, and shoulders. This can make it hard to raise your arms above your head.  Hands and wrists. Other symptoms include:  Fever.  Tiredness.  Weakness.  Decreased appetite. This may lead to weight loss. How is this diagnosed? This condition is diagnosed with a medical history and physical exam. You may need to see a health  care provider who specializes in diseases of the joint, muscles, and bones (rheumatologist). You may also have tests, including:  Blood tests.  X-rays. How is this treated? PMR usually goes away without treatment, but it may take years for that to happen. In the meantime, your health care provider may recommend low-dose steroids to help manage your symptoms of pain and stiffness. Regular exercise and rest will also help your symptoms. Follow these instructions at home:  Take over-the-counter and prescription medicines only as told by your health care provider.  Make sure to get enough rest and sleep.  Eat a healthy and nutritious diet.  Try to exercise most days of the week. Ask your health care provider what type of exercise is best for you.  Keep all follow-up visits as told by your health are provider. This is important. Contact a health care provider if:  Your symptoms are not controlled with medicine.  You have side effects from steroids. These may include:  Weight gain.  Swelling.  Insomnia.  Mood changes.  Bruising.  High blood sugar readings, if you have diabetes.  Higher than normal blood pressure readings, if you monitor your blood pressure. Get help right away if:  You develop symptoms of temporal arteritis, such as:  A change in vision.  Severe headache.  Scalp pain.  Jaw pain. This information is not intended to replace advice given to  you by your health care provider. Make sure you discuss any questions you have with your health care provider. Document Released: 03/26/2004 Document Revised: 07/25/2015 Document Reviewed: 08/29/2014 Elsevier Interactive Patient Education  2017 [employer].

## 2016-06-01 NOTE — Assessment & Plan Note (Addendum)
[name] was initially seen 03/2015 with complaints of a month history or worsening joint pain and weakness involving his hands, arms, shoulders, hips and knees. Noticed some swelling of his hands but no erythema, numbness or tingling. Also noted reduced grip strength at that time.   XR of his hands, shoulders, knee, and hips were obtained. All were negative with the exception of evidence of some chondrocalcinosis in his right hand.  ESR was obtained and noted to be elevated at 48. No CRP was checked. He was started on Prednisone 15 mg daily at that time for presumed PMR. He had improvement in symptoms. He was tapered down to 10 mg daily and symptoms occurred. He was then increased back to 12 mg daily in 05/2015 and was subsequently lost to follow up. The rest of his lab work up including uric acid, ANA, RF, anti-CCP, anti-DS DNA, anti-smith, HIV, Hep C and CK were all unremarkable.   Reports that he read a lot of the side effects online and has been taking it as needed. Says he takes the prednisone when he has the pain until the pain goes away and then he will stop it. Reports he does a self taper. Starts with 15 mg daily and tapers himself by 5 mg every few days until he stops. Denies any headaches, vision changes or jaw pain.   Assessment: Polyarthritis likely 2/2 PMR  Plan:  Had lengthy discussion with [name] over his medications and taking them as prescribed as well and following up in clinic.  Will give him a prescription for prednisone 15 mg daily x 2 weeks. Taper by 2.5 mg every 2 weeks to 10 mg daily. Then taper 1 mg monthly. Taper as symptoms allow.  RTC in 2 weeks. Patient with no insurance or orange card. Check labs at next visit with orange card.

## 2016-06-01 NOTE — Assessment & Plan Note (Signed)
Requesting refill on his sildenafil for erectile dysfunction. Could be secondary to steroids since no symptoms prior but A1c was 6.5 at last visit 05/2015. No lipid panel on file.   Assessment: ED etiology unclear  Plan: Refill sildenafil today, check labs at next visit once has insurance coverage.

## 2016-06-01 NOTE — Assessment & Plan Note (Signed)
Lab Results  Component Value Date   HGBA1C 6.5 (H) 05/21/2015    A1c noted to be 6.5 on 05/2015 visit. Denies any polyuria or polydipsia today. He had been on prednisone for 3 months at that time and could be secondary to his steroids. Does not have insurance currently - defer labs to follow up visit.   Assessment: DM   Plan: Check A1c, microalbuminuria and BMET at next visit.

## 2016-06-04 NOTE — Progress Notes (Signed)
Internal Medicine Clinic Attending  Case discussed with Dr. Boswell at the time of the visit.  We reviewed the resident's history and exam and pertinent patient test results.  I agree with the assessment, diagnosis, and plan of care documented in the resident's note.  

## 2016-06-15 ENCOUNTER — Ambulatory Visit: Payer: Self-pay

## 2016-06-16 ENCOUNTER — Encounter: Payer: Self-pay | Admitting: Internal Medicine

## 2016-07-08 ENCOUNTER — Encounter (INDEPENDENT_AMBULATORY_CARE_PROVIDER_SITE_OTHER): Payer: Self-pay

## 2016-07-08 ENCOUNTER — Ambulatory Visit (INDEPENDENT_AMBULATORY_CARE_PROVIDER_SITE_OTHER): Payer: Self-pay | Admitting: Internal Medicine

## 2016-07-08 ENCOUNTER — Encounter: Payer: Self-pay | Admitting: Internal Medicine

## 2016-07-08 VITALS — BP 130/75 | HR 75 | Temp 98.5°F | Wt 207.0 lb

## 2016-07-08 DIAGNOSIS — M130 Polyarthritis, unspecified: Secondary | ICD-10-CM

## 2016-07-08 DIAGNOSIS — R030 Elevated blood-pressure reading, without diagnosis of hypertension: Secondary | ICD-10-CM

## 2016-07-08 DIAGNOSIS — Z7952 Long term (current) use of systemic steroids: Secondary | ICD-10-CM

## 2016-07-08 DIAGNOSIS — R7303 Prediabetes: Secondary | ICD-10-CM

## 2016-07-08 LAB — GLUCOSE, CAPILLARY: Glucose-Capillary: 90 mg/dL (ref 65–99)

## 2016-07-08 LAB — POCT GLYCOSYLATED HEMOGLOBIN (HGB A1C): Hemoglobin A1C: 6

## 2016-07-08 MED ORDER — PREDNISONE 5 MG PO TABS: 10.0000 mg | ORAL_TABLET | Freq: Every day | ORAL | 0 refills | Status: DC

## 2016-07-08 NOTE — Assessment & Plan Note (Signed)
Repeat Hgb A1c is 6.0 today improved from 6.5 last year. He is on chronic steroids and should have continued monitoring for diabetes while on prednisone. - Continue to monitor

## 2016-07-08 NOTE — Assessment & Plan Note (Signed)
Patient with chronic steroid use for polyarthritis suspected secondary to polymyalgia rheumatica. He has not been taking Prednisone as prescribed, instead self-dosing for flare ups with quick tapers.   He is appropriately concerned about side effects from steroid use. I am concerned that the flare up symptoms he feels may be related to quickly tapering off steroids after chronic use. I had a long discussion with him regarding the side effects of prednisone use as well as side effects of quick tapers. I have asked him to continue his Prednisone 10 mg daily for the next 4 weeks, after which we will begin a very slow taper by 1 mg per month going forward. Ideally, if he is able to adhere to a slow taper regimen without reoccurrence of symptoms, we may be able to get him of steroids altogether or at least to the lowest dose possible. He will need to follow up in 1 month for future refills. He expresses understanding and says he will try this. - Continue Prednisone 10 mg once daily for 1 month - Begin slow taper by 1 mg per month after next visit (will need prescription for 1 mg tablets of prednisone if he follows up) - Advised to take Calcium-Vitamin D supplements for bone health - Advised to follow up with our financial counselor for orange card - Patient provided information about Prednisone in AVS

## 2016-07-08 NOTE — Progress Notes (Signed)
CC: Polyarthritis  HPI:  [name] is a 61 y.o. male with PMH of Polyarthritis, pre-diabetes, and elevated blood pressure who presents for follow up management of the same. Please see problem based charting for status of patient's chronic medical issues.  Polyarthritis: Patient initially seen in [facility] in January 2017 with symptoms of polyarthritis. ESR was mildly elevated at 48. He was suspected to have Polymyalgia and started on Prednisone 15 mg daily. On follow up in February 2017 and reported improvement after 2 weeks. Further labwork at that time revealed an ESR of 29. HIV, Hep C ab, TSH, anti-CCP, uric acid, ANA, RF, anti-dsDNA, anti-smith, and CK were all within normal limits at that time. A repeat ESR in March 2017 was 9.  Patient was not seen again in clinic until April 2018. At that visit, he reported self-dosing his Prednisone as needed for flare ups. He would restart at 15 mg and taper down over 3 weeks by 5 mg until he is off. Symptoms would reoccur within 2-3 weeks and he would repeat this dosing regimen. On last visit, he was advised to take Prednisone 15 mg daily for 2 weeks and taper down by 2.5 mg every 2 weeks until down to 10 mg daily with plans to slowly taper by 1 mg monthly afterwards. Patient states that he has been taking 10 mg of Prednisone daily since that visit. He feels that his strength is normal with only complaint of left shoulder soreness which appears to be a chronic issue per chart review.  He expresses a lot of fear of medication side effects ever since the unfortunate passing of his wife several years ago after receiving chemotherapy for stage IV breast cancer.   Pre-Diabetes: Hgb A1c in March 2017 was 6.5 while on chronic steroids. He has no symptoms of urinary frequency, polydipsia, or paraesthesias.  Elevated Blood Pressure: Patient's blood pressure was elevated on last visit to 155/82. He was prescribed Lisinopril 20 mg daily at the time. Patient  states that he has not picked this up. His blood pressure this visit is 130/75.   Past Medical History:  Diagnosis Date  . Erectile dysfunction   . Polyarthritis     Review of Systems:   Review of Systems  Constitutional: Negative for chills and fever.  Respiratory: Negative for shortness of breath.   Cardiovascular: Negative for chest pain and leg swelling.  Gastrointestinal: Negative for abdominal pain, diarrhea, nausea and vomiting.  Musculoskeletal: Negative for myalgias.       Left shoulder soreness  Neurological: Negative for headaches.     Physical Exam:  Vitals:   07/08/16 1609  BP: 130/75  Pulse: 75  Temp: 98.5 F (36.9 C)  TempSrc: Oral  SpO2: 98%  Weight: 207 lb (93.9 kg)   Physical Exam  Constitutional: He is oriented to person, place, and time. He appears well-developed and well-nourished. No distress.  Neck: Normal range of motion.  Cardiovascular: Normal rate and regular rhythm.   No murmur heard. Pulmonary/Chest: Effort normal. No respiratory distress. He has no wheezes. He has no rales.  Musculoskeletal: He exhibits no edema or tenderness.  Strength 5/5 all extremities including hip flexors and shoulder shrug. No tenderness to palpation at the temples or palpable cord.  Neurological: He is alert and oriented to person, place, and time.  Skin: Skin is warm. He is not diaphoretic.    Assessment & Plan:   See Encounters Tab for problem based charting.  Patient discussed with Dr. Angelia Mould  Polyarthritis Patient with chronic steroid use for polyarthritis suspected secondary to polymyalgia rheumatica. He has not been taking Prednisone as prescribed, instead self-dosing for flare ups with quick tapers.   He is appropriately concerned about side effects from steroid use. I am concerned that the flare up symptoms he feels may be related to quickly tapering off steroids after chronic use. I had a long discussion with him regarding the side effects of  prednisone use as well as side effects of quick tapers. I have asked him to continue his Prednisone 10 mg daily for the next 4 weeks, after which we will begin a very slow taper by 1 mg per month going forward. Ideally, if he is able to adhere to a slow taper regimen without reoccurrence of symptoms, we may be able to get him of steroids altogether or at least to the lowest dose possible. He will need to follow up in 1 month for future refills. He expresses understanding and says he will try this. - Continue Prednisone 10 mg once daily for 1 month - Begin slow taper by 1 mg per month after next visit (will need prescription for 1 mg tablets of prednisone if he follows up) - Advised to take Calcium-Vitamin D supplements for bone health - Advised to follow up with our financial counselor for orange card - Patient provided information about Prednisone in AVS  Pre-diabetes Repeat Hgb A1c is 6.0 today improved from 6.5 last year. He is on chronic steroids and should have continued monitoring for diabetes while on prednisone. - Continue to monitor  Elevated blood pressure reading without diagnosis of hypertension Blood pressure is improved this visit at 130/75 while off of anti-hypertensives. He is wanting to avoid medications if possible. I will recommend continued monitoring and lifestyle modifications given his currently stable BP.

## 2016-07-08 NOTE — Patient Instructions (Addendum)
It was a pleasure to meet you [name].  Please continue the Prednisone 10 mg once a day.  Take Vitamin-D Calcium supplements to help maintain bone health.  Your blood pressure and Hemoglobin A1c look good today.  Follow up with [name] in 4 weeks. We will start to slowly decrease your Prednisone by 1 mg per month, hopefully to completely come off.   Prednisone tablets What is this medicine? PREDNISONE (PRED ni sone) is a corticosteroid. It is commonly used to treat inflammation of the skin, joints, lungs, and other organs. Common conditions treated include asthma, allergies, and arthritis. It is also used for other conditions, such as blood disorders and diseases of the adrenal glands. This medicine may be used for other purposes; ask your health care provider or pharmacist if you have questions. COMMON BRAND NAME(S): Deltasone, Predone, Sterapred, Sterapred DS What should I tell my health care provider before I take this medicine? They need to know if you have any of these conditions: -Cushing's syndrome -diabetes -glaucoma -heart disease -high blood pressure -infection (especially a virus infection such as chickenpox, cold sores, or herpes) -kidney disease -liver disease -mental illness -myasthenia gravis -osteoporosis -seizures -stomach or intestine problems -thyroid disease -an unusual or allergic reaction to lactose, prednisone, other medicines, foods, dyes, or preservatives -pregnant or trying to get pregnant -breast-feeding How should I use this medicine? Take this medicine by mouth with a glass of water. Follow the directions on the prescription label. Take this medicine with food. If you are taking this medicine once a day, take it in the morning. Do not take more medicine than you are told to take. Do not suddenly stop taking your medicine because you may develop a severe reaction. Your doctor will tell you how much medicine to take. If your doctor wants you to stop the  medicine, the dose may be slowly lowered over time to avoid any side effects. Talk to your pediatrician regarding the use of this medicine in children. Special care may be needed. Overdosage: If you think you have taken too much of this medicine contact a poison control center or emergency room at once. NOTE: This medicine is only for you. Do not share this medicine with others. What if I miss a dose? If you miss a dose, take it as soon as you can. If it is almost time for your next dose, talk to your doctor or health care professional. You may need to miss a dose or take an extra dose. Do not take double or extra doses without advice. What may interact with this medicine? Do not take this medicine with any of the following medications: -metyrapone -mifepristone This medicine may also interact with the following medications: -aminoglutethimide -amphotericin B -aspirin and aspirin-like medicines -barbiturates -certain medicines for diabetes, like glipizide or glyburide -cholestyramine -cholinesterase inhibitors -cyclosporine -digoxin -diuretics -ephedrine -male hormones, like estrogens and birth control pills -isoniazid -ketoconazole -NSAIDS, medicines for pain and inflammation, like ibuprofen or naproxen -phenytoin -rifampin -toxoids -vaccines -warfarin This list may not describe all possible interactions. Give your health care provider a list of all the medicines, herbs, non-prescription drugs, or dietary supplements you use. Also tell them if you smoke, drink alcohol, or use illegal drugs. Some items may interact with your medicine. What should I watch for while using this medicine? Visit your doctor or health care professional for regular checks on your progress. If you are taking this medicine over a prolonged period, carry an identification card with your name and  address, the type and dose of your medicine, and your doctor's name and address. This medicine may increase your  risk of getting an infection. Tell your doctor or health care professional if you are around anyone with measles or chickenpox, or if you develop sores or blisters that do not heal properly. If you are going to have surgery, tell your doctor or health care professional that you have taken this medicine within the last twelve months. Ask your doctor or health care professional about your diet. You may need to lower the amount of salt you eat. This medicine may affect blood sugar levels. If you have diabetes, check with your doctor or health care professional before you change your diet or the dose of your diabetic medicine. What side effects may I notice from receiving this medicine? Side effects that you should report to your doctor or health care professional as soon as possible: -allergic reactions like skin rash, itching or hives, swelling of the face, lips, or tongue -changes in emotions or moods -changes in vision -depressed mood -eye pain -fever or chills, cough, sore throat, pain or difficulty passing urine -increased thirst -swelling of ankles, feet Side effects that usually do not require medical attention (report to your doctor or health care professional if they continue or are bothersome): -confusion, excitement, restlessness -headache -nausea, vomiting -skin problems, acne, thin and shiny skin -trouble sleeping -weight gain This list may not describe all possible side effects. Call your doctor for medical advice about side effects. You may report side effects to FDA at 1-800-FDA-1088. Where should I keep my medicine? Keep out of the reach of children. Store at room temperature between 15 and 30 degrees C (59 and 86 degrees F). Protect from light. Keep container tightly closed. Throw away any unused medicine after the expiration date. NOTE: This sheet is a summary. It may not cover all possible information. If you have questions about this medicine, talk to your doctor,  pharmacist, or health care provider.  2018 Elsevier/Gold Standard (2010-10-02 10:57:14)

## 2016-07-08 NOTE — Assessment & Plan Note (Signed)
Blood pressure is improved this visit at 130/75 while off of anti-hypertensives. He is wanting to avoid medications if possible. I will recommend continued monitoring and lifestyle modifications given his currently stable BP.

## 2016-07-14 NOTE — Progress Notes (Signed)
Internal Medicine Clinic Attending  Case discussed with Dr. Zada Finders at the time of the visit.  We reviewed the resident's history and exam and pertinent patient test results.  I agree with the assessment, diagnosis, and plan of care documented in the resident's note. 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  After that time it does appear that he received benefit from corticosteroids but overall his case appears somewhat atypical as far as his presentation, joints involved and symptom response.  I believe there may have been some confirmation bias in his diagnosis however it is also possible that he truly has PMR.  Unfortunately [name] was lost to follow up and was intermittently self titration his steroids so it is somewhat difficult to see his true response.  We will need to titrate his steroids down in a controlled manner and monitor closely for symptoms.

## 2017-07-16 ENCOUNTER — Telehealth: Payer: Self-pay | Admitting: *Deleted

## 2017-07-16 NOTE — Telephone Encounter (Signed)
Received faxed refill request from pt's pharmacy for :  Prednisone 5mg  tabs Take 3 tablets by mouth once daily with breakfast for 14 days then decrease to take 2 & 1/2  (two & one-half) tablets once daily. #90  Last seen 80yr ago-no future appts scheduled-will send to pcp for review, please advise.Despina Hidden Cassady5/17/201911:52 AM

## 2017-07-16 NOTE — Telephone Encounter (Signed)
Refill is not appropriate. He will need an appointment for evaluation to clarify his medical condition if having symptoms or requesting further steroid prescriptions.

## 2017-08-02 ENCOUNTER — Encounter: Payer: Self-pay | Admitting: Internal Medicine

## 2017-08-02 ENCOUNTER — Ambulatory Visit: Payer: Self-pay

## 2017-09-21 ENCOUNTER — Encounter: Payer: Self-pay | Admitting: *Deleted

## 2018-08-21 ENCOUNTER — Encounter: Payer: Self-pay | Admitting: *Deleted

## 2018-12-27 ENCOUNTER — Other Ambulatory Visit: Payer: Self-pay

## 2018-12-27 ENCOUNTER — Encounter (HOSPITAL_COMMUNITY): Payer: Self-pay | Admitting: Emergency Medicine

## 2018-12-27 DIAGNOSIS — R7303 Prediabetes: Secondary | ICD-10-CM | POA: Diagnosis present

## 2018-12-27 DIAGNOSIS — M130 Polyarthritis, unspecified: Secondary | ICD-10-CM | POA: Diagnosis present

## 2018-12-27 DIAGNOSIS — B954 Other streptococcus as the cause of diseases classified elsewhere: Secondary | ICD-10-CM | POA: Diagnosis present

## 2018-12-27 DIAGNOSIS — Z20828 Contact with and (suspected) exposure to other viral communicable diseases: Secondary | ICD-10-CM | POA: Diagnosis present

## 2018-12-27 DIAGNOSIS — I890 Lymphedema, not elsewhere classified: Secondary | ICD-10-CM | POA: Diagnosis present

## 2018-12-27 DIAGNOSIS — Z79899 Other long term (current) drug therapy: Secondary | ICD-10-CM

## 2018-12-27 DIAGNOSIS — L03115 Cellulitis of right lower limb: Secondary | ICD-10-CM | POA: Diagnosis present

## 2018-12-27 DIAGNOSIS — M109 Gout, unspecified: Secondary | ICD-10-CM | POA: Diagnosis present

## 2018-12-27 DIAGNOSIS — K5900 Constipation, unspecified: Secondary | ICD-10-CM | POA: Diagnosis present

## 2018-12-27 DIAGNOSIS — M71061 Abscess of bursa, right knee: Principal | ICD-10-CM | POA: Diagnosis present

## 2018-12-27 NOTE — ED Triage Notes (Signed)
Pt sent from [facility] for rule out DVT/cellulitis for right knee and lower leg swelling. Reports twisted it but no injuries or falls.

## 2018-12-28 ENCOUNTER — Encounter (HOSPITAL_COMMUNITY)
Admission: EM | Disposition: A | Discharge status: Home / Self Care | Payer: Self-pay | Source: Ambulatory Visit | Attending: Family Medicine

## 2018-12-28 ENCOUNTER — Ambulatory Visit: Admit: 2018-12-28 | Payer: Self-pay | Admitting: Orthopaedic Surgery

## 2018-12-28 ENCOUNTER — Encounter (HOSPITAL_COMMUNITY): Payer: Self-pay

## 2018-12-28 ENCOUNTER — Observation Stay (HOSPITAL_COMMUNITY): Payer: Self-pay | Admitting: Certified Registered Nurse Anesthetist

## 2018-12-28 ENCOUNTER — Emergency Department (HOSPITAL_COMMUNITY): Payer: Self-pay

## 2018-12-28 ENCOUNTER — Inpatient Hospital Stay (HOSPITAL_COMMUNITY)
Admission: EM | Admit: 2018-12-28 | Discharge: 2019-01-04 | DRG: 501 | Disposition: A | Discharge status: Home / Self Care | Payer: Self-pay | Source: Ambulatory Visit | Attending: Internal Medicine | Admitting: Internal Medicine

## 2018-12-28 DIAGNOSIS — L02419 Cutaneous abscess of limb, unspecified: Secondary | ICD-10-CM | POA: Diagnosis present

## 2018-12-28 DIAGNOSIS — M25469 Effusion, unspecified knee: Secondary | ICD-10-CM

## 2018-12-28 DIAGNOSIS — R509 Fever, unspecified: Secondary | ICD-10-CM

## 2018-12-28 DIAGNOSIS — M71161 Other infective bursitis, right knee: Secondary | ICD-10-CM | POA: Diagnosis present

## 2018-12-28 DIAGNOSIS — L03115 Cellulitis of right lower limb: Secondary | ICD-10-CM | POA: Diagnosis present

## 2018-12-28 DIAGNOSIS — R7303 Prediabetes: Secondary | ICD-10-CM | POA: Diagnosis present

## 2018-12-28 HISTORY — DX: Prediabetes: R73.03

## 2018-12-28 HISTORY — PX: IRRIGATION AND DEBRIDEMENT KNEE: SHX5185

## 2018-12-28 LAB — CBC WITH DIFFERENTIAL/PLATELET
Abs Immature Granulocytes: 0.08 10*3/uL — ABNORMAL HIGH (ref 0.00–0.07)
Basophils Absolute: 0.1 10*3/uL (ref 0.0–0.1)
Basophils Relative: 0 %
Eosinophils Absolute: 0 10*3/uL (ref 0.0–0.5)
Eosinophils Relative: 0 %
HCT: 43 % (ref 39.0–52.0)
Hemoglobin: 14.5 g/dL (ref 13.0–17.0)
Immature Granulocytes: 1 %
Lymphocytes Relative: 7 %
Lymphs Abs: 1.1 10*3/uL (ref 0.7–4.0)
MCH: 32 pg (ref 26.0–34.0)
MCHC: 33.7 g/dL (ref 30.0–36.0)
MCV: 94.9 fL (ref 80.0–100.0)
Monocytes Absolute: 0.9 10*3/uL (ref 0.1–1.0)
Monocytes Relative: 6 %
Neutro Abs: 13.5 10*3/uL — ABNORMAL HIGH (ref 1.7–7.7)
Neutrophils Relative %: 86 %
Platelets: 244 10*3/uL (ref 150–400)
RBC: 4.53 MIL/uL (ref 4.22–5.81)
RDW: 12.4 % (ref 11.5–15.5)
WBC: 15.6 10*3/uL — ABNORMAL HIGH (ref 4.0–10.5)
nRBC: 0 % (ref 0.0–0.2)

## 2018-12-28 LAB — SARS CORONAVIRUS 2 (TAT 6-24 HRS): SARS Coronavirus 2: NEGATIVE

## 2018-12-28 LAB — COMPREHENSIVE METABOLIC PANEL
ALT: 42 U/L (ref 0–44)
AST: 44 U/L — ABNORMAL HIGH (ref 15–41)
Albumin: 4.5 g/dL (ref 3.5–5.0)
Alkaline Phosphatase: 56 U/L (ref 38–126)
Anion gap: 10 (ref 5–15)
BUN: 15 mg/dL (ref 8–23)
CO2: 25 mmol/L (ref 22–32)
Calcium: 9.2 mg/dL (ref 8.9–10.3)
Chloride: 101 mmol/L (ref 98–111)
Creatinine, Ser: 1.18 mg/dL (ref 0.61–1.24)
GFR calc Af Amer: >60 mL/min (ref >60)
GFR calc non Af Amer: >60 mL/min (ref >60)
Glucose, Bld: 100 mg/dL — ABNORMAL HIGH (ref 70–99)
Potassium: 4.5 mmol/L (ref 3.5–5.1)
Sodium: 136 mmol/L (ref 135–145)
Total Bilirubin: 1.8 mg/dL — ABNORMAL HIGH (ref 0.3–1.2)
Total Protein: 8.2 g/dL — ABNORMAL HIGH (ref 6.5–8.1)

## 2018-12-28 LAB — HIV ANTIBODY (ROUTINE TESTING W REFLEX): HIV Screen 4th Generation wRfx: NONREACTIVE

## 2018-12-28 LAB — SEDIMENTATION RATE: Sed Rate: 17 mm/hr — ABNORMAL HIGH (ref 0–16)

## 2018-12-28 LAB — C-REACTIVE PROTEIN: CRP: 10.4 mg/dL — ABNORMAL HIGH (ref <1.0)

## 2018-12-28 LAB — URIC ACID: Uric Acid, Serum: 7.5 mg/dL (ref 3.7–8.6)

## 2018-12-28 IMAGING — CR DG KNEE COMPLETE 4+V*R*
4 series · 4 of 4 positions shown · non-contrast
Comparison: None.

CLINICAL DATA: Knee pain and swelling, no known injury, initial
encounter

EXAM:
RIGHT KNEE - COMPLETE 4+ VIEW

[x knee ap right]
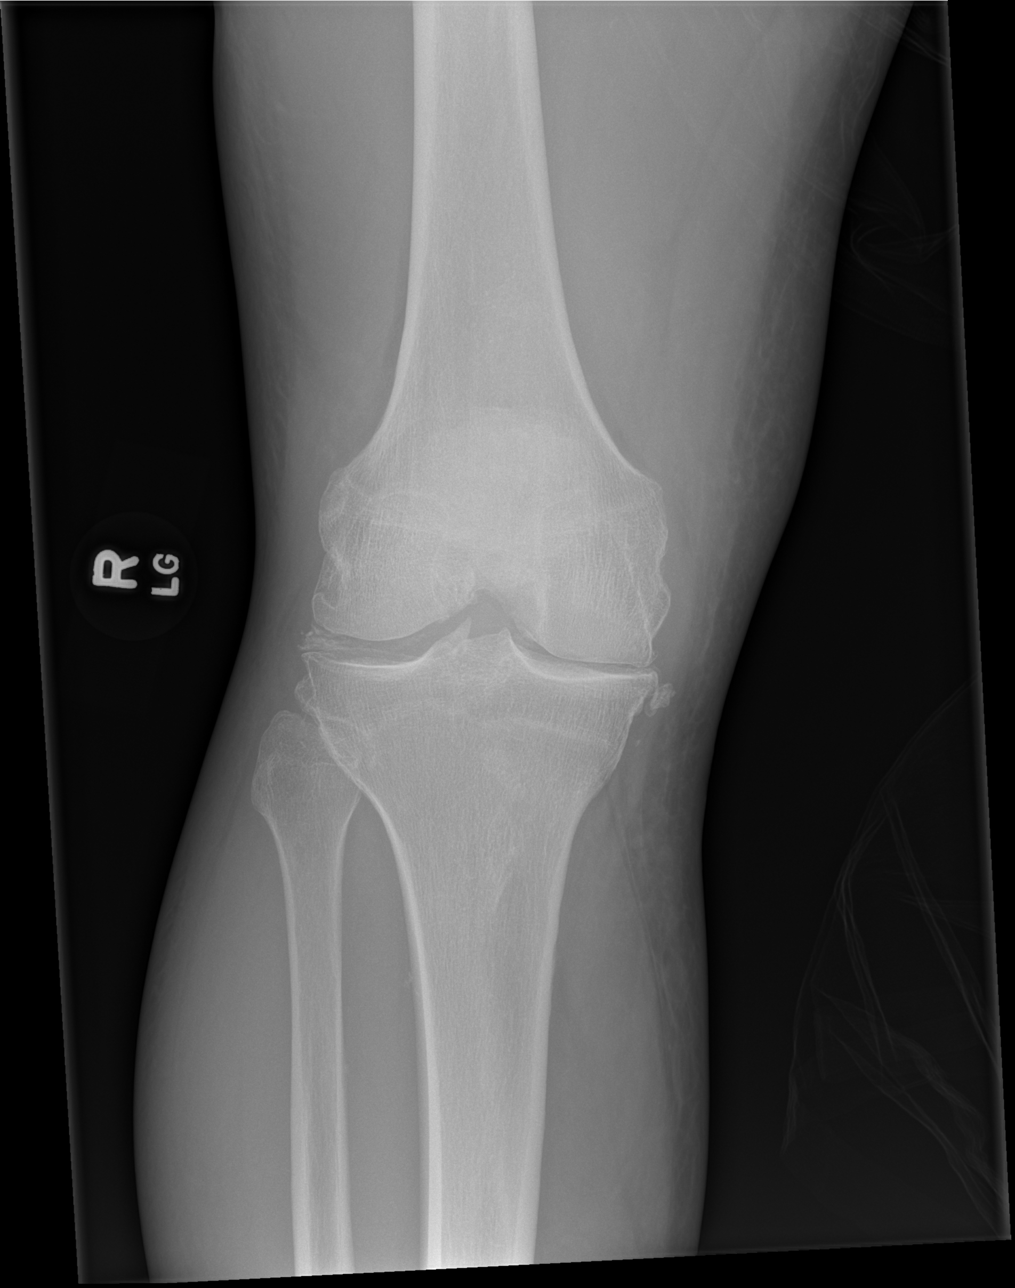

[x knee obl right (1 of 2)]
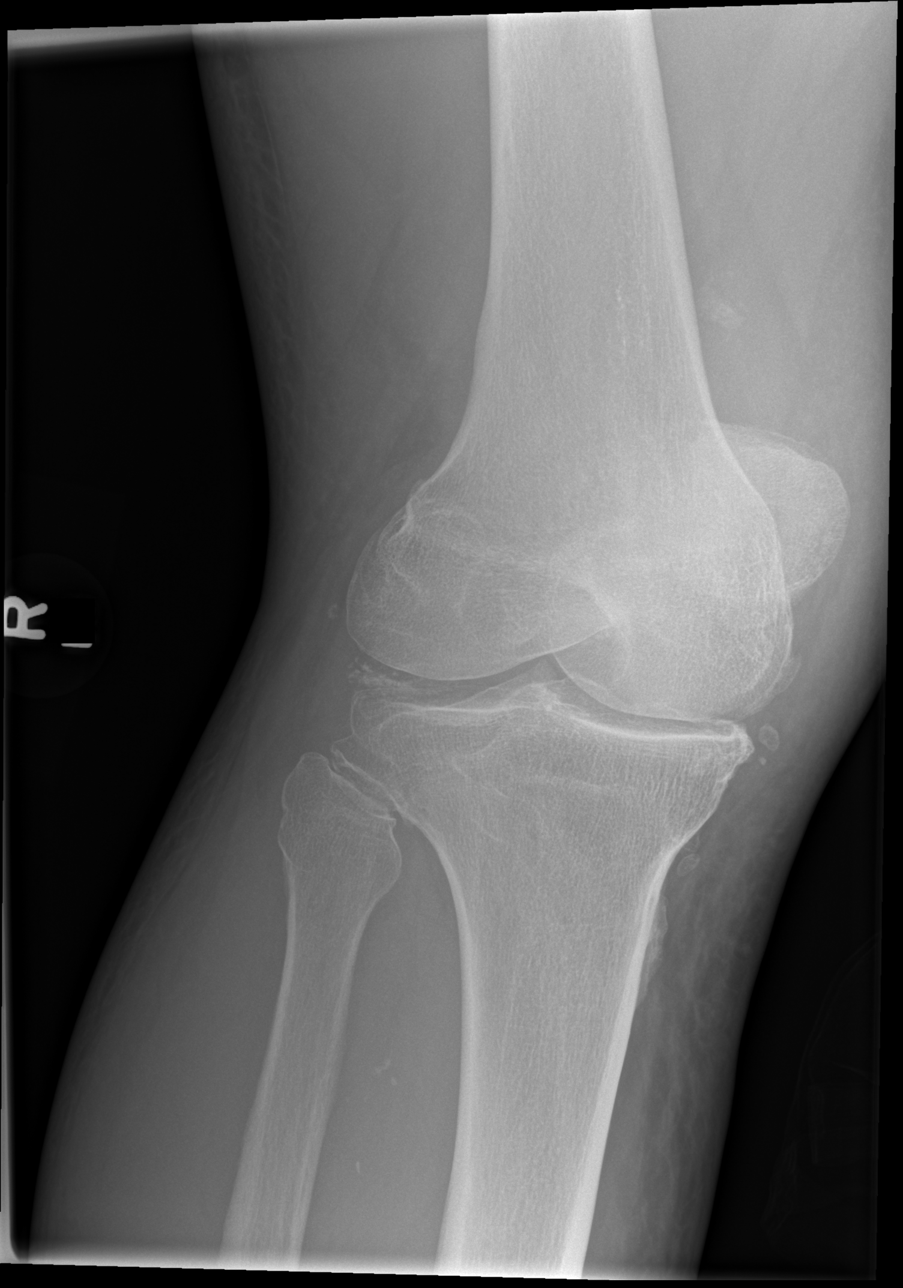

[x knee obl right (2 of 2)]
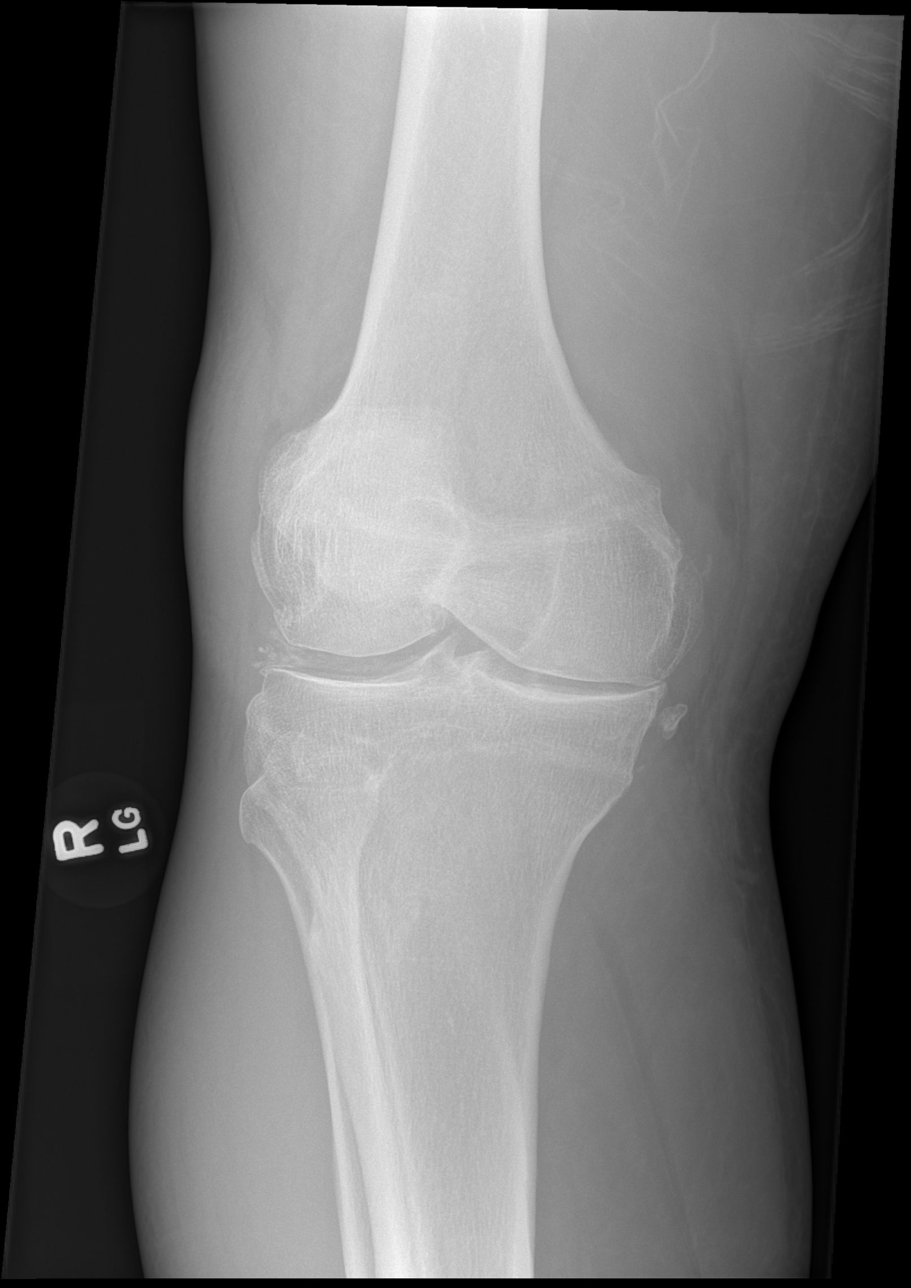

[x knee lat right]
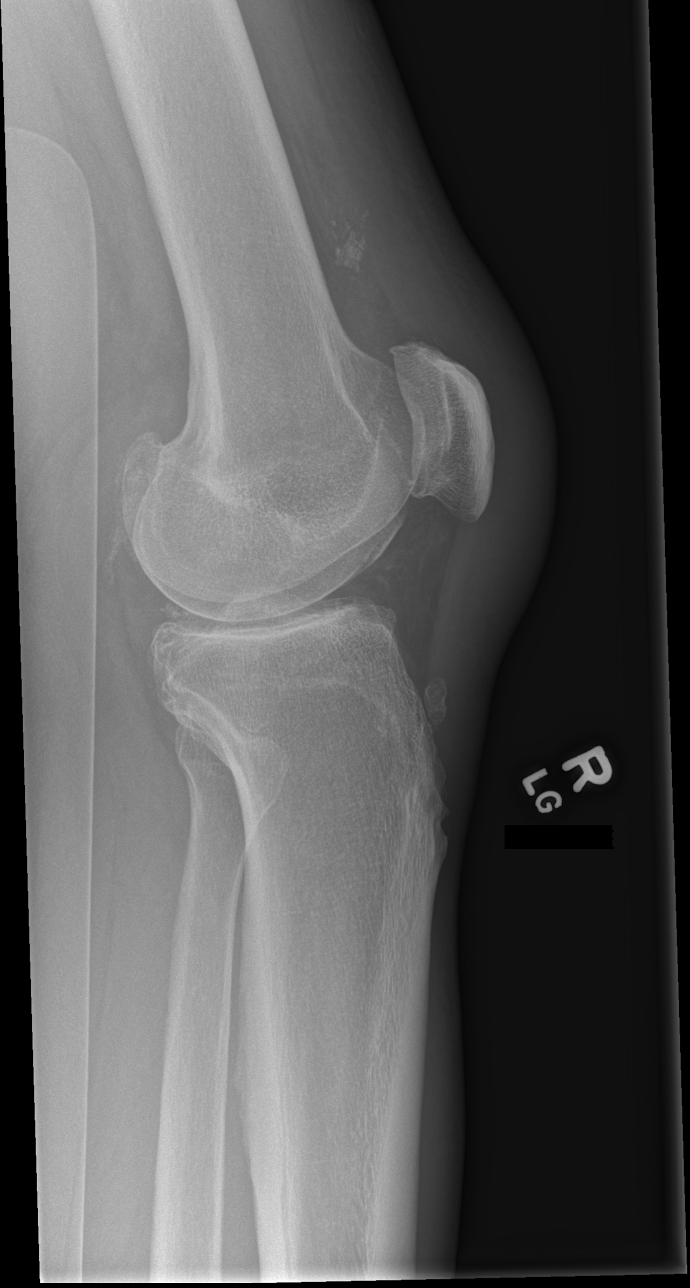

[4 of 4 positions shown; findings below may reference images not displayed]

FINDINGS: Tricompartmental degenerative changes are noted. No joint effusion
is seen. Soft tissue swelling is noted over the patella. No acute
fracture is seen. Meniscal calcifications are noted.
IMPRESSION: Chronic changes without acute abnormality.

Overlying soft tissue swelling is seen.

## 2018-12-28 SURGERY — IRRIGATION AND DEBRIDEMENT KNEE
Anesthesia: General | Site: Knee | Laterality: Right

## 2018-12-28 MED ORDER — ONDANSETRON HCL 4 MG/2ML IJ SOLN
INTRAMUSCULAR | Status: DC | PRN
  Administered 2018-12-28: 4 mg via INTRAVENOUS

## 2018-12-28 MED ORDER — OXYCODONE HCL 5 MG PO TABS
5.0000 mg | ORAL_TABLET | ORAL | Status: DC | PRN
  Administered 2018-12-29 – 2019-01-01 (×13): 10 mg via ORAL
  Administered 2019-01-02: 5 mg via ORAL
  Administered 2019-01-02 – 2019-01-03 (×7): 10 mg via ORAL
  Filled 2018-12-28 (×22): qty 2

## 2018-12-28 MED ORDER — IBUPROFEN 200 MG PO TABS
400.0000 mg | ORAL_TABLET | Freq: Four times a day (QID) | ORAL | Status: DC | PRN
  Administered 2018-12-28 – 2019-01-01 (×3): 400 mg via ORAL
  Filled 2018-12-28 (×3): qty 2

## 2018-12-28 MED ORDER — VANCOMYCIN HCL 10 G IV SOLR
1750.0000 mg | INTRAVENOUS | Status: DC
  Administered 2018-12-28: 1750 mg via INTRAVENOUS
  Filled 2018-12-28 (×2): qty 1750

## 2018-12-28 MED ORDER — ONDANSETRON HCL 4 MG PO TABS: 4.0000 mg | ORAL_TABLET | Freq: Four times a day (QID) | ORAL | Status: DC | PRN

## 2018-12-28 MED ORDER — ACETAMINOPHEN 650 MG RE SUPP
650.0000 mg | Freq: Four times a day (QID) | RECTAL | Status: DC | PRN
  Filled 2018-12-28: qty 1

## 2018-12-28 MED ORDER — FENTANYL CITRATE (PF) 100 MCG/2ML IJ SOLN
INTRAMUSCULAR | Status: AC
  Filled 2018-12-28: qty 2

## 2018-12-28 MED ORDER — CEFAZOLIN SODIUM-DEXTROSE 1-4 GM/50ML-% IV SOLN
1.0000 g | Freq: Three times a day (TID) | INTRAVENOUS | Status: DC
  Administered 2018-12-28 – 2018-12-29 (×4): 1 g via INTRAVENOUS
  Filled 2018-12-28 (×7): qty 50

## 2018-12-28 MED ORDER — DEXAMETHASONE SODIUM PHOSPHATE 10 MG/ML IJ SOLN
INTRAMUSCULAR | Status: AC
  Filled 2018-12-28: qty 1

## 2018-12-28 MED ORDER — ONDANSETRON HCL 4 MG/2ML IJ SOLN: 4.0000 mg | Freq: Four times a day (QID) | INTRAMUSCULAR | Status: DC | PRN

## 2018-12-28 MED ORDER — SODIUM CHLORIDE 0.9 % IR SOLN
Status: DC | PRN
  Administered 2018-12-28: 3000 mL

## 2018-12-28 MED ORDER — ACETAMINOPHEN 325 MG PO TABS
650.0000 mg | ORAL_TABLET | Freq: Four times a day (QID) | ORAL | Status: DC | PRN
  Administered 2018-12-28 – 2019-01-02 (×7): 650 mg via ORAL
  Filled 2018-12-28 (×7): qty 2

## 2018-12-28 MED ORDER — VANCOMYCIN HCL IN DEXTROSE 1-5 GM/200ML-% IV SOLN
1000.0000 mg | Freq: Once | INTRAVENOUS | Status: AC
  Administered 2018-12-28: 1000 mg via INTRAVENOUS
  Filled 2018-12-28: qty 200

## 2018-12-28 MED ORDER — ENOXAPARIN SODIUM 40 MG/0.4ML ~~LOC~~ SOLN
40.0000 mg | Freq: Every day | SUBCUTANEOUS | Status: DC
  Administered 2018-12-29 – 2019-01-04 (×6): 40 mg via SUBCUTANEOUS
  Filled 2018-12-28 (×7): qty 0.4

## 2018-12-28 MED ORDER — LIDOCAINE-EPINEPHRINE 2 %-1:100000 IJ SOLN
10.0000 mL | Freq: Once | INTRAMUSCULAR | Status: DC
  Filled 2018-12-28: qty 1

## 2018-12-28 MED ORDER — PROPOFOL 10 MG/ML IV BOLUS
INTRAVENOUS | Status: AC
  Filled 2018-12-28: qty 20

## 2018-12-28 MED ORDER — DEXAMETHASONE SODIUM PHOSPHATE 10 MG/ML IJ SOLN
INTRAMUSCULAR | Status: DC | PRN
  Administered 2018-12-28: 5 mg via INTRAVENOUS

## 2018-12-28 MED ORDER — INFLUENZA VAC SPLIT QUAD 0.5 ML IM SUSY: 0.5000 mL | PREFILLED_SYRINGE | INTRAMUSCULAR | Status: DC

## 2018-12-28 MED ORDER — MIDAZOLAM HCL 2 MG/2ML IJ SOLN
INTRAMUSCULAR | Status: AC
  Filled 2018-12-28: qty 2

## 2018-12-28 MED ORDER — HYDROMORPHONE HCL 1 MG/ML IJ SOLN
INTRAMUSCULAR | Status: AC
  Filled 2018-12-28: qty 1

## 2018-12-28 MED ORDER — MIDAZOLAM HCL 5 MG/5ML IJ SOLN
INTRAMUSCULAR | Status: DC | PRN
  Administered 2018-12-28: 1 mg via INTRAVENOUS

## 2018-12-28 MED ORDER — FENTANYL CITRATE (PF) 100 MCG/2ML IJ SOLN
INTRAMUSCULAR | Status: DC | PRN
  Administered 2018-12-28: 25 ug via INTRAVENOUS
  Administered 2018-12-28: 50 ug via INTRAVENOUS
  Administered 2018-12-28: 25 ug via INTRAVENOUS
  Administered 2018-12-28 (×2): 50 ug via INTRAVENOUS

## 2018-12-28 MED ORDER — OXYCODONE HCL 5 MG/5ML PO SOLN: 5.0000 mg | Freq: Once | ORAL | Status: AC | PRN

## 2018-12-28 MED ORDER — OXYCODONE HCL 5 MG PO TABS
5.0000 mg | ORAL_TABLET | Freq: Once | ORAL | Status: AC | PRN
  Administered 2018-12-28: 5 mg via ORAL

## 2018-12-28 MED ORDER — PROPOFOL 10 MG/ML IV BOLUS
INTRAVENOUS | Status: DC | PRN
  Administered 2018-12-28: 200 mg via INTRAVENOUS

## 2018-12-28 MED ORDER — HYDROMORPHONE HCL 1 MG/ML IJ SOLN
1.0000 mg | INTRAMUSCULAR | Status: DC | PRN
  Administered 2018-12-28 – 2019-01-02 (×13): 1 mg via INTRAVENOUS
  Filled 2018-12-28 (×13): qty 1

## 2018-12-28 MED ORDER — SUCCINYLCHOLINE CHLORIDE 200 MG/10ML IV SOSY
PREFILLED_SYRINGE | INTRAVENOUS | Status: DC | PRN
  Administered 2018-12-28: 120 mg via INTRAVENOUS

## 2018-12-28 MED ORDER — LACTATED RINGERS IV SOLN
INTRAVENOUS | Status: DC
  Administered 2018-12-28: 18:00:00 via INTRAVENOUS

## 2018-12-28 MED ORDER — CEFAZOLIN SODIUM-DEXTROSE 1-4 GM/50ML-% IV SOLN
1.0000 g | Freq: Once | INTRAVENOUS | Status: AC
  Administered 2018-12-28: 1 g via INTRAVENOUS
  Filled 2018-12-28: qty 50

## 2018-12-28 MED ORDER — ADULT MULTIVITAMIN W/MINERALS CH
1.0000 | ORAL_TABLET | Freq: Every day | ORAL | Status: DC
  Administered 2018-12-28 – 2019-01-04 (×8): 1 via ORAL
  Filled 2018-12-28 (×8): qty 1

## 2018-12-28 MED ORDER — LIDOCAINE 2% (20 MG/ML) 5 ML SYRINGE
INTRAMUSCULAR | Status: DC | PRN
  Administered 2018-12-28 (×2): 100 mg via INTRAVENOUS

## 2018-12-28 MED ORDER — ONDANSETRON HCL 4 MG/2ML IJ SOLN
INTRAMUSCULAR | Status: AC
  Filled 2018-12-28: qty 2

## 2018-12-28 MED ORDER — ENOXAPARIN SODIUM 40 MG/0.4ML ~~LOC~~ SOLN
40.0000 mg | Freq: Every day | SUBCUTANEOUS | Status: DC
  Filled 2018-12-28: qty 0.4

## 2018-12-28 MED ORDER — VANCOMYCIN HCL 10 G IV SOLR
1500.0000 mg | Freq: Once | INTRAVENOUS | Status: DC
  Filled 2018-12-28: qty 1500

## 2018-12-28 MED ORDER — HYDROMORPHONE HCL 1 MG/ML IJ SOLN
0.2500 mg | INTRAMUSCULAR | Status: DC | PRN
  Administered 2018-12-28 (×2): 0.5 mg via INTRAVENOUS

## 2018-12-28 MED ORDER — HYDROMORPHONE HCL 1 MG/ML IJ SOLN
1.0000 mg | Freq: Once | INTRAMUSCULAR | Status: AC
  Administered 2018-12-28: 1 mg via INTRAVENOUS
  Filled 2018-12-28: qty 1

## 2018-12-28 MED ORDER — FENTANYL CITRATE (PF) 100 MCG/2ML IJ SOLN: 25.0000 ug | INTRAMUSCULAR | Status: DC | PRN

## 2018-12-28 MED ORDER — OXYCODONE HCL 5 MG PO TABS
ORAL_TABLET | ORAL | Status: AC
  Filled 2018-12-28: qty 1

## 2018-12-28 SURGICAL SUPPLY — 47 items
ADH SKN CLS APL DERMABOND .7 (GAUZE/BANDAGES/DRESSINGS)
BAG SPEC THK2 15X12 ZIP CLS (MISCELLANEOUS)
BAG ZIPLOCK 12X15 (MISCELLANEOUS) ×1 IMPLANT
BANDAGE ESMARK 6X9 LF (GAUZE/BANDAGES/DRESSINGS) ×1 IMPLANT
BNDG CMPR 9X6 STRL LF SNTH (GAUZE/BANDAGES/DRESSINGS)
BNDG ELASTIC 6X5.8 VLCR STR LF (GAUZE/BANDAGES/DRESSINGS) ×2 IMPLANT
BNDG ESMARK 6X9 LF (GAUZE/BANDAGES/DRESSINGS)
COVER SURGICAL LIGHT HANDLE (MISCELLANEOUS) ×2 IMPLANT
COVER WAND RF STERILE (DRAPES) IMPLANT
CUFF TOURN SGL QUICK 34 (TOURNIQUET CUFF)
CUFF TRNQT CYL 34X4.125X (TOURNIQUET CUFF) ×1 IMPLANT
DERMABOND ADVANCED (GAUZE/BANDAGES/DRESSINGS)
DERMABOND ADVANCED .7 DNX12 (GAUZE/BANDAGES/DRESSINGS) ×1 IMPLANT
DRAPE SHEET LG 3/4 BI-LAMINATE (DRAPES) ×1 IMPLANT
DRSG ADAPTIC 3X8 NADH LF (GAUZE/BANDAGES/DRESSINGS) ×2 IMPLANT
DRSG AQUACEL AG ADV 3.5X10 (GAUZE/BANDAGES/DRESSINGS) ×1 IMPLANT
DRSG PAD ABDOMINAL 8X10 ST (GAUZE/BANDAGES/DRESSINGS) ×2 IMPLANT
DRSG TEGADERM 4X4.75 (GAUZE/BANDAGES/DRESSINGS) ×1 IMPLANT
DURAPREP 26ML APPLICATOR (WOUND CARE) ×2 IMPLANT
ELECT REM PT RETURN 15FT ADLT (MISCELLANEOUS) ×2 IMPLANT
EVACUATOR 1/8 PVC DRAIN (DRAIN) ×1 IMPLANT
GAUZE SPONGE 2X2 8PLY STRL LF (GAUZE/BANDAGES/DRESSINGS) ×1 IMPLANT
GAUZE SPONGE 4X4 12PLY STRL (GAUZE/BANDAGES/DRESSINGS) ×2 IMPLANT
GLOVE BIO SURGEON STRL SZ7.5 (GLOVE) ×2 IMPLANT
GLOVE BIOGEL PI IND STRL 8 (GLOVE) ×2 IMPLANT
GLOVE BIOGEL PI INDICATOR 8 (GLOVE) ×2
GLOVE ECLIPSE 8.0 STRL XLNG CF (GLOVE) ×6 IMPLANT
GOWN SPEC L3 XXLG W/TWL (GOWN DISPOSABLE) ×2 IMPLANT
GOWN STRL REUS W/TWL LRG LVL3 (GOWN DISPOSABLE) ×4 IMPLANT
HANDPIECE INTERPULSE COAX TIP (DISPOSABLE) ×2
KIT BASIN OR (CUSTOM PROCEDURE TRAY) ×2 IMPLANT
KIT TURNOVER KIT A (KITS) ×1 IMPLANT
MANIFOLD NEPTUNE II (INSTRUMENTS) ×2 IMPLANT
PACK TOTAL JOINT (CUSTOM PROCEDURE TRAY) ×2 IMPLANT
PADDING CAST COTTON 6X4 STRL (CAST SUPPLIES) ×3 IMPLANT
PROTECTOR NERVE ULNAR (MISCELLANEOUS) ×1 IMPLANT
SET HNDPC FAN SPRY TIP SCT (DISPOSABLE) ×1 IMPLANT
SPONGE GAUZE 2X2 STER 10/PKG (GAUZE/BANDAGES/DRESSINGS)
STAPLER VISISTAT 35W (STAPLE) ×1 IMPLANT
SUT ETHILON 2 0 PS N (SUTURE) ×2 IMPLANT
SUT MNCRL AB 4-0 PS2 18 (SUTURE) ×1 IMPLANT
SUT VIC AB 1 CT1 36 (SUTURE) ×2 IMPLANT
SUT VIC AB 2-0 CT1 27 (SUTURE)
SUT VIC AB 2-0 CT1 TAPERPNT 27 (SUTURE) ×3 IMPLANT
SWAB COLLECTION DEVICE MRSA (MISCELLANEOUS) ×1 IMPLANT
SWAB CULTURE ESWAB REG 1ML (MISCELLANEOUS) ×1 IMPLANT
TOWEL OR 17X26 10 PK STRL BLUE (TOWEL DISPOSABLE) ×3 IMPLANT

## 2018-12-28 NOTE — Consult Note (Signed)
Reason for Consult:  Right knee infection Referring Physician: Wyonia Hough, MD  [name] is an 63 y.o. male.  HPI: Patient is a very pleasant 63 year old gentleman who was admitted to the emergency room today after 24 to 48 hours of worsening right knee pain, redness and swelling.  He was admitted with cellulitis and started on IV antibiotics.  Orthopedic surgery was consulted due to his knee swelling and pain to rule out a septic joint.  The patient denies any recent injuries to his right knee and denies any recent illnesses.  He does have a remote surgery on the right knee over 30 years ago from a football injury.  He says the pain did become severe yesterday and it worsened to the point where he was having problems with bending the knee in terms of the pain it was causing.  When he was admitted he does have a peripheral white blood cell count of over 15,000.  Past Medical History:  Diagnosis Date  . Diabetes mellitus without complication ([place])    pt states no  . Erectile dysfunction   . Polyarthritis   . Pre-diabetes     Past Surgical History:  Procedure Laterality Date  . KNEE SURGERY    . ROTATOR CUFF REPAIR      No family history on file.  Social History:  reports that he has never smoked. He has never used smokeless tobacco. He reports that he does not drink alcohol or use drugs.  Allergies: No Known Allergies  Medications: I have reviewed the patient's current medications.  Results for orders placed or performed during the hospital encounter of 12/28/18 (from the past 48 hour(s))  CBC with Differential/Platelet     Status: Abnormal   Collection Time: 12/28/18  1:01 AM  Result Value Ref Range   WBC 15.6 (H) 4.0 - 10.5 K/uL   RBC 4.53 4.22 - 5.81 MIL/uL   Hemoglobin 14.5 13.0 - 17.0 g/dL   HCT 43.0 39.0 - 52.0 %   MCV 94.9 80.0 - 100.0 fL   MCH 32.0 26.0 - 34.0 pg   MCHC 33.7 30.0 - 36.0 g/dL   RDW 12.4 11.5 - 15.5 %   Platelets 244 150 - 400 K/uL   nRBC 0.0 0.0 -  0.2 %   Neutrophils Relative % 86 %   Neutro Abs 13.5 (H) 1.7 - 7.7 K/uL   Lymphocytes Relative 7 %   Lymphs Abs 1.1 0.7 - 4.0 K/uL   Monocytes Relative 6 %   Monocytes Absolute 0.9 0.1 - 1.0 K/uL   Eosinophils Relative 0 %   Eosinophils Absolute 0.0 0.0 - 0.5 K/uL   Basophils Relative 0 %   Basophils Absolute 0.1 0.0 - 0.1 K/uL   Immature Granulocytes 1 %   Abs Immature Granulocytes 0.08 (H) 0.00 - 0.07 K/uL    Comment: Performed at [facility], [place] [address], [place], [place] [address]  Comprehensive metabolic panel     Status: Abnormal   Collection Time: 12/28/18  1:01 AM  Result Value Ref Range   Sodium 136 135 - 145 mmol/L   Potassium 4.5 3.5 - 5.1 mmol/L   Chloride 101 98 - 111 mmol/L   CO2 25 22 - 32 mmol/L   Glucose, Bld 100 (H) 70 - 99 mg/dL   BUN 15 8 - 23 mg/dL   Creatinine, Ser 1.18 0.61 - 1.24 mg/dL   Calcium 9.2 8.9 - 10.3 mg/dL   Total Protein 8.2 (H) 6.5 - 8.1  g/dL   Albumin 4.5 3.5 - 5.0 g/dL   AST 44 (H) 15 - 41 U/L   ALT 42 0 - 44 U/L   Alkaline Phosphatase 56 38 - 126 U/L   Total Bilirubin 1.8 (H) 0.3 - 1.2 mg/dL   GFR calc non Af Amer >60 >60 mL/min   GFR calc Af Amer >60 >60 mL/min   Anion gap 10 5 - 15    Comment: Performed at [facility], [place] [address] [place], [place] [address]  Sedimentation rate     Status: Abnormal   Collection Time: 12/28/18  1:01 AM  Result Value Ref Range   Sed Rate 17 (H) 0 - 16 mm/hr    Comment: Performed at [facility], [place] [address] [place], [place] [address]  Uric acid     Status: None   Collection Time: 12/28/18  1:01 AM  Result Value Ref Range   Uric Acid, Serum 7.5 3.7 - 8.6 mg/dL    Comment: Performed at [facility], [place] [address], [place], [place] [address]  C-reactive protein     Status: Abnormal   Collection Time: 12/28/18  1:04 AM  Result Value Ref Range   CRP 10.4 (H) <1.0 mg/dL    Comment: Performed at [facility], [place] [address], [place], [place] [address]    Dg Knee Complete 4 Views Right  Result Date: 12/28/2018 CLINICAL DATA:  Knee pain and swelling, no known injury, initial encounter EXAM: RIGHT KNEE - COMPLETE 4+ VIEW COMPARISON:  None. FINDINGS: Tricompartmental degenerative changes are noted. No joint effusion is seen. Soft tissue swelling is noted over the patella. No acute fracture is seen. Meniscal calcifications are noted. IMPRESSION: Chronic changes without acute abnormality. Overlying soft tissue swelling is seen. Electronically Signed   By: Inez Catalina M.D.   On: 12/28/2018 01:55    ROS Blood pressure 135/66, pulse 80, temperature 99.2 F (37.3 C), temperature source Oral, resp. rate 18, SpO2 97 %. Physical Exam  Constitutional: He is oriented to person, place, and time. He appears well-developed and well-nourished.  Neck: Normal range of motion.  Cardiovascular: Normal rate.  Respiratory: Effort normal.  GI: Soft.  Musculoskeletal:     Right knee: He exhibits swelling and erythema. Tenderness found. Patellar tendon tenderness noted.  Neurological: He is alert and oriented to person, place, and time.  Skin: Skin is warm and dry.  Psychiatric: He has a normal mood and affect.   There is not an obvious joint effusion on the right knee but there is significant prepatellar swelling and pain to palpation and range of motion of the right knee.  There is associated cellulitis all around the knee itself.  His calf is soft and his foot is well-perfused on the right side.  Assessment/Plan: Right knee prepatellar bursa infection  At the bedside I was able to clean the knee with Betadine and alcohol and felt fullness in the prepatellar area so we did place an 18-gauge needle in that area.  We did remove purulent material from this area consistent with infection.  We will need to proceed to the OR this evening for an irrigation debridement of his right knee prepatellar area.   Since he did eat we need to wait until the end of the day today.  He is n.p.o. now.  I explained in detail while we are recommending surgery as well as the risk and benefits involved.  Mcarthur Rossetti 12/28/2018, 12:00  PM

## 2018-12-28 NOTE — ED Notes (Signed)
Arthrocentesis equipment and lidocaine-epinephrine.

## 2018-12-28 NOTE — Progress Notes (Signed)
Patient returned from PACU at 1952, report received at bedside by [facility]. Patient responds to voice and is easily aroused. No objective signs of pain while asleep. O2 at 3L [place], Sat 99%. VS obtained at 2000. R knee with surgical dressing intact, clean, and dry with ice pack in use. SCD's in use. Bed alarm initiated and call light placed within reach. Son at beside.

## 2018-12-28 NOTE — Anesthesia Procedure Notes (Signed)
Date/Time: 12/28/2018 6:53 PM Performed by: Cynda Familia, CRNA Oxygen Delivery Method: Simple face mask Placement Confirmation: positive ETCO2 and breath sounds checked- equal and bilateral Dental Injury: Teeth and Oropharynx as per pre-operative assessment

## 2018-12-28 NOTE — H&P (Signed)
History and Physical    [name] [id number] DOB: Apr 28, 1955 DOA: 12/28/2018  PCP: Patient, No Pcp Per  Patient coming from: Home  I have personally briefly reviewed patient's old medical records in [place]  Chief Complaint: Knee pain  HPI: [name] is a 63 y.o. male with medical history significant of arthritis, gout (single episode more than 10 years ago).  Patient presents to the ED with c/o R knee pain.  Onset about 10H PTA when he stomped on something earlier today.  Noted swelling to patellar region, pain redness and swelling then quickly spread after that.  Now has erythema about the entire R knee streaking up leg.   ED Course: Tm 100.6, WBC 15.6k.  Patient with some pain on ROM though EDP is able to get ROM of joint and therefore doesn't think this is c/w septic joint at this time.  Patient started on ancef for suspected cellulitis, hospitalist asked to admit.   Review of Systems: As per HPI, otherwise all review of systems negative.  Past Medical History:  Diagnosis Date  . Erectile dysfunction   . Polyarthritis     Past Surgical History:  Procedure Laterality Date  . KNEE SURGERY    . ROTATOR CUFF REPAIR       reports that he has never smoked. He has never used smokeless tobacco. He reports that he does not drink alcohol or use drugs.  No Known Allergies  No family history on file. No sick contacts  Prior to Admission medications   Medication Sig Start Date End Date Taking? Authorizing Provider  Multiple Vitamin (MULTIVITAMIN WITH MINERALS) TABS tablet Take 1 tablet by mouth daily.   Yes [provider]  sildenafil (REVATIO) 20 MG tablet Take 1 tablet (20 mg total) by mouth as needed. Prior to sexual activity. Patient not taking: Reported on 12/28/2018 06/01/16   Maryellen Pile, MD    Physical Exam: Vitals:   12/28/18 0330 12/28/18 0400 12/28/18 0430 12/28/18 0500  BP: 136/73 (!) 147/73 133/69 135/71  Pulse: 85 87  87 89  Resp: 19 (!) 21 (!) 22 (!) 22  Temp:      TempSrc:      SpO2: 95% 97% 94% 93%    Constitutional: NAD, calm, comfortable Eyes: PERRL, lids and conjunctivae normal ENMT: Mucous membranes are moist. Posterior pharynx clear of any exudate or lesions.Normal dentition.  Neck: normal, supple, no masses, no thyromegaly Respiratory: clear to auscultation bilaterally, no wheezing, no crackles. Normal respiratory effort. No accessory muscle use.  Cardiovascular: Regular rate and rhythm, no murmurs / rubs / gallops. No extremity edema. 2+ pedal pulses. No carotid bruits.  Abdomen: no tenderness, no masses palpated. No hepatosplenomegaly. Bowel sounds positive.  Musculoskeletal: R knee edematous, erythematous, and TTP, tenderness with ROM but able to get decent degree of passive ROM. Skin: no rashes, lesions, ulcers. No induration Neurologic: CN 2-12 grossly intact. Sensation intact, DTR normal. Strength 5/5 in all 4.  Psychiatric: Normal judgment and insight. Alert and oriented x 3. Normal mood.    Labs on Admission: I have personally reviewed following labs and imaging studies  CBC: Recent Labs  Lab 12/28/18 0101  WBC 15.6*  NEUTROABS 13.5*  HGB 14.5  HCT 43.0  MCV 94.9  PLT [id number]   Basic Metabolic Panel: Recent Labs  Lab 12/28/18 0101  NA 136  K 4.5  CL 101  CO2 25  GLUCOSE 100*  BUN 15  CREATININE 1.18  CALCIUM 9.2   GFR: CrCl  cannot be calculated (Unknown ideal weight.). Liver Function Tests: Recent Labs  Lab 12/28/18 0101  AST 44*  ALT 42  ALKPHOS 56  BILITOT 1.8*  PROT 8.2*  ALBUMIN 4.5   No results for input(s): LIPASE, AMYLASE in the last 168 hours. No results for input(s): AMMONIA in the last 168 hours. Coagulation Profile: No results for input(s): INR, PROTIME in the last 168 hours. Cardiac Enzymes: No results for input(s): CKTOTAL, CKMB, CKMBINDEX, TROPONINI in the last 168 hours. BNP (last 3 results) No results for input(s): PROBNP in the last  8760 hours. HbA1C: No results for input(s): HGBA1C in the last 72 hours. CBG: No results for input(s): GLUCAP in the last 168 hours. Lipid Profile: No results for input(s): CHOL, HDL, LDLCALC, TRIG, CHOLHDL, LDLDIRECT in the last 72 hours. Thyroid Function Tests: No results for input(s): TSH, T4TOTAL, FREET4, T3FREE, THYROIDAB in the last 72 hours. Anemia Panel: No results for input(s): VITAMINB12, FOLATE, FERRITIN, TIBC, IRON, RETICCTPCT in the last 72 hours. Urine analysis:    Component Value Date/Time   COLORURINE YELLOW 01/23/2007 0502   APPEARANCEUR CLEAR 01/23/2007 0502   LABSPEC 1.024 01/23/2007 0502   PHURINE 5.5 01/23/2007 0502   GLUCOSEU 100 (A) 01/23/2007 0502   HGBUR NEGATIVE 01/23/2007 0502   HGBUR POSITIVE POINT OF CARE RESULT (A) 01/23/2007 0502   BILIRUBINUR NEGATIVE 01/23/2007 0502   KETONESUR NEGATIVE 01/23/2007 0502   PROTEINUR NEGATIVE 01/23/2007 0502   UROBILINOGEN 0.2 01/23/2007 0502   NITRITE NEGATIVE 01/23/2007 0502   LEUKOCYTESUR  01/23/2007 0502    NEGATIVE MICROSCOPIC NOT DONE ON URINES WITH NEGATIVE PROTEIN, BLOOD, LEUKOCYTES, NITRITE, OR GLUCOSE <1000 mg/dL.    Radiological Exams on Admission: Dg Knee Complete 4 Views Right  Result Date: 12/28/2018 CLINICAL DATA:  Knee pain and swelling, no known injury, initial encounter EXAM: RIGHT KNEE - COMPLETE 4+ VIEW COMPARISON:  None. FINDINGS: Tricompartmental degenerative changes are noted. No joint effusion is seen. Soft tissue swelling is noted over the patella. No acute fracture is seen. Meniscal calcifications are noted. IMPRESSION: Chronic changes without acute abnormality. Overlying soft tissue swelling is seen. Electronically Signed   By: Inez Catalina M.D.   On: 12/28/2018 01:55    EKG: Independently reviewed.  Assessment/Plan Principal Problem:   Cellulitis of right knee    1. Cellulitis of R knee - 1. DDx includes gout and septic arthritis 1. EDP thought latter was less likely given ability  to passively ROM the knee 2. No joint effusion on X ray 3. Uric acid level pending 2. Continue Keflex for now 3. Call ortho for evaluation and opinion this AM re: R/O septic arthritis 4. Dilaudid PRN pain  DVT prophylaxis: Lovenox first dose 1400 Code Status: Full Family Communication: No family in room Disposition Plan: Home after admit Consults called: None Admission status: Place in 39   , [place] Hospitalists  How to contact the [facility] Attending or Consulting provider [place] or covering provider during after hours [place], for this patient?  1. Check the care team in [facility] and look for a) attending/consulting [facility] provider listed and b) the [facility] team listed 2. Log into [email or web address]  Amion Physician Scheduling and messaging for groups and whole hospitals  On call and physician scheduling software for group practices, residents, hospitalists and other medical providers for call, clinic, rotation and shift schedules. On[employer] is a hospital-wide system for scheduling doctors and paging doctors on call. EasyPlot is for scientific plotting and data analysis.  [email or web address]  and use [place]'s universal password to access. If you do not have the password, please contact the hospital operator.  3. Locate the [facility] provider you are looking for under [facility] and page to a number that you can be directly reached. 4. If you still have difficulty reaching the provider, please page the [facility] (Director on Call) for the Hospitalists listed on amion for assistance.  12/28/2018, 5:52 AM

## 2018-12-28 NOTE — Anesthesia Preprocedure Evaluation (Signed)
Anesthesia Evaluation  Patient identified by MRN, date of birth, ID band Patient awake    Reviewed: Allergy & Precautions, H&P , NPO status , Patient's Chart, lab work & pertinent test results  Airway Mallampati: II   Neck ROM: full    Dental   Pulmonary neg pulmonary ROS,    breath sounds clear to auscultation       Cardiovascular negative cardio ROS   Rhythm:regular Rate:Normal     Neuro/Psych    GI/Hepatic   Endo/Other  diabetes, Type 2  Renal/GU      Musculoskeletal  (+) Arthritis ,   Abdominal   Peds  Hematology   Anesthesia Other Findings   Reproductive/Obstetrics                             Anesthesia Physical Anesthesia Plan  ASA: II  Anesthesia Plan: General   Post-op Pain Management:    Induction: Intravenous  PONV Risk Score and Plan: 2 and Ondansetron, Dexamethasone, Midazolam and Treatment may vary due to age or medical condition  Airway Management Planned: Oral ETT  Additional Equipment:   Intra-op Plan:   Post-operative Plan: Extubation in OR  Informed Consent: I have reviewed the patients History and Physical, chart, labs and discussed the procedure including the risks, benefits and alternatives for the proposed anesthesia with the patient or authorized representative who has indicated his/her understanding and acceptance.       Plan Discussed with: CRNA, Anesthesiologist and Surgeon  Anesthesia Plan Comments:         Anesthesia Quick Evaluation

## 2018-12-28 NOTE — Progress Notes (Signed)
   12/28/18 0647  MEWS Score  Resp 16  Pulse Rate 85  BP 134/68  Temp (!) 102 F (38.9 C)  SpO2 98 %  O2 Device Room Air  MEWS Score  MEWS RR 0  MEWS Pulse 0  MEWS Systolic 0  MEWS LOC 0  MEWS Temp 2  MEWS Score 2  MEWS Score Color Yellow  MEWS Assessment  Is this an acute change? Yes  MEWS guidelines implemented *See Row Information* Yellow  Acute change in pt temperature. PRN and yellow mews assessments to follow.

## 2018-12-28 NOTE — Anesthesia Procedure Notes (Signed)
Procedure Name: Intubation Date/Time: 12/28/2018 6:15 PM Performed by: West Pugh, CRNA Pre-anesthesia Checklist: Patient identified, Emergency Drugs available, Suction available, Patient being monitored and Timeout performed Patient Re-evaluated:Patient Re-evaluated prior to induction Oxygen Delivery Method: Circle system utilized Preoxygenation: Pre-oxygenation with 100% oxygen Induction Type: IV induction, Rapid sequence and Cricoid Pressure applied Laryngoscope Size: Mac and 3 Tube type: Oral Tube size: 7.5 mm Number of attempts: 1 Airway Equipment and Method: Stylet Placement Confirmation: ETT inserted through vocal cords under direct vision,  positive ETCO2,  CO2 detector and breath sounds checked- equal and bilateral Secured at: 22 cm Tube secured with: Tape Dental Injury: Teeth and Oropharynx as per pre-operative assessment

## 2018-12-28 NOTE — Transfer of Care (Signed)
Immediate Anesthesia Transfer of Care Note  Patient: [name]  Procedure(s) Performed: IRRIGATION AND DEBRIDEMENT KNEE (Right Knee)  Patient Location: PACU  Anesthesia Type:General  Level of Consciousness: sedated  Airway & Oxygen Therapy: Patient Spontanous Breathing and Patient connected to face mask oxygen  Post-op Assessment: Report given to RN and Post -op Vital signs reviewed and stable  Post vital signs: Reviewed and stable  Last Vitals:  Vitals Value Taken Time  BP 124/105 12/28/18 1857  Temp    Pulse 89 12/28/18 1859  Resp 13 12/28/18 1859  SpO2 98 % 12/28/18 1859  Vitals shown include unvalidated device data.  Last Pain:  Vitals:   12/28/18 1740  TempSrc:   PainSc: 8       Patients Stated Pain Goal: 3 ([id number])  Complications: No apparent anesthesia complications

## 2018-12-28 NOTE — ED Provider Notes (Signed)
[facility] DEPT Provider Note  CSN: [id number] Arrival date & time: 12/27/18 1754  Chief Complaint(s) Knee Pain, Joint Swelling, and Leg Swelling  HPI [name] is a 63 y.o. male    Knee Pain Location:  Knee Time since incident: about 10 hrs. Lower extremity injury: stomped on something earlier today and then noted swelling to the patellar region. pain, swelling, and redness spread quickly after that.   Knee location:  R knee Pain details:    Quality:  Throbbing   Severity:  Severe   Onset quality:  Gradual   Timing:  Constant Chronicity:  New Dislocation: no   Prior injury to area:  Yes (>45 yrs ago) Relieved by:  Nothing Worsened by:  Bearing weight Associated symptoms: swelling   Associated symptoms: no back pain, no decreased ROM and no fever    Report remote history of gout.  No recent fevers or infections.  Past Medical History Past Medical History:  Diagnosis Date  . Erectile dysfunction   . Polyarthritis    Patient Active Problem List   Diagnosis Date Noted  . Pre-diabetes 07/08/2016  . Elevated blood pressure reading without diagnosis of hypertension 06/01/2016  . Dental caries 05/21/2015  . Thrombocytosis ([place]) 04/24/2015  . Erectile dysfunction 04/23/2015  . Healthcare maintenance 03/08/2015  . Polyarthritis 03/08/2015  . ROTATOR CUFF INJURY, LEFT SHOULDER 06/20/2009   Home Medication(s) Prior to Admission medications   Medication Sig Start Date End Date Taking? Authorizing Provider  Multiple Vitamin (MULTIVITAMIN WITH MINERALS) TABS tablet Take 1 tablet by mouth daily.   Yes [provider]  calcium-vitamin D (OSCAL WITH D) 500-200 MG-UNIT tablet Take 2 tablets by mouth daily with breakfast. Patient not taking: Reported on 12/28/2018 04/23/15   Juluis Mire, MD  ranitidine (ZANTAC 75) 75 MG tablet Take 2 tablets (150 mg total) by mouth 2 (two) times daily. Patient not taking: Reported on 12/28/2018  03/08/15   Iline Oven, MD  sildenafil (REVATIO) 20 MG tablet Take 1 tablet (20 mg total) by mouth as needed. Prior to sexual activity. Patient not taking: Reported on 12/28/2018 06/01/16   Maryellen Pile, MD                                                                                                                                    Past Surgical History Past Surgical History:  Procedure Laterality Date  . KNEE SURGERY    . ROTATOR CUFF REPAIR     Family History No family history on file.  Social History Social History   Tobacco Use  . Smoking status: Never Smoker  . Smokeless tobacco: Never Used  Substance Use Topics  . Alcohol use: No    Alcohol/week: 0.0 standard drinks  . Drug use: No   Allergies Patient has no known allergies.  Review of Systems Review of Systems  Constitutional: Negative for fever.  Musculoskeletal: Negative for back pain.  All other systems are reviewed and are negative for acute change except as noted in the HPI  Physical Exam Vital Signs  I have reviewed the triage vital signs BP 133/76 (BP Location: Left Arm)   Pulse 87   Temp 99.2 F (37.3 C) (Oral)   Resp 14   SpO2 100%   Physical Exam Vitals signs reviewed.  Constitutional:      General: He is not in acute distress.    Appearance: He is well-developed. He is not diaphoretic.  HENT:     Head: Normocephalic and atraumatic.     Jaw: No trismus.     Right Ear: External ear normal.     Left Ear: External ear normal.     Nose: Nose normal.  Eyes:     General: No scleral icterus.    Conjunctiva/sclera: Conjunctivae normal.  Neck:     Musculoskeletal: Normal range of motion.     Trachea: Phonation normal.  Cardiovascular:     Rate and Rhythm: Normal rate and regular rhythm.  Pulmonary:     Effort: Pulmonary effort is normal. No respiratory distress.     Breath sounds: No stridor.  Abdominal:     General: There is no distension.  Musculoskeletal: Normal range of  motion.     Right knee: He exhibits swelling and erythema. He exhibits normal range of motion and no deformity. Tenderness found.       Legs:  Neurological:     Mental Status: He is alert and oriented to person, place, and time.  Psychiatric:        Behavior: Behavior normal.     ED Results and Treatments Labs (all labs ordered are listed, but only abnormal results are displayed) Labs Reviewed  CBC WITH DIFFERENTIAL/PLATELET - Abnormal; Notable for the following components:      Result Value   WBC 15.6 (*)    Neutro Abs 13.5 (*)    Abs Immature Granulocytes 0.08 (*)    All other components within normal limits  COMPREHENSIVE METABOLIC PANEL - Abnormal; Notable for the following components:   Glucose, Bld 100 (*)    Total Protein 8.2 (*)    AST 44 (*)    Total Bilirubin 1.8 (*)    All other components within normal limits  SEDIMENTATION RATE - Abnormal; Notable for the following components:   Sed Rate 17 (*)    All other components within normal limits  C-REACTIVE PROTEIN - Abnormal; Notable for the following components:   CRP 10.4 (*)    All other components within normal limits  BODY FLUID CULTURE  GRAM STAIN  SARS CORONAVIRUS 2 (TAT 6-24 HRS)  GLUCOSE, BODY FLUID OTHER  PROTEIN, BODY FLUID (OTHER)  SYNOVIAL CELL COUNT + DIFF, W/ CRYSTALS                                                                                                                         EKG  EKG Interpretation  Date/Time:  Ventricular Rate:    PR Interval:    QRS Duration:   QT Interval:    QTC Calculation:   R Axis:     Text Interpretation:        Radiology Dg Knee Complete 4 Views Right  Result Date: 12/28/2018 CLINICAL DATA:  Knee pain and swelling, no known injury, initial encounter EXAM: RIGHT KNEE - COMPLETE 4+ VIEW COMPARISON:  None. FINDINGS: Tricompartmental degenerative changes are noted. No joint effusion is seen. Soft tissue swelling is noted over the patella. No  acute fracture is seen. Meniscal calcifications are noted. IMPRESSION: Chronic changes without acute abnormality. Overlying soft tissue swelling is seen. Electronically Signed   By: Inez Catalina M.D.   On: 12/28/2018 01:55    Pertinent labs & imaging results that were available during my care of the patient were reviewed by me and considered in my medical decision making (see chart for details).  Medications Ordered in ED Medications  lidocaine-EPINEPHrine (XYLOCAINE W/EPI) 2 %-1:100000 (with pres) injection 10 mL (has no administration in time range)  ceFAZolin (ANCEF) IVPB 1 g/50 mL premix (has no administration in time range)  HYDROmorphone (DILAUDID) injection 1 mg (1 mg Intravenous Given 12/28/18 0108)                                                                                                                                    Procedures Procedures  (including critical care time)  Medical Decision Making / ED Course I have reviewed the nursing notes for this encounter and the patient's prior records (if available in EHR or on provided paperwork).   Devyn Flocco was evaluated in Emergency Department on 12/28/2018 for the symptoms described in the history of present illness. He was evaluated in the context of the global COVID-19 pandemic, which necessitated consideration that the patient might be at risk for infection with the SARS-CoV-2 virus that causes COVID-19. Institutional protocols and algorithms that pertain to the evaluation of patients at risk for COVID-19 are in a state of rapid change based on information released by regulatory bodies including the CDC and federal and state organizations. These policies and algorithms were followed during the patient's care in the ED.  Most suspicious for cellulitis vs gout flare.  Given ability to range joint w/o significant discomfort, it is unlikely septic arthritis. Labs are noticeable for leukocytosis.  Plain film negative for bony  injury. Doubt DVT, but can get [name] inpatient if needed.  IV abx for cellulitis given the streaking erythema.  Will admit for additional IV Abx      Final Clinical Impression(s) / ED Diagnoses Final diagnoses:  Knee swelling  Cellulitis of right lower extremity   .   This chart was dictated using voice recognition software.  Despite best efforts to proofread,  errors can occur which can change the documentation meaning.   Fatima Blank, MD 12/28/18 [phone]

## 2018-12-28 NOTE — Progress Notes (Signed)
PROGRESS NOTE    [name]  [id number] DOB: Jul 07, 1955 DOA: 12/28/2018 PCP: Patient, No Pcp Per   Brief Narrative:  Per admitting physician: [name] is a 63 y.o. male with medical history significant of arthritis, gout (single episode more than 10 years ago). Patient presented to the ED with c/o R knee pain.  Onset about 10H PTA when he stomped on something earlier today. Noted swelling to patellar region, pain redness and swelling then quickly spread after that. Now has erythema about the entire R knee streaking up leg.  ED Course: Tm 100.6, WBC 15.6k.  Patient with some pain on ROM though EDP is able to get ROM of joint and therefore doesn't think this is c/w septic joint at this time. Patient started on ancef for suspected cellulitis, hospitalist asked to admit.   Assessment & Plan:   Principal Problem:   Cellulitis of right knee Active Problems:   Infected prepatellar bursa, right   Cellulitis right knee: Continue Ancef for now Follow cultures Check CBC in the morning  Possible septic joint: Was seen by orthopedics Plan to washout prepatellar in OR today May felt unlikely joint infection Will dose with vancomycin for now    DVT prophylaxis: Lovenox SQ  Code Status: Full    Code Status Orders  (From admission, onward)         Start     Ordered   12/28/18 0501  Full code  Continuous     12/28/18 0501        Code Status History    This patient has a current code status but no historical code status.   Advance Care Planning Activity     Family Communication: Discussed in detail with patient Disposition Plan:   Patient remained inpatient for continued IV antibiotics, anticipated intervention in OR for evaluation of infected prepatellar with cultures to be obtained.  Patient not medically ready for discharge Consults called: ortho Admission status: Inpatient   Consultants:   ortho  Procedures:  Dg Knee Complete 4 Views Right   Result Date: 12/28/2018 CLINICAL DATA:  Knee pain and swelling, no known injury, initial encounter EXAM: RIGHT KNEE - COMPLETE 4+ VIEW COMPARISON:  None. FINDINGS: Tricompartmental degenerative changes are noted. No joint effusion is seen. Soft tissue swelling is noted over the patella. No acute fracture is seen. Meniscal calcifications are noted. IMPRESSION: Chronic changes without acute abnormality. Overlying soft tissue swelling is seen. Electronically Signed   By: [name] M.D.   On: 12/28/2018 01:55     Antimicrobials:   Vancomycin 10/28   Subjective: Patient made overnight with right leg pain Reports pain still persists  Objective: Vitals:   12/28/18 0600 12/28/18 0647 12/28/18 0822 12/28/18 1418  BP: 128/71 134/68 135/66 (!) 148/68  Pulse: 87 85 80 92  Resp: (!) 34 16 18 18   Temp:  (!) 102 F (38.9 C) 99.2 F (37.3 C) (!) 101.6 F (38.7 C)  TempSrc:  Oral Oral Oral  SpO2: 91% 98% 97% 92%    Intake/Output Summary (Last 24 hours) at 12/28/2018 1450 Last data filed at 12/28/2018 1100 Gross per 24 hour  Intake 170 ml  Output 400 ml  Net -230 ml   There were no vitals filed for this visit.  Examination:  General exam: Appears calm and mildly uncomfortable  Respiratory system: Clear to auscultation. Respiratory effort normal. Cardiovascular system: S1 & S2 heard, RRR. No JVD, murmurs, rubs, gallops or clicks. No pedal edema. Gastrointestinal system: Abdomen is nondistended,  soft and nontender. No organomegaly or masses felt. Normal bowel sounds heard. Central nervous system: Alert and oriented. No focal neurological deficits. Extremities: Right lower extremity warmth and erythema, pain with range of motion, prepatellar edema r. Skin: Erythema as above otherwise no rashes, lesions or ulcers Psychiatry: Judgement and insight appear normal. Mood & affect appropriate.     Data Reviewed: I have personally reviewed following labs and imaging studies  CBC: Recent  Labs  Lab 12/28/18 0101  WBC 15.6*  NEUTROABS 13.5*  HGB 14.5  HCT 43.0  MCV 94.9  PLT [id number]   Basic Metabolic Panel: Recent Labs  Lab 12/28/18 0101  NA 136  K 4.5  CL 101  CO2 25  GLUCOSE 100*  BUN 15  CREATININE 1.18  CALCIUM 9.2   GFR: CrCl cannot be calculated (Unknown ideal weight.). Liver Function Tests: Recent Labs  Lab 12/28/18 0101  AST 44*  ALT 42  ALKPHOS 56  BILITOT 1.8*  PROT 8.2*  ALBUMIN 4.5   No results for input(s): LIPASE, AMYLASE in the last 168 hours. No results for input(s): AMMONIA in the last 168 hours. Coagulation Profile: No results for input(s): INR, PROTIME in the last 168 hours. Cardiac Enzymes: No results for input(s): CKTOTAL, CKMB, CKMBINDEX, TROPONINI in the last 168 hours. BNP (last 3 results) No results for input(s): PROBNP in the last 8760 hours. HbA1C: No results for input(s): HGBA1C in the last 72 hours. CBG: No results for input(s): GLUCAP in the last 168 hours. Lipid Profile: No results for input(s): CHOL, HDL, LDLCALC, TRIG, CHOLHDL, LDLDIRECT in the last 72 hours. Thyroid Function Tests: No results for input(s): TSH, T4TOTAL, FREET4, T3FREE, THYROIDAB in the last 72 hours. Anemia Panel: No results for input(s): VITAMINB12, FOLATE, FERRITIN, TIBC, IRON, RETICCTPCT in the last 72 hours. Sepsis Labs: No results for input(s): PROCALCITON, LATICACIDVEN in the last 168 hours.  Recent Results (from the past 240 hour(s))  SARS CORONAVIRUS 2 (TAT 6-24 HRS) Nasopharyngeal Nasopharyngeal Swab     Status: None   Collection Time: 12/28/18  4:53 AM   Specimen: Nasopharyngeal Swab  Result Value Ref Range Status   SARS Coronavirus 2 NEGATIVE NEGATIVE Final    Comment: (NOTE) SARS-CoV-2 target nucleic acids are NOT DETECTED. The SARS-CoV-2 RNA is generally detectable in upper and lower respiratory specimens during the acute phase of infection. Negative results do not preclude SARS-CoV-2 infection, do not rule out co-infections  with other pathogens, and should not be used as the sole basis for treatment or other patient management decisions. Negative results must be combined with clinical observations, patient history, and epidemiological information. The expected result is Negative. Fact Sheet for Patients: SugarRoll.be Fact Sheet for Healthcare Providers: https://www.woods-mathews.com/ This test is not yet approved or cleared by the [place] FDA and  has been authorized for detection and/or diagnosis of SARS-CoV-2 by FDA under an Emergency Use Authorization (EUA). This EUA will remain  in effect (meaning this test can be used) for the duration of the COVID-19 declaration under Section 56 4(b)(1) of the Act, 21 U.S.C. section 360bbb-3(b)(1), unless the authorization is terminated or revoked sooner. Performed at [facility], [place] [address] [place], [place] [address]   Body fluid culture     Status: None (Preliminary result)   Collection Time: 12/28/18 12:14 PM   Specimen: KNEE; Body Fluid  Result Value Ref Range Status   Specimen Description   Final    KNEE Performed at [facility]  [address] [place], [place] [address]    Special Requests   Final    Normal Performed at [facility], [place] [address], [place], [place] [address]    Gram Stain   Final    ABUNDANT WBC PRESENT, PREDOMINANTLY PMN MODERATE GRAM POSITIVE COCCI IN PAIRS IN CLUSTERS Performed at [facility], [place] [address] [place], [place] [address]    Culture PENDING  Incomplete   Report Status PENDING  Incomplete         Radiology Studies: Dg Knee Complete 4 Views Right  Result Date: 12/28/2018 CLINICAL DATA:  Knee pain and swelling, no known injury, initial encounter EXAM: RIGHT KNEE - COMPLETE 4+ VIEW COMPARISON:  None. FINDINGS: Tricompartmental degenerative changes are noted. No joint effusion is seen. Soft tissue swelling is  noted over the patella. No acute fracture is seen. Meniscal calcifications are noted. IMPRESSION: Chronic changes without acute abnormality. Overlying soft tissue swelling is seen. Electronically Signed   By: [name] M.D.   On: 12/28/2018 01:55        Scheduled Meds: . enoxaparin (LOVENOX) injection  40 mg Subcutaneous Daily  . [START ON 12/29/2018] influenza vac split quadrivalent PF  0.5 mL Intramuscular Tomorrow-1000  . multivitamin with minerals  1 tablet Oral Daily   Continuous Infusions: .  ceFAZolin (ANCEF) IV 1 g (12/28/18 1442)     LOS: 0 days    Time spent: 40 min    Nicolette Bang, MD [facility]  If 7PM-7AM, please contact night-coverage  12/28/2018, 2:50 PM

## 2018-12-28 NOTE — Progress Notes (Signed)
Pharmacy Antibiotic Note  [name] is a 63 y.o. male admitted on 12/28/2018 with infection of the R knee prepatellar bursa.  Pharmacy has been consulted for vancomycin dosing.  Plan:  Vancomycin 1000 mg IV now, then 1750 mg IV q24 hr (est AUC 476 based on SCr 1.18 and Vd 0.72 - borderline BMI)  Measure vancomycin AUC at steady state as indicated  Ancef per MD  Height: 5' 8.5" (174 cm) Weight: 200 lb 2.8 oz (90.8 kg) IBW/kg (Calculated) : 69.55  Temp (24hrs), Avg:100.5 F (38.1 C), Min:99.2 F (37.3 C), Max:102 F (38.9 C)  Recent Labs  Lab 12/28/18 0101  WBC 15.6*  CREATININE 1.18    Estimated Creatinine Clearance: 69.9 mL/min (by C-G formula based on SCr of 1.18 mg/dL).    No Known Allergies    Thank you for allowing pharmacy to be a part of this patient's care.  Deep Bonawitz A 12/28/2018 3:47 PM

## 2018-12-28 NOTE — Brief Op Note (Signed)
12/28/2018  6:38 PM  PATIENT:  [name]  63 y.o. male  PRE-OPERATIVE DIAGNOSIS:  right knee prepattela bursa infection  POST-OPERATIVE DIAGNOSIS:  right knee infection  PROCEDURE:  Procedure(s): IRRIGATION AND DEBRIDEMENT KNEE (Right)  SURGEON:  Surgeon(s) and Role:    Mcarthur Rossetti, MD - Primary  PHYSICIAN ASSISTANT: Benita Stabile, PA-C  ANESTHESIA:   general  COUNTS:  YES  DICTATION: .Other Dictation: Dictation Number [id number]  PLAN OF CARE: Admit to inpatient   PATIENT DISPOSITION:  PACU - hemodynamically stable.   Delay start of Pharmacological VTE agent (>24hrs) due to surgical blood loss or risk of bleeding: no

## 2018-12-29 ENCOUNTER — Encounter (HOSPITAL_COMMUNITY): Payer: Self-pay | Admitting: Orthopaedic Surgery

## 2018-12-29 DIAGNOSIS — L02419 Cutaneous abscess of limb, unspecified: Secondary | ICD-10-CM | POA: Diagnosis present

## 2018-12-29 LAB — BASIC METABOLIC PANEL
Anion gap: 8 (ref 5–15)
BUN: 16 mg/dL (ref 8–23)
CO2: 21 mmol/L — ABNORMAL LOW (ref 22–32)
Calcium: 8.4 mg/dL — ABNORMAL LOW (ref 8.9–10.3)
Chloride: 102 mmol/L (ref 98–111)
Creatinine, Ser: 1.15 mg/dL (ref 0.61–1.24)
GFR calc Af Amer: >60 mL/min (ref >60)
GFR calc non Af Amer: >60 mL/min (ref >60)
Glucose, Bld: 211 mg/dL — ABNORMAL HIGH (ref 70–99)
Potassium: 4.2 mmol/L (ref 3.5–5.1)
Sodium: 131 mmol/L — ABNORMAL LOW (ref 135–145)

## 2018-12-29 LAB — CBC
HCT: 38.1 % — ABNORMAL LOW (ref 39.0–52.0)
Hemoglobin: 12.5 g/dL — ABNORMAL LOW (ref 13.0–17.0)
MCH: 31.6 pg (ref 26.0–34.0)
MCHC: 32.8 g/dL (ref 30.0–36.0)
MCV: 96.5 fL (ref 80.0–100.0)
Platelets: 195 10*3/uL (ref 150–400)
RBC: 3.95 MIL/uL — ABNORMAL LOW (ref 4.22–5.81)
RDW: 12.3 % (ref 11.5–15.5)
WBC: 18.9 10*3/uL — ABNORMAL HIGH (ref 4.0–10.5)
nRBC: 0 % (ref 0.0–0.2)

## 2018-12-29 MED ORDER — CEFAZOLIN SODIUM-DEXTROSE 2-4 GM/100ML-% IV SOLN
2.0000 g | Freq: Three times a day (TID) | INTRAVENOUS | Status: DC
  Administered 2018-12-29 – 2019-01-04 (×18): 2 g via INTRAVENOUS
  Filled 2018-12-29 (×24): qty 100

## 2018-12-29 MED ORDER — MENTHOL 3 MG MT LOZG
1.0000 | LOZENGE | OROMUCOSAL | Status: DC | PRN
  Administered 2018-12-29: 3 mg via ORAL
  Filled 2018-12-29: qty 9

## 2018-12-29 NOTE — Op Note (Signed)
NAMEBERNADETTE, [name] MEDICAL RECORD [id number] ACCOUNT [id number] DATE OF BIRTH:[birth year] FACILITY: [facility] LOCATION: [facility] PHYSICIAN:Vinnie Bobst [place], MD  OPERATIVE REPORT  DATE OF PROCEDURE:  12/28/2018  PREOPERATIVE DIAGNOSIS:  Right knee prepatellar bursa infection.  POSTOPERATIVE DIAGNOSIS:  Right knee prepatellar bursa infection.  PROCEDURE:   Incision and drainage (irrigation and debridement) right knee prepatellar bursal infection with sharp excisional debridement of necrotic bursa soft tissue only.  FINDINGS:  Gross purulence in the prepatellar bursa area of the right knee.  SURGEON:  Jonn Shingles, MD  ASSISTANT:  Erskine Emery, PA-C.  ANESTHESIA:  General.  ESTIMATED BLOOD LOSS:  Less  than 100 mL.  COMPLICATIONS:  None.  INDICATIONS:  The patient is a 63 year old gentleman we were consulted on earlier today for right knee prepatellar bursa infection with severe cellulitis around his right knee.  This has really worsened over the last 24 hours and he was admitted this  morning to the medical service.  Upon exam, we were able to place an 18-gauge needle in the prepatellar bursa area and drew gross purulence off of this area.  We recommended surgery.  He is now presenting for operative intervention.  We had a long and  thorough discussion about the reasoning behind proceeding with the surgery and he agrees to let us perform this  based on his infection.  There is certainly streaking up his leg as well in terms of lymphedema and certainly infection.  The risks and  benefits were discussed in detail and he does wish to proceed.  DESCRIPTION OF PROCEDURE:  After informed consent was obtained, the appropriate right knee was marked.  He was brought to the operating room and placed supine on the operating table.  General anesthesia was then obtained.  His right thigh, knee, leg and  ankle were prepped and draped with DuraPrep and sterile drapes, including a  sterile stockinette around the foot and ankle.  A time-out was called.  He was identified as correct patient, correct right knee.  We then made a midline incision over the  patella and carried this proximally and distally.  As soon as we entered the bursal tissue, we found abundant gross purulent material.  We suctioned all this out and then explored the soft tissue around the prepatellar area.  This did not seem to  communicate with the native knee joint at all.  Using a #10 blade, we performed a sharp excisional debridement of necrotic bursa fat/fascial tissue only.  This was over a small area.  Our incision though was at least 5 cm in length.    Once we had removed necrotic bursa, we then thoroughly irrigated the prepatellar bursa area using pulsatile lavage and 3 L of normal saline solution.  Once we were done with this, we changed gloves and instrumentation.  I then placed an 18-gauge needle  through the superolateral aspect of his right knee joint to see if there was any worrisome fluid in the joint.  He does have known severe osteoarthritis of that right knee.  I only drew back some synovial fluid consistent with osteoarthritis, with no  evidence of purulence.  It was nice and clear.  We then reapproximated the skin incision with interrupted 2-0 nylon suture.  Xeroform well-padded sterile dressing was applied.  He was awakened, extubated, and taken to the recovery room in stable  condition.  All final counts were correct.  There were no complications noted.  VN/NUANCE  D:12/28/2018 T:12/28/2018 JOB:[id number]

## 2018-12-29 NOTE — Progress Notes (Signed)
PROGRESS NOTE    [name]  [id number] DOB: 23-May-1955 DOA: 12/28/2018 PCP: Patient, No Pcp Per   Brief Narrative:  Per admitting physician: Melynda Keller a 63 y.o.malewith medical history significant ofarthritis, gout (single episode more than 10 years ago). Patient presented to the ED with c/o R knee pain. Onset about 10H PTA when he stomped on something earlier today. Noted swelling to patellar region, pain redness and swelling then quickly spread after that. Now has erythema about the entire R knee streaking up leg.  Patient was subsequently seen by orthopedics and taken to the OR the evening of October 28 for prepatella abscess/I&D and washout  Assessment & Plan:   Principal Problem:   Cellulitis of right knee Active Problems:   Infected prepatellar bursa, right   Prepatellar abscess   Cellulitis right knee: Continue vancomycin Continue Ancef for now-discussed with pharmacy Follow cultures Count increased likely secondary to operative manipulation  check CBC in the morning  Prepatellar abscess t: Was seen by orthopedics Postop day 1 Orthopedics tapped the knee joint fluid was clear no evidence of infection Follow cultures-streptococcal group C  PTA diagnosis of prediabetes: Blood sugar of 101 , 211, Check Hemoglobin A1c    DVT prophylaxis: Lovenox SQ  Code Status: full    Code Status Orders  (From admission, onward)         Start     Ordered   12/28/18 0501  Full code  Continuous     12/28/18 0501        Code Status History    This patient has a current code status but no historical code status.   Advance Care Planning Activity     Family Communication: Discussed with patient in detail Disposition Plan:   Patient remained inpatient for continued IV antibiotics, continued orthopedic subspecialty evaluation, physical therapy to evaluate functionality.  Patient currently not stable for discharge Consults called: None Admission  status: Inpatient   Consultants:   Orthopedics  Procedures:  Dg Knee Complete 4 Views Right  Result Date: 12/28/2018 CLINICAL DATA:  Knee pain and swelling, no known injury, initial encounter EXAM: RIGHT KNEE - COMPLETE 4+ VIEW COMPARISON:  None. FINDINGS: Tricompartmental degenerative changes are noted. No joint effusion is seen. Soft tissue swelling is noted over the patella. No acute fracture is seen. Meniscal calcifications are noted. IMPRESSION: Chronic changes without acute abnormality. Overlying soft tissue swelling is seen. Electronically Signed   By: Inez Catalina M.D.   On: 12/28/2018 01:55     Antimicrobials:   Ancef day 2  Received 1 day of vancomycin   Subjective: Patient reports right lower extremity still painful to move Otherwise no acute events overnight hemodynamically stable  Objective: Vitals:   12/29/18 0100 12/29/18 0436 12/29/18 0741 12/29/18 1000  BP: 137/76 130/70 131/76   Pulse: 68 72 69   Resp:  16 18   Temp: 99 F (37.2 C) 98.1 F (36.7 C) 97.9 F (36.6 C) 98.4 F (36.9 C)  TempSrc: Oral  Oral Oral  SpO2:  100% 100%   Weight:      Height:        Intake/Output Summary (Last 24 hours) at 12/29/2018 1503 Last data filed at 12/29/2018 0906 Gross per 24 hour  Intake 1797.42 ml  Output 1450 ml  Net 347.42 ml   Filed Weights   12/28/18 1529  Weight: 90.8 kg    Examination:  General exam: Appears calm and comfortable  Respiratory system: Clear to auscultation. Respiratory effort normal.  Cardiovascular system: S1 & S2 heard, RRR. No JVD, murmurs, rubs, gallops or clicks. No pedal edema. Gastrointestinal system: Abdomen is nondistended, soft and nontender. No organomegaly or masses felt. Normal bowel sounds heard. Central nervous system: Alert and oriented. No focal neurological deficits. Extremities: Right lower extremity warm well perfused, no bleeding at surgical site, neurovascularly intact, still erythematous consistent with  cellulitis. Skin: Cellulitis as above otherwise no rashes, lesions or ulcers Psychiatry: Judgement and insight appear normal. Mood & affect appropriate.     Data Reviewed: I have personally reviewed following labs and imaging studies  CBC: Recent Labs  Lab 12/28/18 0101 12/29/18 0546  WBC 15.6* 18.9*  NEUTROABS 13.5*  --   HGB 14.5 12.5*  HCT 43.0 38.1*  MCV 94.9 96.5  PLT 244 0000000   Basic Metabolic Panel: Recent Labs  Lab 12/28/18 0101 12/29/18 0546  NA 136 131*  K 4.5 4.2  CL 101 102  CO2 25 21*  GLUCOSE 100* 211*  BUN 15 16  CREATININE 1.18 1.15  CALCIUM 9.2 8.4*   GFR: Estimated Creatinine Clearance: 71.7 mL/min (by C-G formula based on SCr of 1.15 mg/dL). Liver Function Tests: Recent Labs  Lab 12/28/18 0101  AST 44*  ALT 42  ALKPHOS 56  BILITOT 1.8*  PROT 8.2*  ALBUMIN 4.5   No results for input(s): LIPASE, AMYLASE in the last 168 hours. No results for input(s): AMMONIA in the last 168 hours. Coagulation Profile: No results for input(s): INR, PROTIME in the last 168 hours. Cardiac Enzymes: No results for input(s): CKTOTAL, CKMB, CKMBINDEX, TROPONINI in the last 168 hours. BNP (last 3 results) No results for input(s): PROBNP in the last 8760 hours. HbA1C: No results for input(s): HGBA1C in the last 72 hours. CBG: No results for input(s): GLUCAP in the last 168 hours. Lipid Profile: No results for input(s): CHOL, HDL, LDLCALC, TRIG, CHOLHDL, LDLDIRECT in the last 72 hours. Thyroid Function Tests: No results for input(s): TSH, T4TOTAL, FREET4, T3FREE, THYROIDAB in the last 72 hours. Anemia Panel: No results for input(s): VITAMINB12, FOLATE, FERRITIN, TIBC, IRON, RETICCTPCT in the last 72 hours. Sepsis Labs: No results for input(s): PROCALCITON, LATICACIDVEN in the last 168 hours.  Recent Results (from the past 240 hour(s))  SARS CORONAVIRUS 2 (TAT 6-24 HRS) Nasopharyngeal Nasopharyngeal Swab     Status: None   Collection Time: 12/28/18  4:53 AM    Specimen: Nasopharyngeal Swab  Result Value Ref Range Status   SARS Coronavirus 2 NEGATIVE NEGATIVE Final    Comment: (NOTE) SARS-CoV-2 target nucleic acids are NOT DETECTED. The SARS-CoV-2 RNA is generally detectable in upper and lower respiratory specimens during the acute phase of infection. Negative results do not preclude SARS-CoV-2 infection, do not rule out co-infections with other pathogens, and should not be used as the sole basis for treatment or other patient management decisions. Negative results must be combined with clinical observations, patient history, and epidemiological information. The expected result is Negative. Fact Sheet for Patients: SugarRoll.be Fact Sheet for Healthcare Providers: https://www.woods-mathews.com/ This test is not yet approved or cleared by the [place] FDA and  has been authorized for detection and/or diagnosis of SARS-CoV-2 by FDA under an Emergency Use Authorization (EUA). This EUA will remain  in effect (meaning this test can be used) for the duration of the COVID-19 declaration under Section 56 4(b)(1) of the Act, 21 U.S.C. section 360bbb-3(b)(1), unless the authorization is terminated or revoked sooner. Performed at [facility], [place] [address] [place], [place]  [address]   Body fluid culture     Status: None (Preliminary result)   Collection Time: 12/28/18 12:14 PM   Specimen: KNEE; Body Fluid  Result Value Ref Range Status   Specimen Description   Final    KNEE Performed at [facility] [address], [place], [place] [address]    Special Requests   Final    Normal Performed at [facility], [place] [address], [place], [place] [address]    Gram Stain   Final    ABUNDANT WBC PRESENT, PREDOMINANTLY PMN MODERATE GRAM POSITIVE COCCI IN PAIRS IN CLUSTERS Performed at [facility], [place] [address] [place], [place] [address]    Culture  ABUNDANT STREPTOCOCCUS GROUP C  Final   Report Status PENDING  Incomplete         Radiology Studies: Dg Knee Complete 4 Views Right  Result Date: 12/28/2018 CLINICAL DATA:  Knee pain and swelling, no known injury, initial encounter EXAM: RIGHT KNEE - COMPLETE 4+ VIEW COMPARISON:  None. FINDINGS: Tricompartmental degenerative changes are noted. No joint effusion is seen. Soft tissue swelling is noted over the patella. No acute fracture is seen. Meniscal calcifications are noted. IMPRESSION: Chronic changes without acute abnormality. Overlying soft tissue swelling is seen. Electronically Signed   By: Inez Catalina M.D.   On: 12/28/2018 01:55        Scheduled Meds: . enoxaparin (LOVENOX) injection  40 mg Subcutaneous Daily  . influenza vac split quadrivalent PF  0.5 mL Intramuscular Tomorrow-1000  . multivitamin with minerals  1 tablet Oral Daily   Continuous Infusions: .  ceFAZolin (ANCEF) IV 2 g (12/29/18 1453)     LOS: 0 days    Time spent: [place], MD [facility]  If 7PM-7AM, please contact night-coverage  12/29/2018, 3:03 PM

## 2018-12-29 NOTE — Progress Notes (Signed)
Subjective: 1 Day Post-Op Procedure(s) (LRB): IRRIGATION AND DEBRIDEMENT KNEE (Right) Patient reports pain as moderate.  States no real change in overall pain right knee.   Objective: Vital signs in last 24 hours: Temp:  [97.9 F (36.6 C)-103.2 F (39.6 C)] 97.9 F (36.6 C) (10/29 0741) Pulse Rate:  [63-98] 69 (10/29 0741) Resp:  [10-19] 18 (10/29 0741) BP: (130-151)/(68-89) 131/76 (10/29 0741) SpO2:  [92 %-100 %] 100 % (10/29 0741) Weight:  [90.8 kg] 90.8 kg (10/28 1529)  Intake/Output from previous day: 10/28 0701 - 10/29 0700 In: 1917.4 [P.O.:480; I.V.:600; IV Piggyback:837.4] Out: 1250 [Urine:1200; Blood:50] Intake/Output this shift: Total I/O In: -  Out: 600 [Urine:600]  Recent Labs    12/28/18 0101 12/29/18 0546  HGB 14.5 12.5*   Recent Labs    12/28/18 0101 12/29/18 0546  WBC 15.6* 18.9*  RBC 4.53 3.95*  HCT 43.0 38.1*  PLT 244 195   Recent Labs    12/28/18 0101 12/29/18 0546  NA 136 131*  K 4.5 4.2  CL 101 102  CO2 25 21*  BUN 15 16  CREATININE 1.18 1.15  GLUCOSE 100* 211*  CALCIUM 9.2 8.4*   No results for input(s): LABPT, INR in the last 72 hours.  Right lower extremity: Sensation intact distally Dorsiflexion/Plantar flexion intact Incision: dressing C/D/I Compartment soft Knee with decreased erythema proximal extent of area marked for demarcation.   Assessment/Plan: 1 Day Post-Op Procedure(s) (LRB): IRRIGATION AND DEBRIDEMENT KNEE (Right) Increased WBC from 15.6 to 18.9, will repeat CBC in AM. Recommend continued IV antibiotics for at least another 24 hours.  Right knee wound :Gram positive cocci in pairs and clusters ,  sensitivity pending.       [name] 12/29/2018, 9:21 AM

## 2018-12-30 DIAGNOSIS — R7303 Prediabetes: Secondary | ICD-10-CM

## 2018-12-30 LAB — CBC
HCT: 37.9 % — ABNORMAL LOW (ref 39.0–52.0)
Hemoglobin: 12.4 g/dL — ABNORMAL LOW (ref 13.0–17.0)
MCH: 31.5 pg (ref 26.0–34.0)
MCHC: 32.7 g/dL (ref 30.0–36.0)
MCV: 96.2 fL (ref 80.0–100.0)
Platelets: 206 10*3/uL (ref 150–400)
RBC: 3.94 MIL/uL — ABNORMAL LOW (ref 4.22–5.81)
RDW: 12 % (ref 11.5–15.5)
WBC: 15.7 10*3/uL — ABNORMAL HIGH (ref 4.0–10.5)
nRBC: 0 % (ref 0.0–0.2)

## 2018-12-30 LAB — BASIC METABOLIC PANEL
Anion gap: 8 (ref 5–15)
BUN: 17 mg/dL (ref 8–23)
CO2: 23 mmol/L (ref 22–32)
Calcium: 8.4 mg/dL — ABNORMAL LOW (ref 8.9–10.3)
Chloride: 103 mmol/L (ref 98–111)
Creatinine, Ser: 1.12 mg/dL (ref 0.61–1.24)
GFR calc Af Amer: >60 mL/min (ref >60)
GFR calc non Af Amer: >60 mL/min (ref >60)
Glucose, Bld: 125 mg/dL — ABNORMAL HIGH (ref 70–99)
Potassium: 4.1 mmol/L (ref 3.5–5.1)
Sodium: 134 mmol/L — ABNORMAL LOW (ref 135–145)

## 2018-12-30 LAB — BODY FLUID CULTURE: Special Requests: NORMAL

## 2018-12-30 LAB — HEMOGLOBIN A1C
Hgb A1c MFr Bld: 5.7 % — ABNORMAL HIGH (ref 4.8–5.6)
Mean Plasma Glucose: 116.89 mg/dL

## 2018-12-30 MED ORDER — SODIUM CHLORIDE 0.9 % IV SOLN
INTRAVENOUS | Status: DC | PRN
  Administered 2018-12-30 – 2019-01-02 (×3): 250 mL via INTRAVENOUS
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
## 2018-12-30 NOTE — Progress Notes (Signed)
Patient ID: [name], male   DOB: [birth year], 63 y.o.   MRN: [id number] The patient still reports significant right knee pain and some lateral thigh pain.  I was able to change his dressings at the bedside and his erythema has improved significantly.  There is still swelling at the operative site but no gross purulence and no drainage.  Overall clinically looks better.  I was able to place just a Aquacel dressing over the incision.  His calf is soft and his foot is well-perfused.  I was able to bend his knee just slightly.  His white blood cell count is still high at 15,000.  We will continue IV antibiotics.  His final culture results are still pending.

## 2018-12-30 NOTE — Progress Notes (Signed)
PROGRESS NOTE    [name]  [id number] DOB: October 09, 1955 DOA: 12/28/2018 PCP: Patient, No Pcp Per   Brief Narrative:  Per admitting physician: Melynda Keller a 63 y.o.malewith medical history significant ofarthritis, gout (single episode more than 10 years ago). Patient presented to the ED with c/o R knee pain. Onset about 10H PTA when he stomped on something earlier today. Noted swelling to patellar region, pain redness and swelling then quickly spread after that. Now has erythema about the entire R knee streaking up leg.  Patient was subsequently seen by orthopedics and taken to the OR the evening of October 28 for prepatella abscess/I&D and washout  Assessment & Plan:   Principal Problem:   Cellulitis of right knee Active Problems:   Pre-diabetes   Infected prepatellar bursa, right   Prepatellar abscess   Cellulitis right knee: Continue Ancef for now-discussed with pharmacy Intraoperative cultures positive for group C Streptococcus.  Discussed with infectious disease with eventual transition to Augmentin for 3 weeks on discharge.  Currently he has continued erythema, swelling, warmth and tenderness.  Will continue IV antibiotics for now. WBC count remains elevated. check CBC in the morning  Prepatellar infectious bursitis abscess : Was seen by orthopedics Orthopedics tapped the knee joint fluid was clear no evidence of infection Follow cultures-streptococcal group C  PTA diagnosis of prediabetes: Blood sugar of 101 , 211, Check Hemoglobin A1c    DVT prophylaxis: Lovenox SQ  Code Status: full    Code Status Orders  (From admission, onward)         Start     Ordered   12/28/18 0501  Full code  Continuous     12/28/18 0501        Code Status History    This patient has a current code status but no historical code status.   Advance Care Planning Activity     Family Communication: Discussed with patient in detail Disposition Plan:   Patient  remained inpatient for continued IV antibiotics, continued orthopedic subspecialty evaluation, physical therapy to evaluate functionality.  Patient currently not stable for discharge Consults called: None Admission status: Inpatient   Consultants:   Orthopedics  Procedures:  Dg Knee Complete 4 Views Right  Result Date: 12/28/2018 CLINICAL DATA:  Knee pain and swelling, no known injury, initial encounter EXAM: RIGHT KNEE - COMPLETE 4+ VIEW COMPARISON:  None. FINDINGS: Tricompartmental degenerative changes are noted. No joint effusion is seen. Soft tissue swelling is noted over the patella. No acute fracture is seen. Meniscal calcifications are noted. IMPRESSION: Chronic changes without acute abnormality. Overlying soft tissue swelling is seen. Electronically Signed   By: Inez Catalina M.D.   On: 12/28/2018 01:55    Antimicrobials:   Ancef day 3  Received 1 day of vancomycin   Subjective: Continues to have pain in his right leg.  Unable to bend his knee.  Objective: Vitals:   12/30/18 0521 12/30/18 0537 12/30/18 1421 12/30/18 2100  BP: 115/82 131/69 (!) 143/77 (!) 152/68  Pulse: 71 65 71 75  Resp: 16 18 20 19   Temp: 98.8 F (37.1 C) 99 F (37.2 C) 99.3 F (37.4 C) (!) 100.8 F (38.2 C)  TempSrc: Oral  Oral Oral  SpO2: 99% 96% 97% 96%  Weight:      Height:        Intake/Output Summary (Last 24 hours) at 12/30/2018 2145 Last data filed at 12/30/2018 1506 Gross per 24 hour  Intake -  Output 825 ml  Net -825 ml  Filed Weights   12/28/18 1529  Weight: 90.8 kg    Examination:  General exam: Alert, awake, oriented x 3 Respiratory system: Clear to auscultation. Respiratory effort normal. Cardiovascular system:RRR. No murmurs, rubs, gallops. Gastrointestinal system: Abdomen is nondistended, soft and nontender. No organomegaly or masses felt. Normal bowel sounds heard. Central nervous system: Alert and oriented. No focal neurological deficits. Extremities: Right knee  is wrapped in Ace bandage. Skin: Right leg is warm to touch, persistent erythema and swelling. Psychiatry: Judgement and insight appear normal. Mood & affect appropriate.      Data Reviewed: I have personally reviewed following labs and imaging studies  CBC: Recent Labs  Lab 12/28/18 0101 12/29/18 0546 12/30/18 0521  WBC 15.6* 18.9* 15.7*  NEUTROABS 13.5*  --   --   HGB 14.5 12.5* 12.4*  HCT 43.0 38.1* 37.9*  MCV 94.9 96.5 96.2  PLT 244 195 [id number]   Basic Metabolic Panel: Recent Labs  Lab 12/28/18 0101 12/29/18 0546 12/30/18 0521  NA 136 131* 134*  K 4.5 4.2 4.1  CL 101 102 103  CO2 25 21* 23  GLUCOSE 100* 211* 125*  BUN 15 16 17   CREATININE 1.18 1.15 1.12  CALCIUM 9.2 8.4* 8.4*   GFR: Estimated Creatinine Clearance: 73.6 mL/min (by C-G formula based on SCr of 1.12 mg/dL). Liver Function Tests: Recent Labs  Lab 12/28/18 0101  AST 44*  ALT 42  ALKPHOS 56  BILITOT 1.8*  PROT 8.2*  ALBUMIN 4.5   No results for input(s): LIPASE, AMYLASE in the last 168 hours. No results for input(s): AMMONIA in the last 168 hours. Coagulation Profile: No results for input(s): INR, PROTIME in the last 168 hours. Cardiac Enzymes: No results for input(s): CKTOTAL, CKMB, CKMBINDEX, TROPONINI in the last 168 hours. BNP (last 3 results) No results for input(s): PROBNP in the last 8760 hours. HbA1C: Recent Labs    12/30/18 0521  HGBA1C 5.7*   CBG: No results for input(s): GLUCAP in the last 168 hours. Lipid Profile: No results for input(s): CHOL, HDL, LDLCALC, TRIG, CHOLHDL, LDLDIRECT in the last 72 hours. Thyroid Function Tests: No results for input(s): TSH, T4TOTAL, FREET4, T3FREE, THYROIDAB in the last 72 hours. Anemia Panel: No results for input(s): VITAMINB12, FOLATE, FERRITIN, TIBC, IRON, RETICCTPCT in the last 72 hours. Sepsis Labs: No results for input(s): PROCALCITON, LATICACIDVEN in the last 168 hours.  Recent Results (from the past 240 hour(s))  SARS  CORONAVIRUS 2 (TAT 6-24 HRS) Nasopharyngeal Nasopharyngeal Swab     Status: None   Collection Time: 12/28/18  4:53 AM   Specimen: Nasopharyngeal Swab  Result Value Ref Range Status   SARS Coronavirus 2 NEGATIVE NEGATIVE Final    Comment: (NOTE) SARS-CoV-2 target nucleic acids are NOT DETECTED. The SARS-CoV-2 RNA is generally detectable in upper and lower respiratory specimens during the acute phase of infection. Negative results do not preclude SARS-CoV-2 infection, do not rule out co-infections with other pathogens, and should not be used as the sole basis for treatment or other patient management decisions. Negative results must be combined with clinical observations, patient history, and epidemiological information. The expected result is Negative. Fact Sheet for Patients: SugarRoll.be Fact Sheet for Healthcare Providers: https://www.woods-mathews.com/ This test is not yet approved or cleared by the [place] FDA and  has been authorized for detection and/or diagnosis of SARS-CoV-2 by FDA under an Emergency Use Authorization (EUA). This EUA will remain  in effect (meaning this test can be used) for the duration of the COVID-19  declaration under Section 56 4(b)(1) of the Act, 21 U.S.C. section 360bbb-3(b)(1), unless the authorization is terminated or revoked sooner. Performed at [facility], [place] [address], [place], [place] [address]   Body fluid culture     Status: None   Collection Time: 12/28/18 12:14 PM   Specimen: KNEE; Body Fluid  Result Value Ref Range Status   Specimen Description   Final    KNEE Performed at [place] [address], [place], [place] [address]    Special Requests   Final    Normal Performed at [facility], [place] [address], [place], [place] [address]    Gram Stain   Final    ABUNDANT WBC PRESENT, PREDOMINANTLY PMN MODERATE GRAM POSITIVE COCCI IN PAIRS IN  CLUSTERS Performed at [facility], [place] [address], [place], [place] [address]    Culture ABUNDANT STREPTOCOCCUS GROUP C  Final   Report Status 12/30/2018 FINAL  Final         Radiology Studies: No results found.      Scheduled Meds: . enoxaparin (LOVENOX) injection  40 mg Subcutaneous Daily  . influenza vac split quadrivalent PF  0.5 mL Intramuscular Tomorrow-1000  . multivitamin with minerals  1 tablet Oral Daily   Continuous Infusions: . sodium chloride 250 mL (12/30/18 2109)  .  ceFAZolin (ANCEF) IV 2 g (12/30/18 2112)     LOS: 1 day    Time spent: [place], MD [facility]  If 7PM-7AM, please contact night-coverage  12/30/2018, 9:45 PM

## 2018-12-30 NOTE — Anesthesia Postprocedure Evaluation (Signed)
Anesthesia Post Note  Patient: [name]  Procedure(s) Performed: IRRIGATION AND DEBRIDEMENT KNEE (Right Knee)     Patient location during evaluation: PACU Anesthesia Type: General Level of consciousness: awake and alert Pain management: pain level controlled Vital Signs Assessment: post-procedure vital signs reviewed and stable Respiratory status: spontaneous breathing, nonlabored ventilation, respiratory function stable and patient connected to nasal cannula oxygen Cardiovascular status: blood pressure returned to baseline and stable Postop Assessment: no apparent nausea or vomiting Anesthetic complications: no    Last Vitals:  Vitals:   12/30/18 0521 12/30/18 0537  BP: 115/82 131/69  Pulse: 71 65  Resp: 16 18  Temp: 37.1 C 37.2 C  SpO2: 99% 96%    Last Pain:  Vitals:   12/30/18 0639  TempSrc:   PainSc: [place]

## 2018-12-31 ENCOUNTER — Inpatient Hospital Stay (HOSPITAL_COMMUNITY): Payer: Self-pay

## 2018-12-31 LAB — URINALYSIS, ROUTINE W REFLEX MICROSCOPIC
Bilirubin Urine: NEGATIVE
Glucose, UA: NEGATIVE mg/dL
Hgb urine dipstick: NEGATIVE
Ketones, ur: NEGATIVE mg/dL
Leukocytes,Ua: NEGATIVE
Nitrite: NEGATIVE
Protein, ur: NEGATIVE mg/dL
Specific Gravity, Urine: 1.027 (ref 1.005–1.030)
pH: 5 (ref 5.0–8.0)

## 2018-12-31 LAB — CBC
HCT: 36 % — ABNORMAL LOW (ref 39.0–52.0)
Hemoglobin: 11.9 g/dL — ABNORMAL LOW (ref 13.0–17.0)
MCH: 31.6 pg (ref 26.0–34.0)
MCHC: 33.1 g/dL (ref 30.0–36.0)
MCV: 95.5 fL (ref 80.0–100.0)
Platelets: 265 10*3/uL (ref 150–400)
RBC: 3.77 MIL/uL — ABNORMAL LOW (ref 4.22–5.81)
RDW: 11.9 % (ref 11.5–15.5)
WBC: 11.1 10*3/uL — ABNORMAL HIGH (ref 4.0–10.5)
nRBC: 0 % (ref 0.0–0.2)

## 2018-12-31 LAB — CREATININE, SERUM
Creatinine, Ser: 1.08 mg/dL (ref 0.61–1.24)
GFR calc Af Amer: >60 mL/min (ref >60)
GFR calc non Af Amer: >60 mL/min (ref >60)

## 2018-12-31 LAB — URIC ACID: Uric Acid, Serum: 5.6 mg/dL (ref 3.7–8.6)

## 2018-12-31 IMAGING — DX DG CHEST 1V PORT
1 series · 1 of 1 positions shown · non-contrast
Comparison: Portable exam [6J] hours compared to [DATE]

CLINICAL DATA: Fever

EXAM:
PORTABLE CHEST 1 VIEW

[chest ap]
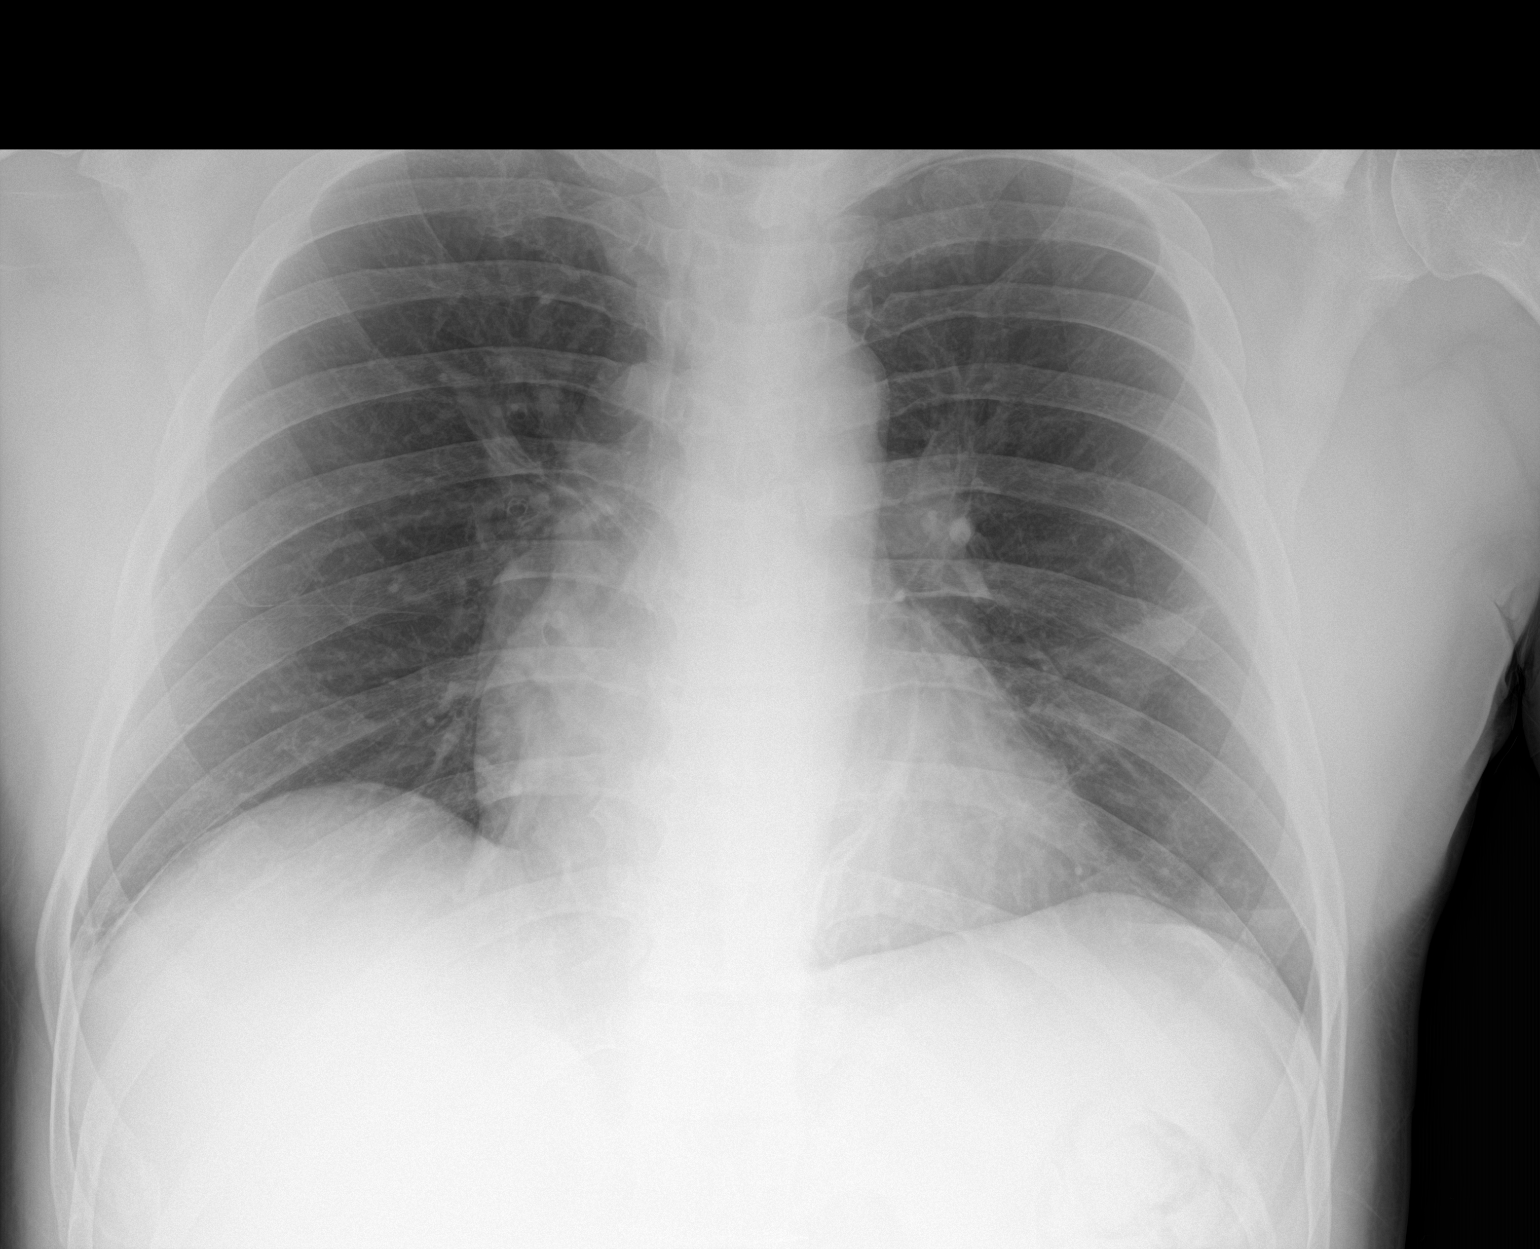

[1 of 1 positions shown; findings below may reference images not displayed]

FINDINGS: Normal heart size, mediastinal contours, and pulmonary vascularity.

Opacities at the LEFT base and in RIGHT upper lobe, somewhat linear,
could represent atelectasis or infiltrate.

Remaining lungs clear.

No pleural effusion or pneumothorax.

Osseous structures unremarkable.
IMPRESSION: Somewhat linear opacities in the RIGHT upper lobe and at LEFT lung
base, question atelectasis or infiltrate.

## 2018-12-31 MED ORDER — COLCHICINE 0.6 MG PO TABS
0.6000 mg | ORAL_TABLET | Freq: Two times a day (BID) | ORAL | Status: DC
  Administered 2018-12-31: 0.6 mg via ORAL
  Filled 2018-12-31 (×2): qty 1

## 2018-12-31 MED ORDER — VANCOMYCIN HCL 10 G IV SOLR
2000.0000 mg | Freq: Once | INTRAVENOUS | Status: AC
  Administered 2018-12-31: 2000 mg via INTRAVENOUS
  Filled 2018-12-31: qty 2000

## 2018-12-31 MED ORDER — VANCOMYCIN HCL IN DEXTROSE 1-5 GM/200ML-% IV SOLN
1000.0000 mg | Freq: Two times a day (BID) | INTRAVENOUS | Status: DC
  Administered 2019-01-01 – 2019-01-04 (×6): 1000 mg via INTRAVENOUS
  Filled 2018-12-31 (×5): qty 200

## 2018-12-31 MED ORDER — KETOROLAC TROMETHAMINE 30 MG/ML IJ SOLN
30.0000 mg | Freq: Once | INTRAMUSCULAR | Status: AC
  Administered 2018-12-31: 13:00:00 30 mg via INTRAVENOUS
  Filled 2018-12-31: qty 1

## 2018-12-31 NOTE — Progress Notes (Signed)
Xcover 101.9 per RN  A/P Fever Blood culture x2 Urinalysis CXR Add Vanco iv pharmacy to dose

## 2018-12-31 NOTE — Progress Notes (Signed)
Pharmacy Antibiotic Note  [name] is a 63 y.o. male admitted on 12/28/2018 with cellulitis.  Pharmacy has been consulted for vancomycin dosing.  Plan: Vancomcyin 2 Gm x1 then 1 Gm IV q12h for est AUC = 503 Continue Ancef 2 Gm IV q8h Goal AUC = 400-550 F/u scr/cultures  Height: 5' 8.5" (174 cm) Weight: 200 lb 2.8 oz (90.8 kg) IBW/kg (Calculated) : 69.55  Temp (24hrs), Avg:99.9 F (37.7 C), Min:98.6 F (37 C), Max:101.9 F (38.8 C)  Recent Labs  Lab 12/28/18 0101 12/29/18 0546 12/30/18 0521 12/31/18 0539  WBC 15.6* 18.9* 15.7* 11.1*  CREATININE 1.18 1.15 1.12 1.08    Estimated Creatinine Clearance: 76.3 mL/min (by C-G formula based on SCr of 1.08 mg/dL).    No Known Allergies  Antimicrobials this admission: 10/29 ancef >>  10/28 vanc >> 10/29 >>10/31 Vanc  Dose adjustments this admission:   Microbiology results:  BCx:   UCx:    Sputum:    MRSA PCR:  Thank you for allowing pharmacy to be a part of this patient's care.  Dorrene German 12/31/2018 9:49 PM

## 2018-12-31 NOTE — Progress Notes (Signed)
PROGRESS NOTE    [name]  [id number] DOB: 15-Dec-1955 DOA: 12/28/2018 PCP: Patient, No Pcp Per   Brief Narrative:  Per admitting physician: Melynda Keller a 63 y.o.malewith medical history significant ofarthritis, gout (single episode more than 10 years ago). Patient presented to the ED with c/o R knee pain. Onset about 10H PTA when he stomped on something earlier today. Noted swelling to patellar region, pain redness and swelling then quickly spread after that. Now has erythema about the entire R knee streaking up leg.  Patient was subsequently seen by orthopedics and taken to the OR the evening of October 28 for prepatella abscess/I&D and washout  Assessment & Plan:   Principal Problem:   Cellulitis of right knee Active Problems:   Pre-diabetes   Infected prepatellar bursa, right   Prepatellar abscess   Cellulitis right knee: Continue Ancef for now-discussed with pharmacy Intraoperative cultures positive for group C Streptococcus.  Discussed with infectious disease with eventual transition to Augmentin for 3 weeks on discharge.  Currently he has continued erythema, swelling, warmth and tenderness.  Will continue IV antibiotics for now.  He is having low-grade fevers and reports worsening pain.  Case discussed with orthopedics, Dr Erlinda Hong who recommended MRI of his femur to evaluate thigh and knee for developing osteomyelitis/abscess.  Check venous Dopplers to rule out DVT.  Hold off on therapeutic anticoagulation for now until MRI results available in case he needs any further surgery.Patient felt that pain in his right toe and right ankle was similar to previous gout attack.  Clinically, his toe and ankle did not appear to be inflamed and uric acid are normal range. WBC count is trending down. check CBC in the morning  Prepatellar infectious bursitis abscess : Was seen by orthopedics Orthopedics tapped the knee joint fluid was clear no evidence of infection Follow  cultures-streptococcal group C  PTA diagnosis of prediabetes: Blood sugar of 101 , 211, Check Hemoglobin A1c    DVT prophylaxis: Lovenox SQ  Code Status: full    Code Status Orders  (From admission, onward)         Start     Ordered   12/28/18 0501  Full code  Continuous     12/28/18 0501        Code Status History    This patient has a current code status but no historical code status.   Advance Care Planning Activity     Family Communication: Discussed with patient in detail Disposition Plan:   Patient remained inpatient for continued IV antibiotics, continued orthopedic subspecialty evaluation, physical therapy to evaluate functionality.  Patient currently not stable for discharge Consults called: None Admission status: Inpatient   Consultants:   Orthopedics  Procedures:  Dg Knee Complete 4 Views Right  Result Date: 12/28/2018 CLINICAL DATA:  Knee pain and swelling, no known injury, initial encounter EXAM: RIGHT KNEE - COMPLETE 4+ VIEW COMPARISON:  None. FINDINGS: Tricompartmental degenerative changes are noted. No joint effusion is seen. Soft tissue swelling is noted over the patella. No acute fracture is seen. Meniscal calcifications are noted. IMPRESSION: Chronic changes without acute abnormality. Overlying soft tissue swelling is seen. Electronically Signed   By: Inez Catalina M.D.   On: 12/28/2018 01:55    Antimicrobials:   Ancef day 4  Received 1 day of vancomycin   Subjective: Patient reports continued pain in his right knee.  He has been having low-grade fevers.  Has worsening pain in his right lateral thigh.  Also complains of pain  in his right great toe and right ankle.  Objective: Vitals:   12/30/18 2240 12/31/18 0500 12/31/18 0903 12/31/18 1400  BP:  121/65 136/78 (!) 141/74  Pulse:  68 73 75  Resp:  19 20 18   Temp: 99.7 F (37.6 C) 98.6 F (37 C) 99 F (37.2 C) 100.1 F (37.8 C)  TempSrc:  Oral Oral Oral  SpO2:  98% 98% 98%  Weight:       Height:        Intake/Output Summary (Last 24 hours) at 12/31/2018 1724 Last data filed at 12/31/2018 0900 Gross per 24 hour  Intake 1017.07 ml  Output 1060 ml  Net -42.93 ml   Filed Weights   12/28/18 1529  Weight: 90.8 kg    Examination:  General exam: Alert, awake, oriented x 3 Respiratory system: Clear to auscultation. Respiratory effort normal. Cardiovascular system:RRR. No murmurs, rubs, gallops. Gastrointestinal system: Abdomen is nondistended, soft and nontender. No organomegaly or masses felt. Normal bowel sounds heard. Central nervous system: Alert and oriented. No focal neurological deficits. Extremities: R knee with dressing over it.  Right thigh is edematous, warm with erythema, particularly in the lateral aspect.  Right great toe is tender to palpation, but does not appear edematous, erythematous or warm.  Right ankle is also tender to palpation, does not appear to be edematous or warm. Skin: No rashes, lesions or ulcers Psychiatry: Judgement and insight appear normal. Mood & affect appropriate.       Data Reviewed: I have personally reviewed following labs and imaging studies  CBC: Recent Labs  Lab 12/28/18 0101 12/29/18 0546 12/30/18 0521 12/31/18 0539  WBC 15.6* 18.9* 15.7* 11.1*  NEUTROABS 13.5*  --   --   --   HGB 14.5 12.5* 12.4* 11.9*  HCT 43.0 38.1* 37.9* 36.0*  MCV 94.9 96.5 96.2 95.5  PLT 244 195 206 [id number]   Basic Metabolic Panel: Recent Labs  Lab 12/28/18 0101 12/29/18 0546 12/30/18 0521 12/31/18 0539  NA 136 131* 134*  --   K 4.5 4.2 4.1  --   CL 101 102 103  --   CO2 25 21* 23  --   GLUCOSE 100* 211* 125*  --   BUN 15 16 17   --   CREATININE 1.18 1.15 1.12 1.08  CALCIUM 9.2 8.4* 8.4*  --    GFR: Estimated Creatinine Clearance: 76.3 mL/min (by C-G formula based on SCr of 1.08 mg/dL). Liver Function Tests: Recent Labs  Lab 12/28/18 0101  AST 44*  ALT 42  ALKPHOS 56  BILITOT 1.8*  PROT 8.2*  ALBUMIN 4.5   No results  for input(s): LIPASE, AMYLASE in the last 168 hours. No results for input(s): AMMONIA in the last 168 hours. Coagulation Profile: No results for input(s): INR, PROTIME in the last 168 hours. Cardiac Enzymes: No results for input(s): CKTOTAL, CKMB, CKMBINDEX, TROPONINI in the last 168 hours. BNP (last 3 results) No results for input(s): PROBNP in the last 8760 hours. HbA1C: Recent Labs    12/30/18 0521  HGBA1C 5.7*   CBG: No results for input(s): GLUCAP in the last 168 hours. Lipid Profile: No results for input(s): CHOL, HDL, LDLCALC, TRIG, CHOLHDL, LDLDIRECT in the last 72 hours. Thyroid Function Tests: No results for input(s): TSH, T4TOTAL, FREET4, T3FREE, THYROIDAB in the last 72 hours. Anemia Panel: No results for input(s): VITAMINB12, FOLATE, FERRITIN, TIBC, IRON, RETICCTPCT in the last 72 hours. Sepsis Labs: No results for input(s): PROCALCITON, LATICACIDVEN in the last 168 hours.  Recent Results (from the past 240 hour(s))  SARS CORONAVIRUS 2 (TAT 6-24 HRS) Nasopharyngeal Nasopharyngeal Swab     Status: None   Collection Time: 12/28/18  4:53 AM   Specimen: Nasopharyngeal Swab  Result Value Ref Range Status   SARS Coronavirus 2 NEGATIVE NEGATIVE Final    Comment: (NOTE) SARS-CoV-2 target nucleic acids are NOT DETECTED. The SARS-CoV-2 RNA is generally detectable in upper and lower respiratory specimens during the acute phase of infection. Negative results do not preclude SARS-CoV-2 infection, do not rule out co-infections with other pathogens, and should not be used as the sole basis for treatment or other patient management decisions. Negative results must be combined with clinical observations, patient history, and epidemiological information. The expected result is Negative. Fact Sheet for Patients: SugarRoll.be Fact Sheet for Healthcare Providers: https://www.woods-mathews.com/ This test is not yet approved or cleared by the  [place] FDA and  has been authorized for detection and/or diagnosis of SARS-CoV-2 by FDA under an Emergency Use Authorization (EUA). This EUA will remain  in effect (meaning this test can be used) for the duration of the COVID-19 declaration under Section 56 4(b)(1) of the Act, 21 U.S.C. section 360bbb-3(b)(1), unless the authorization is terminated or revoked sooner. Performed at [facility], [place] [address] [place], [place] [address]   Body fluid culture     Status: None   Collection Time: 12/28/18 12:14 PM   Specimen: KNEE; Body Fluid  Result Value Ref Range Status   Specimen Description   Final    KNEE Performed at [facility] [address], [place], [place] [address]    Special Requests   Final    Normal Performed at [facility], [place] [address] [place], [place] [address]    Gram Stain   Final    ABUNDANT WBC PRESENT, PREDOMINANTLY PMN MODERATE GRAM POSITIVE COCCI IN PAIRS IN CLUSTERS Performed at [facility], [place] [address], [place], [place] [address]    Culture ABUNDANT STREPTOCOCCUS GROUP C  Final   Report Status 12/30/2018 FINAL  Final         Radiology Studies: No results found.      Scheduled Meds: . enoxaparin (LOVENOX) injection  40 mg Subcutaneous Daily  . influenza vac split quadrivalent PF  0.5 mL Intramuscular Tomorrow-1000  . multivitamin with minerals  1 tablet Oral Daily   Continuous Infusions: . sodium chloride Stopped (12/31/18 0022)  .  ceFAZolin (ANCEF) IV 2 g (12/31/18 1324)     LOS: 2 days    Time spent: 90mins    Kathie Dike, MD [facility]  If 7PM-7AM, please contact night-coverage  12/31/2018, 5:24 PM

## 2018-12-31 NOTE — Progress Notes (Signed)
   12/31/18 2047  Vitals  Temp (!) 101.9 F (38.8 C)  Temp Source Oral  BP (!) 156/70  MAP (mmHg) 92  BP Location Left Arm  BP Method Automatic  Patient Position (if appropriate) Lying  Pulse Rate 80  Pulse Rate Source Dinamap  Resp 19  Oxygen Therapy  SpO2 98 %  O2 Device Room Air  MEWS Score  MEWS RR 0  MEWS Pulse 0  MEWS Systolic 0  MEWS LOC 0  MEWS Temp 2  MEWS Score 2  MEWS Score Color Yellow  MEWS Assessment  Is this an acute change? Yes  MEWS guidelines implemented *See [place]  Provider Notification  Provider Name/Title Dr. Maudie Mercury  Date Provider Notified 12/31/18  Time Provider Notified 2115  Notification Type Page  Notification Reason Other (Comment) (T: 101.9)  Response See new orders  Date of Provider Response 12/31/18  Time of Provider Response 2126   Blood cultures, CXR, UA, Vancomycin ordered

## 2018-12-31 NOTE — Progress Notes (Addendum)
PT Cancellation Note  Patient Details Name: [name] MRN: [id number] DOB: 1955-08-05   Cancelled Treatment:    Reason Eval/Treat Not Completed: Pain limiting ability to participate. RN requested to hold off on PT at this time. Will check back as schedule permits. Addendum: Checked back and RN states pt is still having difficulty with pain control, but he has just started on gout meds and may be able to tolerate once those are in system. Will check back.  Galen Manila 12/31/2018, 11:04 AM

## 2019-01-01 ENCOUNTER — Inpatient Hospital Stay (HOSPITAL_COMMUNITY): Payer: Self-pay

## 2019-01-01 DIAGNOSIS — M7989 Other specified soft tissue disorders: Secondary | ICD-10-CM

## 2019-01-01 LAB — COMPREHENSIVE METABOLIC PANEL
ALT: 50 U/L — ABNORMAL HIGH (ref 0–44)
AST: 36 U/L (ref 15–41)
Albumin: 3.3 g/dL — ABNORMAL LOW (ref 3.5–5.0)
Alkaline Phosphatase: 78 U/L (ref 38–126)
Anion gap: 11 (ref 5–15)
BUN: 15 mg/dL (ref 8–23)
CO2: 22 mmol/L (ref 22–32)
Calcium: 8.8 mg/dL — ABNORMAL LOW (ref 8.9–10.3)
Chloride: 102 mmol/L (ref 98–111)
Creatinine, Ser: 1.02 mg/dL (ref 0.61–1.24)
GFR calc Af Amer: >60 mL/min (ref >60)
GFR calc non Af Amer: >60 mL/min (ref >60)
Glucose, Bld: 153 mg/dL — ABNORMAL HIGH (ref 70–99)
Potassium: 4.3 mmol/L (ref 3.5–5.1)
Sodium: 135 mmol/L (ref 135–145)
Total Bilirubin: 1 mg/dL (ref 0.3–1.2)
Total Protein: 7.4 g/dL (ref 6.5–8.1)

## 2019-01-01 LAB — CBC
HCT: 37.2 % — ABNORMAL LOW (ref 39.0–52.0)
Hemoglobin: 12.4 g/dL — ABNORMAL LOW (ref 13.0–17.0)
MCH: 31.5 pg (ref 26.0–34.0)
MCHC: 33.3 g/dL (ref 30.0–36.0)
MCV: 94.4 fL (ref 80.0–100.0)
Platelets: 287 10*3/uL (ref 150–400)
RBC: 3.94 MIL/uL — ABNORMAL LOW (ref 4.22–5.81)
RDW: 11.5 % (ref 11.5–15.5)
WBC: 13.3 10*3/uL — ABNORMAL HIGH (ref 4.0–10.5)
nRBC: 0 % (ref 0.0–0.2)

## 2019-01-01 MED ORDER — KETOROLAC TROMETHAMINE 15 MG/ML IJ SOLN
15.0000 mg | Freq: Four times a day (QID) | INTRAMUSCULAR | Status: DC | PRN
  Administered 2019-01-01 – 2019-01-03 (×3): 15 mg via INTRAVENOUS
  Filled 2019-01-01 (×4): qty 1

## 2019-01-01 NOTE — Progress Notes (Signed)
Pt c/o pain 10/10 after receiving Dilaudid 1 mg IV @ 2100 and Oxycodone 10 mg @ [id number], MD paged and made aware of patient's c/o Gout pain MD advised to administer Pt ordered Ibuprofen (400 mg PO) as this would better help Gout pain, administered @ 0048.

## 2019-01-01 NOTE — Progress Notes (Signed)
Entered Pt room to round on Pt needs asking how he was feeling Pt stating that he had been just horrible all night and that he didn't understand why his pain could not be controlled at night, made Pt aware that he had been sleeping when he had been rounded on the last 2 times and he stated, "I was only half asleep" then mumbled under his breath he would have his son bring him some Ibuprofen, that this was ridiculous, advised Pt  that he had Ibuprofen at 0048 as the doctor had directed, Pt then stated, "and that other girl hasn't gotten my vital signs again the last time was about midnight, made Pt aware that his VS were taken at 0027 and that they were not due again until apx 0430 and that I could get him some Dilaudid at that time or Tylenol, Pt stated "No, I don't want anything, I'll just wait until 0800 so I can talk to somebody about this." asked Pt again If I could get him anything for pain and he stated, "No my board says pain management and I'll just wait to talk to them."

## 2019-01-01 NOTE — Progress Notes (Signed)
PROGRESS NOTE    [name]  [id number] DOB: April 20, 1955 DOA: 12/28/2018 PCP: Patient, No Pcp Per   Brief Narrative:  Per admitting physician: Melynda Keller a 63 y.o.malewith medical history significant ofarthritis, gout (single episode more than 10 years ago). Patient presented to the ED with c/o R knee pain. Onset about 10H PTA when he stomped on something earlier today. Noted swelling to patellar region, pain redness and swelling then quickly spread after that. Now has erythema about the entire R knee streaking up leg.  Patient was subsequently seen by orthopedics and taken to the OR the evening of October 28 for prepatella abscess/I&D and washout  Assessment & Plan:   Principal Problem:   Cellulitis of right knee Active Problems:   Pre-diabetes   Infected prepatellar bursa, right   Prepatellar abscess   Cellulitis right knee: Continue Ancef for now-discussed with pharmacy Intraoperative cultures positive for group C Streptococcus.  Discussed with infectious disease with eventual transition to Augmentin for 3 weeks on discharge.  Currently he has continued erythema, swelling, warmth and tenderness.  Will continue IV antibiotics for now.  He is having low-grade fevers and reports worsening pain, due to fever IV vancomycin was added.  Case discussed with orthopedics, Dr Erlinda Hong who recommended MRI of his femur to evaluate thigh and knee for developing osteomyelitis/abscess.  Check venous Dopplers to rule out DVT.  Hold off on therapeutic anticoagulation for now until MRI results available in case he needs any further surgery.Patient felt that pain in his right toe and right ankle was similar to previous gout attack.  Clinically, his toe and ankle did not appear to be inflamed and uric acid are normal range. WBC count is trending down. check CBC in the morning Added PRN toradol as that may help with pain  Prepatellar infectious bursitis abscess : Was seen by orthopedics  Orthopedics tapped the knee joint fluid was clear no evidence of infection Follow cultures-streptococcal group C  PTA diagnosis of prediabetes: Blood sugar of 101 , 211, Check Hemoglobin A1c    DVT prophylaxis: Lovenox SQ  Code Status: full    Code Status Orders  (From admission, onward)         Start     Ordered   12/28/18 0501  Full code  Continuous     12/28/18 0501        Code Status History    This patient has a current code status but no historical code status.   Advance Care Planning Activity     Family Communication: Discussed with patient in detail Disposition Plan:   Patient remained inpatient for continued IV antibiotics, continued orthopedic subspecialty evaluation, physical therapy to evaluate functionality.  Patient currently not stable for discharge Consults called: None Admission status: Inpatient   Consultants:   Orthopedics  Procedures:  Dg Chest Port 1 View  Result Date: 12/31/2018 CLINICAL DATA:  Fever EXAM: PORTABLE CHEST 1 VIEW COMPARISON:  Portable exam 2132 hours compared to 01/23/2007 FINDINGS: Normal heart size, mediastinal contours, and pulmonary vascularity. Opacities at the LEFT base and in RIGHT upper lobe, somewhat linear, could represent atelectasis or infiltrate. Remaining lungs clear. No pleural effusion or pneumothorax. Osseous structures unremarkable. IMPRESSION: Somewhat linear opacities in the RIGHT upper lobe and at LEFT lung base, question atelectasis or infiltrate. Electronically Signed   By: Lavonia Dana M.D.   On: 12/31/2018 21:57   Dg Knee Complete 4 Views Right  Result Date: 12/28/2018 CLINICAL DATA:  Knee pain and swelling, no known  injury, initial encounter EXAM: RIGHT KNEE - COMPLETE 4+ VIEW COMPARISON:  None. FINDINGS: Tricompartmental degenerative changes are noted. No joint effusion is seen. Soft tissue swelling is noted over the patella. No acute fracture is seen. Meniscal calcifications are noted. IMPRESSION: Chronic  changes without acute abnormality. Overlying soft tissue swelling is seen. Electronically Signed   By: Inez Catalina M.D.   On: 12/28/2018 01:55    Antimicrobials:   Ancef day 4  Received 1 day of vancomycin   Subjective: Patient reports continued pain in his right knee.  He has been having low-grade fevers.  Has worsening pain in his right lateral thigh.  Also complains of pain in his right great toe and right ankle, feels like his previous gout flare in left foot years ago. MRI ordered but not done yet.  Objective: Vitals:   12/31/18 2249 01/01/19 0027 01/01/19 0452 01/01/19 0814  BP: (!) 146/78 (!) 155/72 131/71 (!) 158/85  Pulse: 74 76 64 71  Resp: 18 18 17 20   Temp: 99.2 F (37.3 C) 99.1 F (37.3 C) 98.9 F (37.2 C) 98.7 F (37.1 C)  TempSrc: Oral Oral Oral Oral  SpO2: 97% 99% 98% 100%  Weight:      Height:        Intake/Output Summary (Last 24 hours) at 01/01/2019 1002 Last data filed at 01/01/2019 0817 Gross per 24 hour  Intake 976.72 ml  Output 1200 ml  Net -223.28 ml   Filed Weights   12/28/18 1529  Weight: 90.8 kg    Examination:  General exam: Alert, awake, oriented x 3 Respiratory system: Clear to auscultation. Respiratory effort normal. Cardiovascular system:RRR. No murmurs, rubs, gallops. Gastrointestinal system: Abdomen is nondistended, soft and nontender. No organomegaly or masses felt. Normal bowel sounds heard. Central nervous system: Alert and oriented. No focal neurological deficits. Extremities: R knee with dressing over it.  Right thigh is edematous, warm with erythema, particularly in the lateral aspect.  Right great toe is tender to palpation, but does not appear edematous, erythematous or warm.  Right ankle is also tender to palpation, does not appear to be edematous or warm. Skin: No rashes, lesions or ulcers Psychiatry: Judgement and insight appear normal. Mood & affect appropriate.       Data Reviewed: I have personally reviewed  following labs and imaging studies  CBC: Recent Labs  Lab 12/28/18 0101 12/29/18 0546 12/30/18 0521 12/31/18 0539 01/01/19 0536  WBC 15.6* 18.9* 15.7* 11.1* 13.3*  NEUTROABS 13.5*  --   --   --   --   HGB 14.5 12.5* 12.4* 11.9* 12.4*  HCT 43.0 38.1* 37.9* 36.0* 37.2*  MCV 94.9 96.5 96.2 95.5 94.4  PLT 244 195 206 265 [id number]   Basic Metabolic Panel: Recent Labs  Lab 12/28/18 0101 12/29/18 0546 12/30/18 0521 12/31/18 0539 01/01/19 0536  NA 136 131* 134*  --  135  K 4.5 4.2 4.1  --  4.3  CL 101 102 103  --  102  CO2 25 21* 23  --  22  GLUCOSE 100* 211* 125*  --  153*  BUN 15 16 17   --  15  CREATININE 1.18 1.15 1.12 1.08 1.02  CALCIUM 9.2 8.4* 8.4*  --  8.8*   GFR: Estimated Creatinine Clearance: 80.8 mL/min (by C-G formula based on SCr of 1.02 mg/dL). Liver Function Tests: Recent Labs  Lab 12/28/18 0101 01/01/19 0536  AST 44* 36  ALT 42 50*  ALKPHOS 56 78  BILITOT 1.8* 1.0  PROT 8.2* 7.4  ALBUMIN 4.5 3.3*   No results for input(s): LIPASE, AMYLASE in the last 168 hours. No results for input(s): AMMONIA in the last 168 hours. Coagulation Profile: No results for input(s): INR, PROTIME in the last 168 hours. Cardiac Enzymes: No results for input(s): CKTOTAL, CKMB, CKMBINDEX, TROPONINI in the last 168 hours. BNP (last 3 results) No results for input(s): PROBNP in the last 8760 hours. HbA1C: Recent Labs    12/30/18 0521  HGBA1C 5.7*   CBG: No results for input(s): GLUCAP in the last 168 hours. Lipid Profile: No results for input(s): CHOL, HDL, LDLCALC, TRIG, CHOLHDL, LDLDIRECT in the last 72 hours. Thyroid Function Tests: No results for input(s): TSH, T4TOTAL, FREET4, T3FREE, THYROIDAB in the last 72 hours. Anemia Panel: No results for input(s): VITAMINB12, FOLATE, FERRITIN, TIBC, IRON, RETICCTPCT in the last 72 hours. Sepsis Labs: No results for input(s): PROCALCITON, LATICACIDVEN in the last 168 hours.  Recent Results (from the past 240 hour(s))  SARS  CORONAVIRUS 2 (TAT 6-24 HRS) Nasopharyngeal Nasopharyngeal Swab     Status: None   Collection Time: 12/28/18  4:53 AM   Specimen: Nasopharyngeal Swab  Result Value Ref Range Status   SARS Coronavirus 2 NEGATIVE NEGATIVE Final    Comment: (NOTE) SARS-CoV-2 target nucleic acids are NOT DETECTED. The SARS-CoV-2 RNA is generally detectable in upper and lower respiratory specimens during the acute phase of infection. Negative results do not preclude SARS-CoV-2 infection, do not rule out co-infections with other pathogens, and should not be used as the sole basis for treatment or other patient management decisions. Negative results must be combined with clinical observations, patient history, and epidemiological information. The expected result is Negative. Fact Sheet for Patients: SugarRoll.be Fact Sheet for Healthcare Providers: https://www.woods-mathews.com/ This test is not yet approved or cleared by the [place] FDA and  has been authorized for detection and/or diagnosis of SARS-CoV-2 by FDA under an Emergency Use Authorization (EUA). This EUA will remain  in effect (meaning this test can be used) for the duration of the COVID-19 declaration under Section 56 4(b)(1) of the Act, 21 U.S.C. section 360bbb-3(b)(1), unless the authorization is terminated or revoked sooner. Performed at [facility], [place] [address], [place], [place] [address]   Body fluid culture     Status: None   Collection Time: 12/28/18 12:14 PM   Specimen: KNEE; Body Fluid  Result Value Ref Range Status   Specimen Description   Final    KNEE Performed at [place] [address], [place], [place] [address]    Special Requests   Final    Normal Performed at [facility], [place] [address], [place], [place] [address]    Gram Stain   Final    ABUNDANT WBC PRESENT, PREDOMINANTLY PMN MODERATE GRAM POSITIVE COCCI IN PAIRS IN  CLUSTERS Performed at [facility], [place] [address] [place], [place] [address]    Culture ABUNDANT STREPTOCOCCUS GROUP C  Final   Report Status 12/30/2018 FINAL  Final  Culture, blood (routine x 2)     Status: None (Preliminary result)   Collection Time: 12/31/18 10:13 PM   Specimen: Right Antecubital; Blood  Result Value Ref Range Status   Specimen Description   Final    RIGHT ANTECUBITAL Performed at [facility] [address], [place], [place] [address]    Special Requests   Final    BOTTLES DRAWN AEROBIC AND ANAEROBIC Blood Culture adequate volume Performed  at [facility], [place] [address] [place], [place] [address]    Culture   Final    NO GROWTH < 12 HOURS Performed at [place] [address], [place], [place] [address]    Report Status PENDING  Incomplete  Culture, blood (routine x 2)     Status: None (Preliminary result)   Collection Time: 12/31/18 10:13 PM   Specimen: BLOOD  Result Value Ref Range Status   Specimen Description   Final    BLOOD LEFT ARM Performed at [place] [address], [place], [place] [address]    Special Requests   Final    BOTTLES DRAWN AEROBIC AND ANAEROBIC Blood Culture adequate volume Performed at [place] [address], [place], [place] [address]    Culture   Final    NO GROWTH < 12 HOURS Performed at [place] [address] [place], [place] [address]    Report Status PENDING  Incomplete         Radiology Studies: Dg Chest Port 1 View  Result Date: 12/31/2018 CLINICAL DATA:  Fever EXAM: PORTABLE CHEST 1 VIEW COMPARISON:  Portable exam 2132 hours compared to 01/23/2007 FINDINGS: Normal heart size, mediastinal contours, and pulmonary vascularity. Opacities at the LEFT base and in RIGHT upper lobe, somewhat linear, could represent atelectasis or infiltrate. Remaining lungs clear. No pleural effusion or pneumothorax.  Osseous structures unremarkable. IMPRESSION: Somewhat linear opacities in the RIGHT upper lobe and at LEFT lung base, question atelectasis or infiltrate. Electronically Signed   By: Lavonia Dana M.D.   On: 12/31/2018 21:57        Scheduled Meds: . enoxaparin (LOVENOX) injection  40 mg Subcutaneous Daily  . influenza vac split quadrivalent PF  0.5 mL Intramuscular Tomorrow-1000  . multivitamin with minerals  1 tablet Oral Daily   Continuous Infusions: . sodium chloride 10 mL/hr at 01/01/19 0300  .  ceFAZolin (ANCEF) IV 2 g (01/01/19 0506)  . vancomycin       LOS: 3 days    Time spent: 12mins    Julionna Marczak Marry Guan, MD [facility]  If 7PM-7AM, please contact night-coverage  01/01/2019, 10:02 AM

## 2019-01-01 NOTE — Progress Notes (Signed)
   Subjective:  Patient complaining of pain in his right GT, ankle, knee that is reminiscent of previous gout attacks.  He states pain is also going up his thigh.  Objective:   VITALS:   Vitals:   12/31/18 2249 01/01/19 0027 01/01/19 0452 01/01/19 0814  BP: (!) 146/78 (!) 155/72 131/71 (!) 158/85  Pulse: 74 76 64 71  Resp: 18 18 17 20   Temp: 99.2 F (37.3 C) 99.1 F (37.3 C) 98.9 F (37.2 C) 98.7 F (37.1 C)  TempSrc: Oral Oral Oral Oral  SpO2: 97% 99% 98% 100%  Weight:      Height:        Surgical incision c/d/i No cellulitis, drainage, fluctuance Guards against movement of toe, ankle, knee, leg Compartments soft   Lab Results  Component Value Date   WBC 13.3 (H) 01/01/2019   HGB 12.4 (L) 01/01/2019   HCT 37.2 (L) 01/01/2019   MCV 94.4 01/01/2019   PLT 287 01/01/2019     Assessment/Plan:  4 Days Post-Op   - surgical incision is benign appearing, aquacel dressing reapplied - MRI of right femur pending - symptoms are suspicious for gout attack - would recommend treatment for this  Jasaun Plazola 01/01/2019, 9:41 AM [phone]

## 2019-01-01 NOTE — Progress Notes (Signed)
Bilateral lower extremity venous duplex has been completed. Preliminary results can be found in CV Proc through chart review.   01/01/19 12:58 PM Carlos Levering RVT

## 2019-01-02 ENCOUNTER — Inpatient Hospital Stay (HOSPITAL_COMMUNITY): Payer: Self-pay

## 2019-01-02 ENCOUNTER — Encounter (HOSPITAL_COMMUNITY): Payer: Self-pay

## 2019-01-02 DIAGNOSIS — M25469 Effusion, unspecified knee: Secondary | ICD-10-CM

## 2019-01-02 LAB — CBC
HCT: 37.6 % — ABNORMAL LOW (ref 39.0–52.0)
Hemoglobin: 12.5 g/dL — ABNORMAL LOW (ref 13.0–17.0)
MCH: 31.5 pg (ref 26.0–34.0)
MCHC: 33.2 g/dL (ref 30.0–36.0)
MCV: 94.7 fL (ref 80.0–100.0)
Platelets: 310 10*3/uL (ref 150–400)
RBC: 3.97 MIL/uL — ABNORMAL LOW (ref 4.22–5.81)
RDW: 11.7 % (ref 11.5–15.5)
WBC: 11.9 10*3/uL — ABNORMAL HIGH (ref 4.0–10.5)
nRBC: 0 % (ref 0.0–0.2)

## 2019-01-02 LAB — CREATININE, SERUM
Creatinine, Ser: 0.84 mg/dL (ref 0.61–1.24)
GFR calc Af Amer: >60 mL/min (ref >60)
GFR calc non Af Amer: >60 mL/min (ref >60)

## 2019-01-02 IMAGING — MR MR FEMUR*R* WO/W CM
10 series · 40 of 40 positions shown · IV contrast (0)
Comparison: None.

CLINICAL DATA: I and D of the right prepatellar bursa on [DATE].
Right lateral thigh pain.

EXAM:
MRI OF THE RIGHT FEMUR WITHOUT AND WITH CONTRAST
TECHNIQUE: Multiplanar, multisequence MR imaging of the right femur was
performed both before and after administration of intravenous
contrast.
CONTRAST:  9mL GADAVIST GADOBUTROL 1 MMOL/ML IV SOLN

[Series 4: T1 · axial · 5.0mm · 1.02mm/px · z∈[-167,+242]mm · 5 of 64 slices shown (1 of 2)]
[im 1/64]
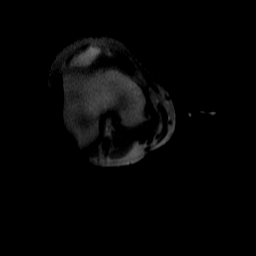
[im 16/64]
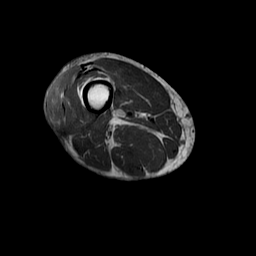
[im 32/64]
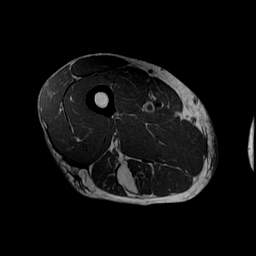
[im 48/64]
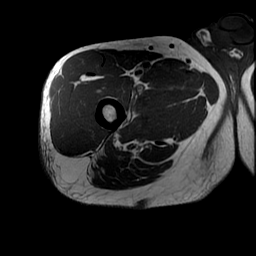
[im 64/64]
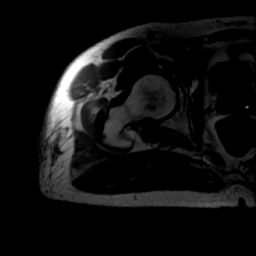

[Series 5: T1 fat-sat · axial · 5.0mm · 1.02mm/px · z∈[-167,+242]mm · 5 of 64 slices shown (1 of 2)]
[im 1/64]
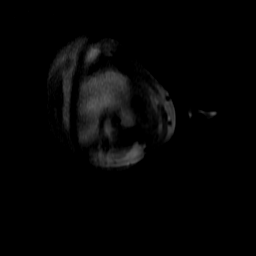
[im 16/64]
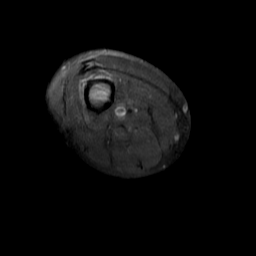
[im 32/64]
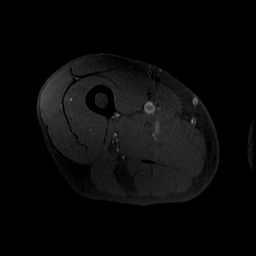
[im 48/64]
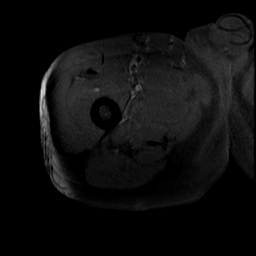
[im 64/64]
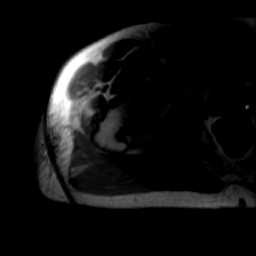

[Series 6: axial fse ir · axial · 5.0mm · 1.02mm/px · z∈[-167,+242]mm · 5 of 64 slices shown]
[im 1/64]
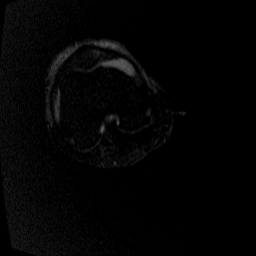
[im 16/64]
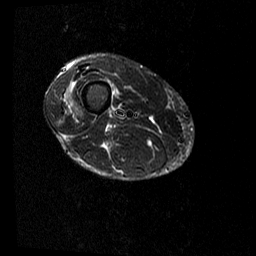
[im 32/64]
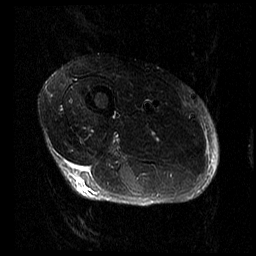
[im 48/64]
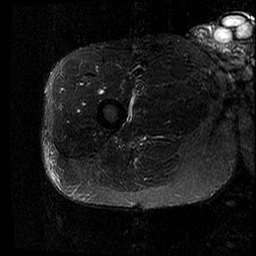
[im 64/64]
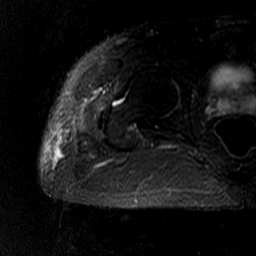

[Series 7: T2 fat-sat · axial · 5.0mm · 1.02mm/px · z∈[-167,+242]mm · 5 of 64 slices shown (1 of 2)]
[im 1/64]
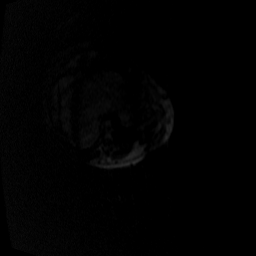
[im 16/64]
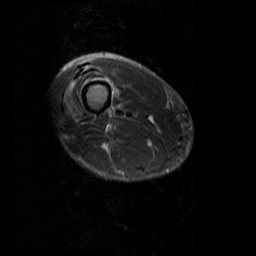
[im 32/64]
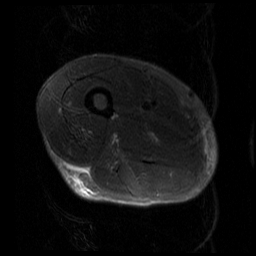
[im 48/64]
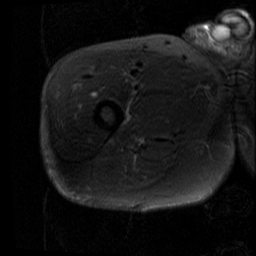
[im 64/64]
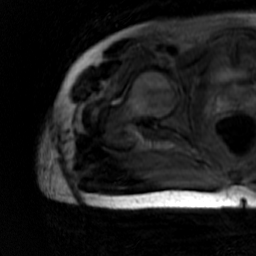

[Series 8: T1 · coronal · 4.4mm · 1.72mm/px · 3 of 38 slices shown (2 of 2)]
[im 1/38]
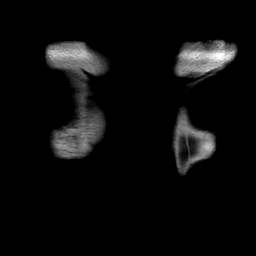
[im 19/38]
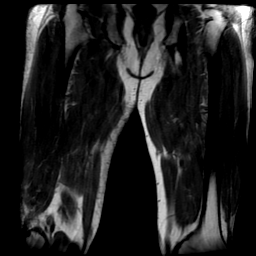
[im 38/38]
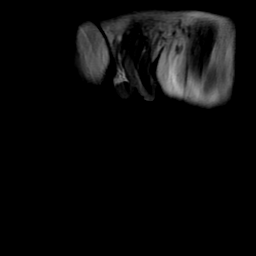

[Series 9: T1 fat-sat · coronal · 4.4mm · 1.72mm/px · 3 of 38 slices shown (2 of 2)]
[im 1/38]
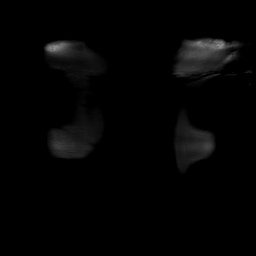
[im 19/38]
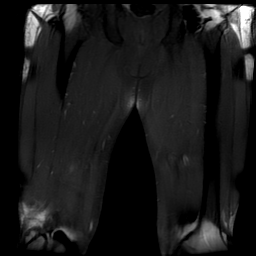
[im 38/38]
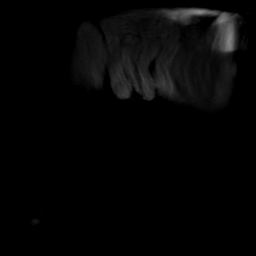

[Series 10: sag fse ir · sagittal · 5.0mm · 1.64mm/px · 3 of 38 slices shown]
[im 1/38]
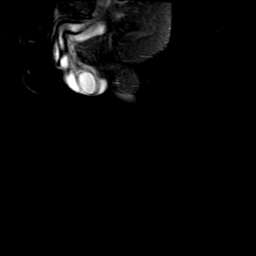
[im 19/38]
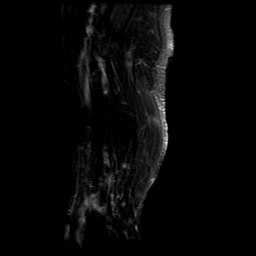
[im 38/38]
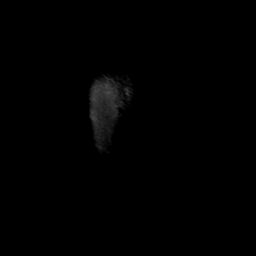

[Series 11: T2 fat-sat · coronal · 4.4mm · 1.72mm/px · 3 of 39 slices shown (2 of 2)]
[im 1/39]
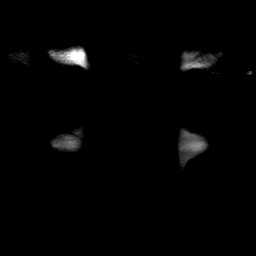
[im 20/39]
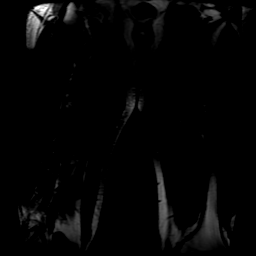
[im 39/39]
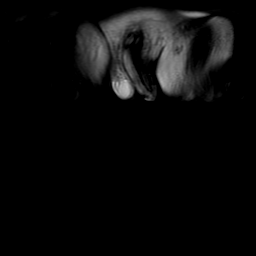

[Series 12: T1 fat-sat post-contrast · axial · 5.0mm · 1.02mm/px · z∈[-167,+242]mm · 5 of 64 slices shown]
[im 1/64]
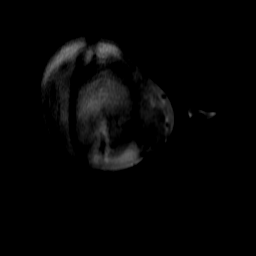
[im 16/64]
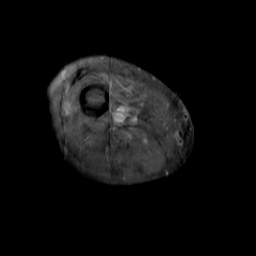
[im 32/64]
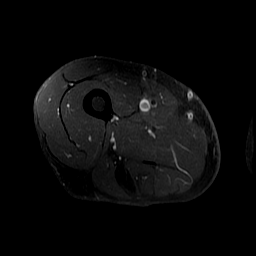
[im 48/64]
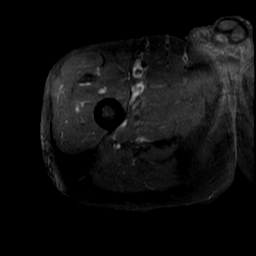
[im 64/64]
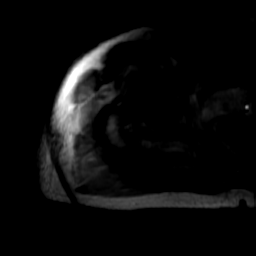

[Series 13: T1 post-contrast · coronal · 4.4mm · 1.72mm/px · 3 of 38 slices shown]
[im 1/38]
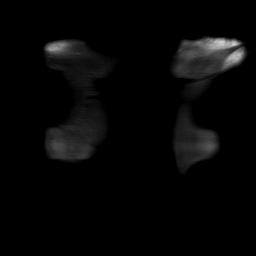
[im 19/38]
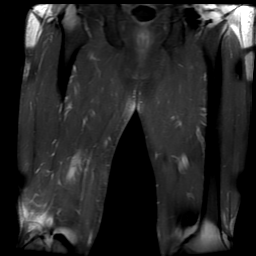
[im 38/38]
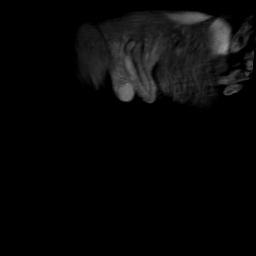

[40 of 40 positions shown; findings below may reference images not displayed]

FINDINGS: Bones/Joint/Cartilage

No marrow signal abnormality. No fracture or dislocation. Normal
alignment. No periosteal reaction or bone destruction. No aggressive
osseous lesion. No hip joint effusion.

Large knee joint effusion.  Mild prepatellar soft tissue edema.

Ligaments

Collateral ligaments are intact.  Lisfranc ligament is intact.

Muscles and Tendons
No muscle edema. Muscles are normal. No intramuscular fluid
collection or hematoma.

Soft tissue
Small amount of soft tissue edema along the posterolateral aspect
and medial aspect of the mid thigh which may be reactive versus
secondary to mild cellulitis. No drainable fluid collection. No soft
tissue mass.
IMPRESSION: 1. No osteomyelitis of the right femur.
2. Large knee joint effusion which may be reactive, but septic
arthritis cannot be excluded. Prepatellar soft tissue edema.
3. Small amount of soft tissue edema along the posterolateral aspect
and medial aspect of the mid thigh which may be reactive versus
secondary to mild cellulitis. No drainable fluid collection to
suggest an abscess.

## 2019-01-02 MED ORDER — GADOBUTROL 1 MMOL/ML IV SOLN
9.0000 mL | Freq: Once | INTRAVENOUS | Status: AC | PRN
  Administered 2019-01-02: 9 mL via INTRAVENOUS

## 2019-01-02 MED ORDER — COLCHICINE 0.6 MG PO TABS
0.6000 mg | ORAL_TABLET | Freq: Two times a day (BID) | ORAL | Status: DC
  Administered 2019-01-02 – 2019-01-04 (×5): 0.6 mg via ORAL
  Filled 2019-01-02 (×6): qty 1

## 2019-01-02 MED ORDER — METHYLPREDNISOLONE SODIUM SUCC 40 MG IJ SOLR
20.0000 mg | Freq: Once | INTRAMUSCULAR | Status: AC
  Administered 2019-01-02: 20 mg via INTRAVENOUS
  Filled 2019-01-02: qty 1

## 2019-01-02 NOTE — Progress Notes (Signed)
Patient ID: [name], male   DOB: [birth year], 63 y.o.   MRN: [id number] I did come by the bedside to check on the patient this afternoon.  His right knee is done very well since surgery and the swelling is now minimum.  There is no drainage from the right knee incision.  The redness is resolved in his right lower extremity as well.  He still reports significant right thigh pain but I cannot palpate any deficits in the thigh.  There is no induration.  There is no masses or swelling.  There is no cellulitis about his thigh.  His right ankle is swollen but there is no redness.  He would not let me put him through much range of motion of the right ankle and there is significant guarding.  There is no evidence of an abscess.  There is no wounds.  I still feel that this may be a flareup of an inflammatory process.  I will defer to the primary team but I do feel that at least trying a dose of IV steroids such as prednisone may be worthwhile to try to see how he responds based on the fact that he is doing well from an infection standpoint with his knee.  I have discussed this further.  If the primary care team feels appropriate, I would have him get an IV dose of 20 mg of prednisone today and then a one-time IV dose of 20 mg prednisone tomorrow to see how he responds to the inflammatory component of this.

## 2019-01-02 NOTE — Progress Notes (Signed)
[facility]  PROGRESS NOTE  [name] [id number] DOB: 1955-03-19 DOA: 12/28/2018 PCP: Patient, No Pcp Per   Brief HPI:   63 year old male with history of arthritis, gout came with right knee pain.  He had swelling to the patellar region, pain redness and swelling then quickly spread.  Developed erythema about the entire right knee streaking up the leg.  He was subsequently seen by orthopedics and taken to the OR on the evening of 02 28 for prepatellar abscess incision and drainage and washout.    Subjective   Patient seen and examined, still complains of right knee pain and swelling.   Assessment/Plan:     1. Status post incision drainage of infected prepatellar bursa-patient underwent incision and drainage of the infected prepatellar bursa on 1028, gross purulence in the prepatellar bursa of the right knee was noted which was washed out by orthopedics.  Intraoperative culture is positive for group C streptococcus.  Currently patient is on vancomycin and cefazolin.  Dr. Renaee Munda discussed with ID who recommended transition to Augmentin for 3 weeks on discharge.    2. Persistent knee swelling-patient has persistent swelling of the right lower extremity, venous duplex of lower extremities was negative for DVT.  MRI of the right femur is currently pending.  He was seen by Dr. Ninfa Linden today, who feels that this is a flareup of inflammatory process likely gout.  He recommended to start 20 mg of prednisone IV today and 1 dose tomorrow.  Will give Solu-Medrol 20 mg IV x1 tonight.        CBG: No results for input(s): GLUCAP in the last 168 hours.  CBC: Recent Labs  Lab 12/28/18 0101 12/29/18 0546 12/30/18 0521 12/31/18 0539 01/01/19 0536 01/02/19 0533  WBC 15.6* 18.9* 15.7* 11.1* 13.3* 11.9*  NEUTROABS 13.5*  --   --   --   --   --   HGB 14.5 12.5* 12.4* 11.9* 12.4* 12.5*  HCT 43.0 38.1* 37.9* 36.0* 37.2* 37.6*  MCV 94.9 96.5 96.2 95.5 94.4 94.7  PLT 244 195 206  265 287 [id number]    Basic Metabolic Panel: Recent Labs  Lab 12/28/18 0101 12/29/18 0546 12/30/18 0521 12/31/18 0539 01/01/19 0536 01/02/19 0533  NA 136 131* 134*  --  135  --   K 4.5 4.2 4.1  --  4.3  --   CL 101 102 103  --  102  --   CO2 25 21* 23  --  22  --   GLUCOSE 100* 211* 125*  --  153*  --   BUN 15 16 17   --  15  --   CREATININE 1.18 1.15 1.12 1.08 1.02 0.84  CALCIUM 9.2 8.4* 8.4*  --  8.8*  --      DVT prophylaxis: Lovenox  Code Status: Full code  Family Communication: No family at bedside  Disposition Plan: likely home when medically ready for discharge        BMI  Estimated body mass index is 29.99 kg/m as calculated from the following:   Height as of this encounter: 5' 8.5" (1.74 m).   Weight as of this encounter: 90.8 kg.  Scheduled medications:  . colchicine  0.6 mg Oral BID  . enoxaparin (LOVENOX) injection  40 mg Subcutaneous Daily  . influenza vac split quadrivalent PF  0.5 mL Intramuscular Tomorrow-1000  . multivitamin with minerals  1 tablet Oral Daily    Consultants:  Orthopedics  Procedures:  Incision and drainage of infected prepatellar bursa  Antibiotics:   Anti-infectives (From admission, onward)   Start     Dose/Rate Route Frequency Ordered Stop   01/01/19 1600  vancomycin (VANCOCIN) IVPB 1000 mg/200 mL premix     1,000 mg 200 mL/hr over 60 Minutes Intravenous Every 12 hours 12/31/18 2216     12/31/18 2215  vancomycin (VANCOCIN) 2,000 mg in sodium chloride 0.9 % 500 mL IVPB     2,000 mg 250 mL/hr over 120 Minutes Intravenous  Once 12/31/18 2145 01/01/19 0119   12/29/18 1430  ceFAZolin (ANCEF) IVPB 2g/100 mL premix     2 g 200 mL/hr over 30 Minutes Intravenous Every 8 hours 12/29/18 1416     12/28/18 2200  vancomycin (VANCOCIN) 1,750 mg in sodium chloride 0.9 % 500 mL IVPB  Status:  Discontinued     1,750 mg 250 mL/hr over 120 Minutes Intravenous Every 24 hours 12/28/18 1545 12/29/18 1418   12/28/18 1600  vancomycin  (VANCOCIN) IVPB 1000 mg/200 mL premix     1,000 mg 200 mL/hr over 60 Minutes Intravenous  Once 12/28/18 1545 12/28/18 1729   12/28/18 1530  vancomycin (VANCOCIN) 1,500 mg in sodium chloride 0.9 % 500 mL IVPB  Status:  Discontinued     1,500 mg 250 mL/hr over 120 Minutes Intravenous  Once 12/28/18 1531 12/28/18 1545   12/28/18 0600  ceFAZolin (ANCEF) IVPB 1 g/50 mL premix  Status:  Discontinued     1 g 100 mL/hr over 30 Minutes Intravenous Every 8 hours 12/28/18 0458 12/29/18 1416   12/28/18 0315  ceFAZolin (ANCEF) IVPB 1 g/50 mL premix     1 g 100 mL/hr over 30 Minutes Intravenous  Once 12/28/18 0313 12/28/18 0355       Objective   Vitals:   01/01/19 1622 01/01/19 2100 01/02/19 0525 01/02/19 1409  BP: (!) 141/70 (!) 149/75 123/70 (!) 152/81  Pulse: 68 62 68 75  Resp: 20 18 18 16   Temp: 99.4 F (37.4 C) 99.6 F (37.6 C) 99 F (37.2 C) 99.6 F (37.6 C)  TempSrc: Oral Oral Oral Oral  SpO2: 97% 95% 98% 93%  Weight:      Height:        Intake/Output Summary (Last 24 hours) at 01/02/2019 1737 Last data filed at 01/02/2019 1500 Gross per 24 hour  Intake 1575.82 ml  Output 1150 ml  Net 425.82 ml   Filed Weights   12/28/18 1529  Weight: 90.8 kg     Physical Examination:    General: Appears in no acute distress  Cardiovascular: S1-S2, regular, no murmur auscultated  Respiratory: Clear to auscultation bilaterally  Abdomen: Abdomen is soft, nontender, no organomegaly  Extremities: Right lower extremity is edematous, swelling noted around the knee, tender to palpation  Neurologic: Cranial nerves II through XII grossly intact, intact insight and judgment, no focal deficit noted     Data Reviewed: I have personally reviewed following labs and imaging studies   Recent Results (from the past 240 hour(s))  SARS CORONAVIRUS 2 (TAT 6-24 HRS) Nasopharyngeal Nasopharyngeal Swab     Status: None   Collection Time: 12/28/18  4:53 AM   Specimen: Nasopharyngeal Swab   Result Value Ref Range Status   SARS Coronavirus 2 NEGATIVE NEGATIVE Final    Comment: (NOTE) SARS-CoV-2 target nucleic acids are NOT DETECTED. The SARS-CoV-2 RNA is generally detectable in upper and lower respiratory specimens during the acute phase of infection. Negative results do not preclude SARS-CoV-2 infection, do not rule out co-infections with other pathogens, and  should not be used as the sole basis for treatment or other patient management decisions. Negative results must be combined with clinical observations, patient history, and epidemiological information. The expected result is Negative. Fact Sheet for Patients: SugarRoll.be Fact Sheet for Healthcare Providers: https://www.woods-mathews.com/ This test is not yet approved or cleared by the [place] FDA and  has been authorized for detection and/or diagnosis of SARS-CoV-2 by FDA under an Emergency Use Authorization (EUA). This EUA will remain  in effect (meaning this test can be used) for the duration of the COVID-19 declaration under Section 56 4(b)(1) of the Act, 21 U.S.C. section 360bbb-3(b)(1), unless the authorization is terminated or revoked sooner. Performed at [facility], [place] [address], [place], [place] [address]   Body fluid culture     Status: None   Collection Time: 12/28/18 12:14 PM   Specimen: KNEE; Body Fluid  Result Value Ref Range Status   Specimen Description   Final    KNEE Performed at [place] [facility] [place] [address] [place], [place] [address]    Special Requests   Final    Normal Performed at [facility], [place] [address], [place], [place] [address]    Gram Stain   Final    ABUNDANT WBC PRESENT, PREDOMINANTLY PMN MODERATE GRAM POSITIVE COCCI IN PAIRS IN CLUSTERS Performed at [facility], [place] [address], [place], [place] [address]    Culture ABUNDANT STREPTOCOCCUS GROUP C  Final   Report  Status 12/30/2018 FINAL  Final  Culture, blood (routine x 2)     Status: None (Preliminary result)   Collection Time: 12/31/18 10:13 PM   Specimen: BLOOD  Result Value Ref Range Status   Specimen Description   Final    BLOOD RIGHT ANTECUBITAL Performed at [facility], [place] [address], [place], [place] [address]    Special Requests   Final    BOTTLES DRAWN AEROBIC AND ANAEROBIC Blood Culture adequate volume Performed at [place] [address], [place], [place] [address]    Culture   Final    NO GROWTH 1 DAY Performed at [facility], [place] [address], [place], [place] [address]    Report Status PENDING  Incomplete  Culture, blood (routine x 2)     Status: None (Preliminary result)   Collection Time: 12/31/18 10:13 PM   Specimen: BLOOD  Result Value Ref Range Status   Specimen Description   Final    BLOOD LEFT ARM Performed at [place] [address] [place], [place] [address]    Special Requests   Final    BOTTLES DRAWN AEROBIC AND ANAEROBIC Blood Culture adequate volume Performed at [place] [address], [place], [place] [address]    Culture   Final    NO GROWTH 1 DAY Performed at [facility], [place] [address], [place], [place] [address]    Report Status PENDING  Incomplete     Liver Function Tests: Recent Labs  Lab 12/28/18 0101 01/01/19 0536  AST 44* 36  ALT 42 50*  ALKPHOS 56 78  BILITOT 1.8* 1.0  PROT 8.2* 7.4  ALBUMIN 4.5 3.3*   No results for input(s): LIPASE, AMYLASE in the last 168 hours. No results for input(s): AMMONIA in the last 168 hours.  Cardiac Enzymes: No results for input(s): CKTOTAL, CKMB, CKMBINDEX, TROPONINI in the last 168 hours. BNP (last 3 results) No results for input(s): BNP  in the last 8760 hours.  ProBNP (last 3 results) No results for input(s): PROBNP in the last 8760 hours.    Studies: Dg Chest Port 1 View  Result Date:  12/31/2018 CLINICAL DATA:  Fever EXAM: PORTABLE CHEST 1 VIEW COMPARISON:  Portable exam 2132 hours compared to 01/23/2007 FINDINGS: Normal heart size, mediastinal contours, and pulmonary vascularity. Opacities at the LEFT base and in RIGHT upper lobe, somewhat linear, could represent atelectasis or infiltrate. Remaining lungs clear. No pleural effusion or pneumothorax. Osseous structures unremarkable. IMPRESSION: Somewhat linear opacities in the RIGHT upper lobe and at LEFT lung base, question atelectasis or infiltrate. Electronically Signed   By: Lavonia Dana M.D.   On: 12/31/2018 21:57   Vas [name] Lower Extremity Venous (dvt)  Result Date: 01/01/2019  Lower Venous Study Indications: Pain.  Risk Factors: Surgery. Limitations: Poor ultrasound/tissue interface, bandages and patient immobility. Comparison Study: No prior studies. Performing Technologist: Oliver Hum RVT  Examination Guidelines: A complete evaluation includes B-mode imaging, spectral Doppler, color Doppler, and power Doppler as needed of all accessible portions of each vessel. Bilateral testing is considered an integral part of a complete examination. Limited examinations for reoccurring indications may be performed as noted.  +---------+---------------+---------+-----------+----------+--------------+ RIGHT    CompressibilityPhasicitySpontaneityPropertiesThrombus Aging +---------+---------------+---------+-----------+----------+--------------+ CFV      Full           Yes      Yes                                 +---------+---------------+---------+-----------+----------+--------------+ SFJ      Full                                                        +---------+---------------+---------+-----------+----------+--------------+ FV Prox  Full                                                        +---------+---------------+---------+-----------+----------+--------------+ FV Mid   Full                                                         +---------+---------------+---------+-----------+----------+--------------+ FV DistalFull                                                        +---------+---------------+---------+-----------+----------+--------------+ PFV      Full                                                        +---------+---------------+---------+-----------+----------+--------------+ POP      Full           Yes  Yes                                 +---------+---------------+---------+-----------+----------+--------------+ PTV      Full                                                        +---------+---------------+---------+-----------+----------+--------------+ PERO     Full                                                        +---------+---------------+---------+-----------+----------+--------------+   +----+---------------+---------+-----------+----------+--------------+ LEFTCompressibilityPhasicitySpontaneityPropertiesThrombus Aging +----+---------------+---------+-----------+----------+--------------+ CFV Full           Yes      Yes                                 +----+---------------+---------+-----------+----------+--------------+     Summary: Right: There is no evidence of deep vein thrombosis in the lower extremity. No cystic structure found in the popliteal fossa. Left: No evidence of common femoral vein obstruction.  *See table(s) above for measurements and observations. Electronically signed by Deitra Mayo MD on 01/01/2019 at 2:44:33 PM.    Final      Admission status: Inpatient: Based on patients clinical presentation and evaluation of above clinical data, I have made determination that patient meets Inpatient criteria at this time.   [facility] Pager [phone]. If 7PM-7AM, please contact night-coverage at [email or web address], Office  [phone]  password [email or web address]  01/02/2019, 5:37 PM  LOS: 4 days

## 2019-01-02 NOTE — Progress Notes (Signed)
PT Cancellation Note  Patient Details Name: [name] MRN: [id number] DOB: [birth year]   Cancelled Treatment:    Reason Eval/Treat Not Completed: Pain limiting ability to participate  Despite having pain meds, pt still reports pain too bad to participate (he is resting without symptoms). States getting up won't help his leg any, PT agreed but tried to encourage for his overall health.  Educated on importance of mobility but continued to refuse.  States he is sitting up in bed and gets lovenox for blood clots.  Did give pt gait belt with loop to help with R LE ankle ROM and moving R LE - pt demonstrated and liked using gait belt. Will f/u tomorrow.   Karlton Lemon 01/02/2019, 12:47 PM

## 2019-01-03 LAB — CBC
HCT: 39.8 % (ref 39.0–52.0)
Hemoglobin: 13.3 g/dL (ref 13.0–17.0)
MCH: 31.2 pg (ref 26.0–34.0)
MCHC: 33.4 g/dL (ref 30.0–36.0)
MCV: 93.4 fL (ref 80.0–100.0)
Platelets: 374 10*3/uL (ref 150–400)
RBC: 4.26 MIL/uL (ref 4.22–5.81)
RDW: 11.5 % (ref 11.5–15.5)
WBC: 10.8 10*3/uL — ABNORMAL HIGH (ref 4.0–10.5)
nRBC: 0 % (ref 0.0–0.2)

## 2019-01-03 LAB — BASIC METABOLIC PANEL
Anion gap: 10 (ref 5–15)
BUN: 22 mg/dL (ref 8–23)
CO2: 23 mmol/L (ref 22–32)
Calcium: 9.1 mg/dL (ref 8.9–10.3)
Chloride: 98 mmol/L (ref 98–111)
Creatinine, Ser: 1.23 mg/dL (ref 0.61–1.24)
GFR calc Af Amer: >60 mL/min (ref >60)
GFR calc non Af Amer: >60 mL/min (ref >60)
Glucose, Bld: 231 mg/dL — ABNORMAL HIGH (ref 70–99)
Potassium: 5.2 mmol/L — ABNORMAL HIGH (ref 3.5–5.1)
Sodium: 131 mmol/L — ABNORMAL LOW (ref 135–145)

## 2019-01-03 MED ORDER — BISACODYL 5 MG PO TBEC
5.0000 mg | DELAYED_RELEASE_TABLET | Freq: Every day | ORAL | Status: DC | PRN
  Administered 2019-01-03: 5 mg via ORAL
  Filled 2019-01-03: qty 1

## 2019-01-03 MED ORDER — POLYETHYLENE GLYCOL 3350 17 G PO PACK
17.0000 g | PACK | Freq: Every day | ORAL | Status: DC
  Administered 2019-01-03: 17 g via ORAL
  Filled 2019-01-03: qty 1

## 2019-01-03 MED ORDER — METHYLPREDNISOLONE SODIUM SUCC 40 MG IJ SOLR
20.0000 mg | Freq: Once | INTRAMUSCULAR | Status: AC
  Administered 2019-01-03: 20 mg via INTRAVENOUS
  Filled 2019-01-03: qty 1

## 2019-01-03 NOTE — Progress Notes (Signed)
[facility]  PROGRESS NOTE  [name] [id number] DOB: [birth year] DOA: 12/28/2018 PCP: Patient, No Pcp Per   Brief HPI:   63 year old male with history of arthritis, gout came with right knee pain.  He had swelling to the patellar region, pain redness and swelling then quickly spread.  Developed erythema about the entire right knee streaking up the leg.  He was subsequently seen by orthopedics and taken to the OR on the evening of 02 28 for prepatellar abscess incision and drainage and washout.    Subjective   Patient seen and examined, right knee swelling has improved after he received 1 dose of Solu-Medrol 20 mg IV yesterday.   Assessment/Plan:     1. Status post incision drainage of infected prepatellar bursa-patient underwent incision and drainage of the infected prepatellar bursa on 1028, gross purulence in the prepatellar bursa of the right knee was noted which was washed out by orthopedics.  Intraoperative culture is positive for group C streptococcus.  Currently patient is on vancomycin and cefazolin.  Dr. Renaee Munda discussed with ID who recommended transition to Augmentin for 3 weeks on discharge.    2. Persistent knee swelling-improved after he received 20 mg of Solu-Medrol IV yesterday.  Patient has persistent swelling of the right lower extremity, venous duplex of lower extremities was negative for DVT.  MRI of the right femur is currently pending.  He was seen by Dr. Ninfa Linden today, who feels that this is a flareup of inflammatory process likely gout.  He recommended to start 20 mg of prednisone IV .  He received 1 dose yesterday, will repeat 1 dose today.  Hopefully can be discharged tomorrow        CBG: No results for input(s): GLUCAP in the last 168 hours.  CBC: Recent Labs  Lab 12/28/18 0101  12/30/18 0521 12/31/18 0539 01/01/19 0536 01/02/19 0533 01/03/19 0523  WBC 15.6*   < > 15.7* 11.1* 13.3* 11.9* 10.8*  NEUTROABS 13.5*  --   --   --   --   --    --   HGB 14.5   < > 12.4* 11.9* 12.4* 12.5* 13.3  HCT 43.0   < > 37.9* 36.0* 37.2* 37.6* 39.8  MCV 94.9   < > 96.2 95.5 94.4 94.7 93.4  PLT 244   < > 206 265 287 310 374   < > = values in this interval not displayed.    Basic Metabolic Panel: Recent Labs  Lab 12/28/18 0101 12/29/18 0546 12/30/18 0521 12/31/18 0539 01/01/19 0536 01/02/19 0533 01/03/19 0523  NA 136 131* 134*  --  135  --  131*  K 4.5 4.2 4.1  --  4.3  --  5.2*  CL 101 102 103  --  102  --  98  CO2 25 21* 23  --  22  --  23  GLUCOSE 100* 211* 125*  --  153*  --  231*  BUN 15 16 17   --  15  --  22  CREATININE 1.18 1.15 1.12 1.08 1.02 0.84 1.23  CALCIUM 9.2 8.4* 8.4*  --  8.8*  --  9.1     DVT prophylaxis: Lovenox  Code Status: Full code  Family Communication: No family at bedside  Disposition Plan: likely home in next 24 hours        BMI  Estimated body mass index is 29.99 kg/m as calculated from the following:   Height as of this encounter: 5' 8.5" (1.74 m).  Weight as of this encounter: 90.8 kg.  Scheduled medications:  . colchicine  0.6 mg Oral BID  . enoxaparin (LOVENOX) injection  40 mg Subcutaneous Daily  . influenza vac split quadrivalent PF  0.5 mL Intramuscular Tomorrow-1000  . multivitamin with minerals  1 tablet Oral Daily    Consultants:  Orthopedics  Procedures:  Incision and drainage of infected prepatellar bursa   Antibiotics:   Anti-infectives (From admission, onward)   Start     Dose/Rate Route Frequency Ordered Stop   01/01/19 1600  vancomycin (VANCOCIN) IVPB 1000 mg/200 mL premix     1,000 mg 200 mL/hr over 60 Minutes Intravenous Every 12 hours 12/31/18 2216     12/31/18 2215  vancomycin (VANCOCIN) 2,000 mg in sodium chloride 0.9 % 500 mL IVPB     2,000 mg 250 mL/hr over 120 Minutes Intravenous  Once 12/31/18 2145 01/01/19 0119   12/29/18 1430  ceFAZolin (ANCEF) IVPB 2g/100 mL premix     2 g 200 mL/hr over 30 Minutes Intravenous Every 8 hours 12/29/18  1416     12/28/18 2200  vancomycin (VANCOCIN) 1,750 mg in sodium chloride 0.9 % 500 mL IVPB  Status:  Discontinued     1,750 mg 250 mL/hr over 120 Minutes Intravenous Every 24 hours 12/28/18 1545 12/29/18 1418   12/28/18 1600  vancomycin (VANCOCIN) IVPB 1000 mg/200 mL premix     1,000 mg 200 mL/hr over 60 Minutes Intravenous  Once 12/28/18 1545 12/28/18 1729   12/28/18 1530  vancomycin (VANCOCIN) 1,500 mg in sodium chloride 0.9 % 500 mL IVPB  Status:  Discontinued     1,500 mg 250 mL/hr over 120 Minutes Intravenous  Once 12/28/18 1531 12/28/18 1545   12/28/18 0600  ceFAZolin (ANCEF) IVPB 1 g/50 mL premix  Status:  Discontinued     1 g 100 mL/hr over 30 Minutes Intravenous Every 8 hours 12/28/18 0458 12/29/18 1416   12/28/18 0315  ceFAZolin (ANCEF) IVPB 1 g/50 mL premix     1 g 100 mL/hr over 30 Minutes Intravenous  Once 12/28/18 0313 12/28/18 0355       Objective   Vitals:   01/02/19 1409 01/02/19 2114 01/03/19 0542 01/03/19 1459  BP: (!) 152/81 (!) 145/78 123/69 140/76  Pulse: 75 76 66 77  Resp: 16 16 18 18   Temp: 99.6 F (37.6 C) 100.1 F (37.8 C) 98.2 F (36.8 C) 98.5 F (36.9 C)  TempSrc: Oral Oral Oral Oral  SpO2: 93% 94% 96% 99%  Weight:      Height:        Intake/Output Summary (Last 24 hours) at 01/03/2019 1544 Last data filed at 01/03/2019 0423 Gross per 24 hour  Intake 340 ml  Output 600 ml  Net -260 ml   Filed Weights   12/28/18 1529  Weight: 90.8 kg     Physical Examination:   General-appears in no acute distress Heart-S1-S2, regular, no murmur auscultated Lungs-clear to auscultation bilaterally, no wheezing or crackles auscultated Abdomen-soft, nontender, no organomegaly Extremities-right lower extremity is edematous.  Right knee swelling noted.  No erythema, nontender to palpation. Neuro-alert, oriented x3, no focal deficit noted    Data Reviewed: I have personally reviewed following labs and imaging studies   Recent Results (from the past  240 hour(s))  SARS CORONAVIRUS 2 (TAT 6-24 HRS) Nasopharyngeal Nasopharyngeal Swab     Status: None   Collection Time: 12/28/18  4:53 AM   Specimen: Nasopharyngeal Swab  Result Value Ref Range Status  SARS Coronavirus 2 NEGATIVE NEGATIVE Final    Comment: (NOTE) SARS-CoV-2 target nucleic acids are NOT DETECTED. The SARS-CoV-2 RNA is generally detectable in upper and lower respiratory specimens during the acute phase of infection. Negative results do not preclude SARS-CoV-2 infection, do not rule out co-infections with other pathogens, and should not be used as the sole basis for treatment or other patient management decisions. Negative results must be combined with clinical observations, patient history, and epidemiological information. The expected result is Negative. Fact Sheet for Patients: SugarRoll.be Fact Sheet for Healthcare Providers: https://www.woods-mathews.com/ This test is not yet approved or cleared by the [place] FDA and  has been authorized for detection and/or diagnosis of SARS-CoV-2 by FDA under an Emergency Use Authorization (EUA). This EUA will remain  in effect (meaning this test can be used) for the duration of the COVID-19 declaration under Section 56 4(b)(1) of the Act, 21 U.S.C. section 360bbb-3(b)(1), unless the authorization is terminated or revoked sooner. Performed at [facility], [place] [address] [place], [place] [address]   Body fluid culture     Status: None   Collection Time: 12/28/18 12:14 PM   Specimen: KNEE; Body Fluid  Result Value Ref Range Status   Specimen Description   Final    KNEE Performed at [facility] [address] [place], [place] [address]    Special Requests   Final    Normal Performed at [facility], [place] [address], [place], [place] [address]    Gram Stain   Final    ABUNDANT WBC PRESENT, PREDOMINANTLY PMN MODERATE GRAM  POSITIVE COCCI IN PAIRS IN CLUSTERS Performed at [facility], [place] [address] [place], [place] [address]    Culture ABUNDANT STREPTOCOCCUS GROUP C  Final   Report Status 12/30/2018 FINAL  Final  Culture, blood (routine x 2)     Status: None (Preliminary result)   Collection Time: 12/31/18 10:13 PM   Specimen: BLOOD  Result Value Ref Range Status   Specimen Description   Final    BLOOD RIGHT ANTECUBITAL Performed at [facility], [place] [address] [place], [place] [address]    Special Requests   Final    BOTTLES DRAWN AEROBIC AND ANAEROBIC Blood Culture adequate volume Performed at [facility] [address], [place], [place] [address]    Culture   Final    NO GROWTH 2 DAYS Performed at [facility] [address], [place], [place] [address]    Report Status PENDING  Incomplete  Culture, blood (routine x 2)     Status: None (Preliminary result)   Collection Time: 12/31/18 10:13 PM   Specimen: BLOOD  Result Value Ref Range Status   Specimen Description   Final    BLOOD LEFT ARM Performed at [facility] [address], [place], [place] [address]    Special Requests   Final    BOTTLES DRAWN AEROBIC AND ANAEROBIC Blood Culture adequate volume Performed at [facility] [address] [place], [place] [address]    Culture   Final    NO GROWTH 2 DAYS Performed at [facility] [address] [place], [place] [address]    Report Status PENDING  Incomplete     Liver Function Tests: Recent Labs  Lab 12/28/18 0101 01/01/19 0536  AST 44* 36  ALT 42 50*  ALKPHOS 56 78  BILITOT 1.8* 1.0  PROT 8.2* 7.4  ALBUMIN 4.5 3.3*   No results for input(s): LIPASE, AMYLASE in the last 168 hours. No results for input(s): AMMONIA in the last 168 hours.  Cardiac Enzymes: No results for input(s): CKTOTAL, CKMB, CKMBINDEX, TROPONINI in the last 168 hours. BNP (last 3 results) No results for input(s):  BNP in the last 8760 hours.  ProBNP (last 3 results) No results for input(s): PROBNP in the last 8760 hours.    Studies: Mr Femur Right W Wo Contrast  Result Date: 01/02/2019 CLINICAL DATA:  I and D of the right prepatellar bursa on 10/28. Right lateral thigh pain. EXAM: MRI OF THE RIGHT FEMUR WITHOUT AND WITH CONTRAST TECHNIQUE: Multiplanar, multisequence MR imaging of the right femur was performed both before and after administration of intravenous contrast. CONTRAST:  17mL GADAVIST GADOBUTROL 1 MMOL/ML IV SOLN COMPARISON:  None. FINDINGS: Bones/Joint/Cartilage No marrow signal abnormality. No fracture or dislocation. Normal alignment. No periosteal reaction or bone destruction. No aggressive osseous lesion. No hip joint effusion. Large knee joint effusion.  Mild prepatellar soft tissue edema. Ligaments Collateral ligaments are intact.  Lisfranc ligament is intact. Muscles and Tendons No muscle edema. Muscles are normal. No intramuscular fluid collection or hematoma. Soft tissue Small amount of soft tissue edema along the posterolateral aspect and medial aspect of the mid thigh which may be reactive versus secondary to mild cellulitis. No drainable fluid collection. No soft tissue mass. IMPRESSION: 1. No osteomyelitis of the right femur. 2. Large knee joint effusion which may be reactive, but septic arthritis cannot be excluded. Prepatellar soft tissue edema. 3. Small amount of soft tissue edema along the posterolateral aspect and medial aspect of the mid thigh which may be reactive versus secondary to mild cellulitis. No drainable fluid collection to suggest an abscess. Electronically Signed   By: Kathreen Devoid   On: 01/02/2019 19:51     Admission status: Inpatient: Based on patients clinical presentation and evaluation of above clinical data, I have made determination that patient meets Inpatient criteria at this time.   [facility] Pager [phone]. If 7PM-7AM, please  contact night-coverage at [email or web address], Office  [phone]  password [email or web address]  01/03/2019, 3:44 PM  LOS: 5 days

## 2019-01-03 NOTE — Progress Notes (Signed)
Physical Therapy Treatment Patient Details Name: [name] MRN: [id number] DOB: 11/03/1955 Today's Date: 01/03/2019    History of Present Illness Pt admitted with R knee pain and found to have R knee pre patellar bursa infection and is s/p I and D with debridement of necrotic bursa on 10/28.  He is on IV antibiotics.    PT Comments    Patient progressed well with therapy today and was able to ambulate ~260 feet with min guard using RW. He was slow to mobilize and gait remains antalgic with reduced weight shift to Rt LE. Patientt was educated about benefit of calf muscle pump during gait to reduce edema in the lower leg and pt initiated heel toe pattern to improve toe off for calf activation with gait. He will continue to benefit from skilled PT interventions to address impairments and progress functional mobility. Acute PT will continue to follow.   Follow Up Recommendations  Outpatient PT     Equipment Recommendations  Rolling walker with 5" wheels(RW)    Recommendations for Other Services       Precautions / Restrictions Precautions Precautions: Fall Restrictions Weight Bearing Restrictions: No    Mobility  Bed Mobility Overal bed mobility: Needs Assistance Bed Mobility: Supine to Sit     Supine to sit: Supervision     General bed mobility comments: pt able to sequence and mobilize Rt LE without assistance, no cues needed for safety  Transfers Overall transfer level: Needs assistance Equipment used: Rolling walker (2 wheeled) Transfers: Sit to/from Stand Sit to Stand: Min guard         General transfer comment: no cues needed for safe technique with RW and patient able to initiate power up without assist  Ambulation/Gait Ambulation/Gait assistance: Min guard Gait Distance (Feet): 260 Feet Assistive device: Rolling walker (2 wheeled) Gait Pattern/deviations: Step-to pattern;Decreased stride length;Decreased stance time - right;Decreased weight shift to  right;Antalgic Gait velocity: decreased   General Gait Details: pt reliant on UE support with RW to decrease WB in Rt LE to manage pain throughout gait, he imporved his step pattern with more of a heel toe pattern on Rt foot as he continued ambulating and was educated on benefits of teh calf muscle pump to reduce edema in the lower leg. no overt LOB noted during gait   Stairs             Wheelchair Mobility    Modified Rankin (Stroke Patients Only)       Balance Overall balance assessment: Needs assistance Sitting-balance support: No upper extremity supported;Feet supported Sitting balance-Leahy Scale: Good     Standing balance support: Bilateral upper extremity supported;During functional activity Standing balance-Leahy Scale: Fair             Cognition Arousal/Alertness: Awake/alert Behavior During Therapy: WFL for tasks assessed/performed Overall Cognitive Status: Within Functional Limits for tasks assessed           Exercises      General Comments        Pertinent Vitals/Pain Pain Assessment: Faces Faces Pain Scale: Hurts little more Pain Location: Rt foot/ankle Pain Descriptors / Indicators: Grimacing;Guarding Pain Intervention(s): Limited activity within patient's tolerance;Monitored during session;Repositioned           PT Goals (current goals can now be found in the care plan section) Acute Rehab PT Goals Patient Stated Goal: decrease pain, improve infection PT Goal Formulation: With patient Time For Goal Achievement: 01/13/19 Potential to Achieve Goals: Good Additional Goals Additional Goal #1:  Pt will be independent with HEP to improve knee ROM for gait and transfers Additional Goal #2: Pt will increase R knee flexion to 90 degrees for stairs and transfers. Progress towards PT goals: Progressing toward goals    Frequency    Min 6X/week      PT Plan Current plan remains appropriate       AM-PAC PT "6 Clicks" Mobility    Outcome Measure  Help needed turning from your back to your side while in a flat bed without using bedrails?: A Little Help needed moving from lying on your back to sitting on the side of a flat bed without using bedrails?: A Little Help needed moving to and from a bed to a chair (including a wheelchair)?: A Little Help needed standing up from a chair using your arms (e.g., wheelchair or bedside chair)?: A Little Help needed to walk in hospital room?: A Little Help needed climbing 3-5 steps with a railing? : A Lot 6 Click Score: 17    End of Session Equipment Utilized During Treatment: Gait belt Activity Tolerance: Patient tolerated treatment well Patient left: with call bell/phone within reach;in chair Nurse Communication: Mobility status PT Visit Diagnosis: Unsteadiness on feet (R26.81);Other abnormalities of gait and mobility (R26.89);Muscle weakness (generalized) (M62.81);Pain Pain - Right/Left: Right Pain - part of body: Knee     Time: 1227-1309 PT Time Calculation (min) (ACUTE ONLY): 42 min  Charges:  $Gait Training: 23-37 mins                     Kipp Brood, PT, DPT Physical Therapist with [facility]  01/03/2019 1:24 PM

## 2019-01-03 NOTE — Progress Notes (Signed)
Patient ID: [name], male   DOB: [birth year], 63 y.o.   MRN: [id number] Clinically the patient looks much better today.  I do believe that the steroid helped significantly and I would recommend another dose of steroid today.  He reports much less ankle and foot pain.  I did review the MRI of his right thigh that included the knee.  There is a knee joint effusion but I expect this to be the case.  I was able to aspirate the right knee when we were in the operating room and it just showed clear synovial fluid consistent with the arthritis that he does have in his knee joint on that side.  I would not recommend any further surgery right now based on his clinical exam.  Also his white blood cell count continues to come down and is 10.8.  Hopefully if he continues to respond this way he could potentially even be discharged to home tomorrow.  Again, I would recommend an additional dose of steroid today and check a CBC again tomorrow.

## 2019-01-04 LAB — BASIC METABOLIC PANEL
Anion gap: 10 (ref 5–15)
BUN: 22 mg/dL (ref 8–23)
CO2: 24 mmol/L (ref 22–32)
Calcium: 9.2 mg/dL (ref 8.9–10.3)
Chloride: 98 mmol/L (ref 98–111)
Creatinine, Ser: 0.99 mg/dL (ref 0.61–1.24)
GFR calc Af Amer: >60 mL/min (ref >60)
GFR calc non Af Amer: >60 mL/min (ref >60)
Glucose, Bld: 192 mg/dL — ABNORMAL HIGH (ref 70–99)
Potassium: 4.7 mmol/L (ref 3.5–5.1)
Sodium: 132 mmol/L — ABNORMAL LOW (ref 135–145)

## 2019-01-04 LAB — CBC
HCT: 39 % (ref 39.0–52.0)
Hemoglobin: 13 g/dL (ref 13.0–17.0)
MCH: 31 pg (ref 26.0–34.0)
MCHC: 33.3 g/dL (ref 30.0–36.0)
MCV: 93.1 fL (ref 80.0–100.0)
Platelets: 420 10*3/uL — ABNORMAL HIGH (ref 150–400)
RBC: 4.19 MIL/uL — ABNORMAL LOW (ref 4.22–5.81)
RDW: 11.3 % — ABNORMAL LOW (ref 11.5–15.5)
WBC: 14.1 10*3/uL — ABNORMAL HIGH (ref 4.0–10.5)
nRBC: 0 % (ref 0.0–0.2)

## 2019-01-04 MED ORDER — BISACODYL 10 MG RE SUPP
10.0000 mg | Freq: Once | RECTAL | Status: AC
  Administered 2019-01-04: 10 mg via RECTAL
  Filled 2019-01-04: qty 1

## 2019-01-04 MED ORDER — AMOXICILLIN-POT CLAVULANATE 875-125 MG PO TABS: 1.0000 | ORAL_TABLET | Freq: Two times a day (BID) | ORAL | 0 refills | Status: AC

## 2019-01-04 MED ORDER — BISACODYL 5 MG PO TBEC: 5.0000 mg | DELAYED_RELEASE_TABLET | Freq: Every day | ORAL | 0 refills | Status: DC | PRN

## 2019-01-04 MED ORDER — BISACODYL 5 MG PO TBEC
10.0000 mg | DELAYED_RELEASE_TABLET | Freq: Once | ORAL | Status: AC
  Administered 2019-01-04: 10 mg via ORAL
  Filled 2019-01-04: qty 2

## 2019-01-04 MED ORDER — OXYCODONE HCL 5 MG PO TABS: 5.0000 mg | ORAL_TABLET | ORAL | 0 refills | Status: DC | PRN

## 2019-01-04 MED ORDER — ONDANSETRON HCL 4 MG PO TABS: 4.0000 mg | ORAL_TABLET | Freq: Four times a day (QID) | ORAL | 0 refills | Status: DC | PRN

## 2019-01-04 MED ORDER — COLCHICINE 0.6 MG PO TABS: 0.6000 mg | ORAL_TABLET | Freq: Two times a day (BID) | ORAL | 1 refills | Status: DC

## 2019-01-04 MED ORDER — WITCH HAZEL-GLYCERIN EX PADS: MEDICATED_PAD | CUTANEOUS | 12 refills | Status: DC | PRN

## 2019-01-04 MED ORDER — HYDROCORTISONE ACETATE 1 % EX OINT: 28.4000 g | TOPICAL_OINTMENT | Freq: Two times a day (BID) | CUTANEOUS | 1 refills | Status: DC

## 2019-01-04 MED ORDER — POLYETHYLENE GLYCOL 3350 17 G PO PACK: 17.0000 g | PACK | Freq: Every day | ORAL | 0 refills | Status: DC

## 2019-01-04 MED ORDER — LACTULOSE 10 GM/15ML PO SOLN
20.0000 g | Freq: Once | ORAL | Status: AC
  Administered 2019-01-04: 20 g via ORAL
  Filled 2019-01-04: qty 30

## 2019-01-04 MED ORDER — WITCH HAZEL-GLYCERIN EX PADS
MEDICATED_PAD | CUTANEOUS | Status: DC | PRN
  Filled 2019-01-04: qty 100

## 2019-01-04 NOTE — Discharge Instructions (Signed)
Increase your activities as comfort allows. You can get your right knee dressing wet in the shower daily. You can change your dressing in 5 days to a new dressing.

## 2019-01-04 NOTE — Discharge Summary (Signed)
Physician Discharge Summary  Milburn Huseby [id number] DOB: May 29, 1955 DOA: 12/28/2018  PCP: Patient, No Pcp Per  Admit date: 12/28/2018 Discharge date: 01/04/2019  Admitted From: Home  disposition: Home Recommendations for Outpatient Follow-up:  1. Follow up with PCP in 1-2 weeks 2. Please obtain BMP/CBC in one week 3. Please follow up with Dr Ninfa Linden  Home Health: None Equipment/Devices none Discharge Condition: Stable and improved CODE STATUS full code  diet recommendation: Cardiac Brief/Interim Summary:63 year old male with history of arthritis, gout came with right knee pain.  He had swelling to the patellar region, pain redness and swelling then quickly spread.  Developed erythema about the entire right knee streaking up the leg.  He was subsequently seen by orthopedics and taken to the OR  for prepatellar abscess incision and drainage and washout   Discharge Diagnoses:  Principal Problem:   Cellulitis of right knee Active Problems:   Pre-diabetes   Infected prepatellar bursa, right   Prepatellar abscess     1. Status post incision drainage of infected prepatellar bursa-patient underwent incision and drainage of the infected prepatellar bursa on 10/28, gross purulence in the prepatellar bursa of the right knee was noted which was washed out by orthopedics.  Intraoperative culture is positive for group C streptococcus.  Patient was initially treated with vancomycin and cefazolin.  He will be discharged on Augmentin for 3 weeks. Dr. Renaee Munda discussed with ID who recommended transition to Augmentin for 3 weeks on discharge.    2. Persistent knee swelling-improved after he received 20 mg of Solu-Medrol IV .  Patient has persistent swelling of the right lower extremity, venous duplex of lower extremities was negative for DVT.  MRI of the right femur no osteomyelitis of the right femur large knee joint effusion which may be reactive but septic arthritis cannot be excluded.   Prepatellar soft tissue edema.  Small amount of soft tissue edema along the posterolateral aspect and medial aspect of the mid thigh which may be reactive versus secondary to mild cellulitis.  No drainable fluid collection to suggest an abscess.  He was treated with IV steroids for 2 days.   3   constipation patient had not have a bowel movement since admission to the hospital.  On the day of discharge he had bowel movements after giving him Dulcolax 10 mg p.o. Dulcolax 10 mg suppository and lactulose.   Estimated body mass index is 29.99 kg/m as calculated from the following:   Height as of this encounter: 5' 8.5" (1.74 m).   Weight as of this encounter: 90.8 kg.  Discharge Instructions  Discharge Instructions    Call MD for:  difficulty breathing, headache or visual disturbances   Complete by: As directed    Call MD for:  persistant nausea and vomiting   Complete by: As directed    Call MD for:  severe uncontrolled pain   Complete by: As directed    Diet - low sodium heart healthy   Complete by: As directed    Increase activity slowly   Complete by: As directed      Allergies as of 01/04/2019   No Known Allergies     Medication List    STOP taking these medications   sildenafil 20 MG tablet Commonly known as: REVATIO     TAKE these medications   amoxicillin-clavulanate 875-125 MG tablet Commonly known as: Augmentin Take 1 tablet by mouth 2 (two) times daily for 21 days.   bisacodyl 5 MG EC tablet Commonly known as:  DULCOLAX Take 1 tablet (5 mg total) by mouth daily as needed for moderate constipation.   colchicine 0.6 MG tablet Take 1 tablet (0.6 mg total) by mouth 2 (two) times daily.   multivitamin with minerals Tabs tablet Take 1 tablet by mouth daily.   ondansetron 4 MG tablet Commonly known as: ZOFRAN Take 1 tablet (4 mg total) by mouth every 6 (six) hours as needed for nausea.   oxyCODONE 5 MG immediate release tablet Commonly known as: Oxy  IR/ROXICODONE Take 1-2 tablets (5-10 mg total) by mouth every 4 (four) hours as needed for severe pain.   polyethylene glycol 17 g packet Commonly known as: MIRALAX / GLYCOLAX Take 17 g by mouth daily.      Follow-up Information    Mcarthur Rossetti, MD. Schedule an appointment as soon as possible for a visit in 1 week(s).   Specialty: Orthopedic Surgery Why: Our new office address is [address] Next door to our old office Contact information: [place] [address] [phone]          No Known Allergies  Consultations: Ortho  Procedures/Studies: Mr Femur Right W Wo Contrast  Result Date: 01/02/2019 CLINICAL DATA:  I and D of the right prepatellar bursa on 10/28. Right lateral thigh pain. EXAM: MRI OF THE RIGHT FEMUR WITHOUT AND WITH CONTRAST TECHNIQUE: Multiplanar, multisequence MR imaging of the right femur was performed both before and after administration of intravenous contrast. CONTRAST:  73mL GADAVIST GADOBUTROL 1 MMOL/ML IV SOLN COMPARISON:  None. FINDINGS: Bones/Joint/Cartilage No marrow signal abnormality. No fracture or dislocation. Normal alignment. No periosteal reaction or bone destruction. No aggressive osseous lesion. No hip joint effusion. Large knee joint effusion.  Mild prepatellar soft tissue edema. Ligaments Collateral ligaments are intact.  Lisfranc ligament is intact. Muscles and Tendons No muscle edema. Muscles are normal. No intramuscular fluid collection or hematoma. Soft tissue Small amount of soft tissue edema along the posterolateral aspect and medial aspect of the mid thigh which may be reactive versus secondary to mild cellulitis. No drainable fluid collection. No soft tissue mass. IMPRESSION: 1. No osteomyelitis of the right femur. 2. Large knee joint effusion which may be reactive, but septic arthritis cannot be excluded. Prepatellar soft tissue edema. 3. Small amount of soft tissue edema along  the posterolateral aspect and medial aspect of the mid thigh which may be reactive versus secondary to mild cellulitis. No drainable fluid collection to suggest an abscess. Electronically Signed   By: Kathreen Devoid   On: 01/02/2019 19:51   Dg Chest Port 1 View  Result Date: 12/31/2018 CLINICAL DATA:  Fever EXAM: PORTABLE CHEST 1 VIEW COMPARISON:  Portable exam 2132 hours compared to 01/23/2007 FINDINGS: Normal heart size, mediastinal contours, and pulmonary vascularity. Opacities at the LEFT base and in RIGHT upper lobe, somewhat linear, could represent atelectasis or infiltrate. Remaining lungs clear. No pleural effusion or pneumothorax. Osseous structures unremarkable. IMPRESSION: Somewhat linear opacities in the RIGHT upper lobe and at LEFT lung base, question atelectasis or infiltrate. Electronically Signed   By: Lavonia Dana M.D.   On: 12/31/2018 21:57   Dg Knee Complete 4 Views Right  Result Date: 12/28/2018 CLINICAL DATA:  Knee pain and swelling, no known injury, initial encounter EXAM: RIGHT KNEE - COMPLETE 4+ VIEW COMPARISON:  None. FINDINGS: Tricompartmental degenerative changes are noted. No joint effusion is seen. Soft tissue swelling is noted over the patella. No acute fracture is seen. Meniscal calcifications are noted. IMPRESSION:  Chronic changes without acute abnormality. Overlying soft tissue swelling is seen. Electronically Signed   By: Inez Catalina M.D.   On: 12/28/2018 01:55   Vas [name] Lower Extremity Venous (dvt)  Result Date: 01/01/2019  Lower Venous Study Indications: Pain.  Risk Factors: Surgery. Limitations: Poor ultrasound/tissue interface, bandages and patient immobility. Comparison Study: No prior studies. Performing Technologist: Oliver Hum RVT  Examination Guidelines: A complete evaluation includes B-mode imaging, spectral Doppler, color Doppler, and power Doppler as needed of all accessible portions of each vessel. Bilateral testing is considered an integral part of a  complete examination. Limited examinations for reoccurring indications may be performed as noted.  +---------+---------------+---------+-----------+----------+--------------+ RIGHT    CompressibilityPhasicitySpontaneityPropertiesThrombus Aging +---------+---------------+---------+-----------+----------+--------------+ CFV      Full           Yes      Yes                                 +---------+---------------+---------+-----------+----------+--------------+ SFJ      Full                                                        +---------+---------------+---------+-----------+----------+--------------+ FV Prox  Full                                                        +---------+---------------+---------+-----------+----------+--------------+ FV Mid   Full                                                        +---------+---------------+---------+-----------+----------+--------------+ FV DistalFull                                                        +---------+---------------+---------+-----------+----------+--------------+ PFV      Full                                                        +---------+---------------+---------+-----------+----------+--------------+ POP      Full           Yes      Yes                                 +---------+---------------+---------+-----------+----------+--------------+ PTV      Full                                                        +---------+---------------+---------+-----------+----------+--------------+  PERO     Full                                                        +---------+---------------+---------+-----------+----------+--------------+   +----+---------------+---------+-----------+----------+--------------+ LEFTCompressibilityPhasicitySpontaneityPropertiesThrombus Aging +----+---------------+---------+-----------+----------+--------------+ CFV Full           Yes      Yes                                  +----+---------------+---------+-----------+----------+--------------+     Summary: Right: There is no evidence of deep vein thrombosis in the lower extremity. No cystic structure found in the popliteal fossa. Left: No evidence of common femoral vein obstruction.  *See table(s) above for measurements and observations. Electronically signed by Deitra Mayo MD on 01/01/2019 at 2:44:33 PM.    Final     (Echo, Carotid, EGD, Colonoscopy, ERCP)    Subjective:  Patient is resting in bed reports he has not had a bowel movement since he came in.  The right leg pain seems to have improved after steroids.  He is ambulating to the restroom by himself. Discharge Exam: Vitals:   01/03/19 2200 01/04/19 0530  BP: 140/72 139/70  Pulse: 80 66  Resp: 20 17  Temp: 98.9 F (37.2 C) 98.9 F (37.2 C)  SpO2: 96% 97%   Vitals:   01/03/19 1459 01/03/19 2009 01/03/19 2200 01/04/19 0530  BP: 140/76  140/72 139/70  Pulse: 77 78 80 66  Resp: 18 18 20 17   Temp: 98.5 F (36.9 C)  98.9 F (37.2 C) 98.9 F (37.2 C)  TempSrc: Oral  Oral Oral  SpO2: 99%  96% 97%  Weight:      Height:        General: Pt is alert, awake, not in acute distress Cardiovascular: RRR, S1/S2 +, no rubs, no gallops Respiratory: CTA bilaterally, no wheezing, no rhonchi Abdominal: Soft, NT, ND, bowel sounds + Extremities: no edema, no cyanosis    The results of significant diagnostics from this hospitalization (including imaging, microbiology, ancillary and laboratory) are listed below for reference.     Microbiology: Recent Results (from the past 240 hour(s))  SARS CORONAVIRUS 2 (TAT 6-24 HRS) Nasopharyngeal Nasopharyngeal Swab     Status: None   Collection Time: 12/28/18  4:53 AM   Specimen: Nasopharyngeal Swab  Result Value Ref Range Status   SARS Coronavirus 2 NEGATIVE NEGATIVE Final    Comment: (NOTE) SARS-CoV-2 target nucleic acids are NOT DETECTED. The SARS-CoV-2 RNA is generally  detectable in upper and lower respiratory specimens during the acute phase of infection. Negative results do not preclude SARS-CoV-2 infection, do not rule out co-infections with other pathogens, and should not be used as the sole basis for treatment or other patient management decisions. Negative results must be combined with clinical observations, patient history, and epidemiological information. The expected result is Negative. Fact Sheet for Patients: SugarRoll.be Fact Sheet for Healthcare Providers: https://www.woods-Keslie Gritz.com/ This test is not yet approved or cleared by the [place] FDA and  has been authorized for detection and/or diagnosis of SARS-CoV-2 by FDA under an Emergency Use Authorization (EUA). This EUA will remain  in effect (meaning this test can be used) for the duration of the COVID-19 declaration under Section 56 4(b)(1) of the Act,  21 U.S.C. section 360bbb-3(b)(1), unless the authorization is terminated or revoked sooner. Performed at [facility], [place] [address] [place], [place] [address]   Body fluid culture     Status: None   Collection Time: 12/28/18 12:14 PM   Specimen: KNEE; Body Fluid  Result Value Ref Range Status   Specimen Description   Final    KNEE Performed at [facility] [address], [place], [place] [address]    Special Requests   Final    Normal Performed at [facility], [place] [address], [place], [place] [address]    Gram Stain   Final    ABUNDANT WBC PRESENT, PREDOMINANTLY PMN MODERATE GRAM POSITIVE COCCI IN PAIRS IN CLUSTERS Performed at [facility], [place] [address], [place], [place] [address]    Culture ABUNDANT STREPTOCOCCUS GROUP C  Final   Report Status 12/30/2018 FINAL  Final  Culture, blood (routine x 2)     Status: None (Preliminary result)   Collection Time: 12/31/18 10:13 PM   Specimen: BLOOD  Result Value Ref Range  Status   Specimen Description   Final    BLOOD RIGHT ANTECUBITAL Performed at [facility], [place] [address], [place], [place] [address]    Special Requests   Final    BOTTLES DRAWN AEROBIC AND ANAEROBIC Blood Culture adequate volume Performed at [facility] [address] [place], [place] [address]    Culture   Final    NO GROWTH 2 DAYS Performed at [facility] [address] [place], [place] [address]    Report Status PENDING  Incomplete  Culture, blood (routine x 2)     Status: None (Preliminary result)   Collection Time: 12/31/18 10:13 PM   Specimen: BLOOD  Result Value Ref Range Status   Specimen Description   Final    BLOOD LEFT ARM Performed at [facility] [address], [place], [place] [address]    Special Requests   Final    BOTTLES DRAWN AEROBIC AND ANAEROBIC Blood Culture adequate volume Performed at [facility] [address], [place], [place] [address]    Culture   Final    NO GROWTH 2 DAYS Performed at [facility] [address] [place],  [address]    Report Status PENDING  Incomplete     Labs: BNP (last 3 results) No results for input(s): BNP in the last 8760 hours. Basic Metabolic Panel: Recent Labs  Lab 12/29/18 0546 12/30/18 0521 12/31/18 0539 01/01/19 0536 01/02/19 0533 01/03/19 0523 01/04/19 0506  NA 131* 134*  --  135  --  131* 132*  K 4.2 4.1  --  4.3  --  5.2* 4.7  CL 102 103  --  102  --  98 98  CO2 21* 23  --  22  --  23 24  GLUCOSE 211* 125*  --  153*  --  231* 192*  BUN 16 17  --  15  --  22 22  CREATININE 1.15 1.12 1.08 1.02 0.84 1.23 0.99  CALCIUM 8.4* 8.4*  --  8.8*  --  9.1 9.2   Liver Function Tests: Recent Labs  Lab 01/01/19 0536  AST 36  ALT 50*  ALKPHOS 78  BILITOT 1.0  PROT 7.4  ALBUMIN 3.3*   No results for input(s): LIPASE, AMYLASE in the last 168 hours. No results for input(s): AMMONIA in the last  168  hours. CBC: Recent Labs  Lab 12/31/18 0539 01/01/19 0536 01/02/19 0533 01/03/19 0523 01/04/19 0506  WBC 11.1* 13.3* 11.9* 10.8* 14.1*  HGB 11.9* 12.4* 12.5* 13.3 13.0  HCT 36.0* 37.2* 37.6* 39.8 39.0  MCV 95.5 94.4 94.7 93.4 93.1  PLT 265 287 310 374 420*   Cardiac Enzymes: No results for input(s): CKTOTAL, CKMB, CKMBINDEX, TROPONINI in the last 168 hours. BNP: Invalid input(s): POCBNP CBG: No results for input(s): GLUCAP in the last 168 hours. D-Dimer No results for input(s): DDIMER in the last 72 hours. Hgb A1c No results for input(s): HGBA1C in the last 72 hours. Lipid Profile No results for input(s): CHOL, HDL, LDLCALC, TRIG, CHOLHDL, LDLDIRECT in the last 72 hours. Thyroid function studies No results for input(s): TSH, T4TOTAL, T3FREE, THYROIDAB in the last 72 hours.  Invalid input(s): FREET3 Anemia work up No results for input(s): VITAMINB12, FOLATE, FERRITIN, TIBC, IRON, RETICCTPCT in the last 72 hours. Urinalysis    Component Value Date/Time   COLORURINE YELLOW 12/31/2018 2129   APPEARANCEUR CLEAR 12/31/2018 2129   LABSPEC 1.027 12/31/2018 2129   PHURINE 5.0 12/31/2018 2129   GLUCOSEU NEGATIVE 12/31/2018 2129   HGBUR NEGATIVE 12/31/2018 2129   BILIRUBINUR NEGATIVE 12/31/2018 2129   KETONESUR NEGATIVE 12/31/2018 2129   PROTEINUR NEGATIVE 12/31/2018 2129   UROBILINOGEN 0.2 01/23/2007 0502   NITRITE NEGATIVE 12/31/2018 2129   LEUKOCYTESUR NEGATIVE 12/31/2018 2129   Sepsis Labs Invalid input(s): PROCALCITONIN,  WBC,  LACTICIDVEN Microbiology Recent Results (from the past 240 hour(s))  SARS CORONAVIRUS 2 (TAT 6-24 HRS) Nasopharyngeal Nasopharyngeal Swab     Status: None   Collection Time: 12/28/18  4:53 AM   Specimen: Nasopharyngeal Swab  Result Value Ref Range Status   SARS Coronavirus 2 NEGATIVE NEGATIVE Final    Comment: (NOTE) SARS-CoV-2 target nucleic acids are NOT DETECTED. The SARS-CoV-2 RNA is generally detectable in upper and lower respiratory  specimens during the acute phase of infection. Negative results do not preclude SARS-CoV-2 infection, do not rule out co-infections with other pathogens, and should not be used as the sole basis for treatment or other patient management decisions. Negative results must be combined with clinical observations, patient history, and epidemiological information. The expected result is Negative. Fact Sheet for Patients: SugarRoll.be Fact Sheet for Healthcare Providers: https://www.woods-Madalynne Gutmann.com/ This test is not yet approved or cleared by the [place] FDA and  has been authorized for detection and/or diagnosis of SARS-CoV-2 by FDA under an Emergency Use Authorization (EUA). This EUA will remain  in effect (meaning this test can be used) for the duration of the COVID-19 declaration under Section 56 4(b)(1) of the Act, 21 U.S.C. section 360bbb-3(b)(1), unless the authorization is terminated or revoked sooner. Performed at [facility], [place] [address] [place], [place] [address]   Body fluid culture     Status: None   Collection Time: 12/28/18 12:14 PM   Specimen: KNEE; Body Fluid  Result Value Ref Range Status   Specimen Description   Final    KNEE Performed at [facility] [address] [place], [place] [address]    Special Requests   Final    Normal Performed at [facility], [place] [address] [place], [place] [address]    Gram Stain   Final    ABUNDANT WBC PRESENT, PREDOMINANTLY PMN MODERATE GRAM POSITIVE COCCI IN PAIRS IN CLUSTERS Performed at [facility], [place] [address] [place], [place] [address]    Culture ABUNDANT  STREPTOCOCCUS GROUP C  Final   Report Status 12/30/2018 FINAL  Final  Culture, blood (routine x 2)     Status: None (Preliminary result)   Collection Time: 12/31/18 10:13 PM   Specimen: BLOOD  Result Value Ref Range Status   Specimen Description   Final    BLOOD  RIGHT ANTECUBITAL Performed at [facility], [place] [address] [place], [place] [address]    Special Requests   Final    BOTTLES DRAWN AEROBIC AND ANAEROBIC Blood Culture adequate volume Performed at [facility] [address], [place], [place] [address]    Culture   Final    NO GROWTH 2 DAYS Performed at [facility] [address], [place], [place] [address]    Report Status PENDING  Incomplete  Culture, blood (routine x 2)     Status: None (Preliminary result)   Collection Time: 12/31/18 10:13 PM   Specimen: BLOOD  Result Value Ref Range Status   Specimen Description   Final    BLOOD LEFT ARM Performed at [facility] [address] [place], [place] [address]    Special Requests   Final    BOTTLES DRAWN AEROBIC AND ANAEROBIC Blood Culture adequate volume Performed at [facility] [address] [place], [place] [address]    Culture   Final    NO GROWTH 2 DAYS Performed at [facility] [address] [place], [place] [address]    Report Status PENDING  Incomplete     Time coordinating discharge: 38 minutes  SIGNED:   Georgette Shell, MD  [facility] 01/04/2019, 11:34 AM Pager   If 7PM-7AM, please contact night-coverage [email or web address] Password [email or web address]

## 2019-01-04 NOTE — Plan of Care (Signed)
Plan of care reviewed and discussed with the patient. 

## 2019-01-04 NOTE — Progress Notes (Signed)
Patient ID: [name], male   DOB: 1955-12-31, 63 y.o.   MRN: [id number] Clinically, he looks great today.  His right ankle pain and swelling has almost completely resolved.  His right knee is also stable.  I am fine with discharge to home today on oral antibiotics.  His WBC has gone up, but he otherwise looks good.

## 2019-01-06 LAB — CULTURE, BLOOD (ROUTINE X 2)
Culture: NO GROWTH
Culture: NO GROWTH
Special Requests: ADEQUATE
Special Requests: ADEQUATE

## 2019-01-07 NOTE — Progress Notes (Signed)
CM received call from Dr Jacki Cones stating [name] has been calling the unit from which he discharged earlier in the week and stating that he cannot afford the colchicine he was prescribed at discharge. CM spoke with patient over the telephone and [name] reports he was told by his pharmacy that the colchicine is not covered by his insurance and will cost him 400$ which he cannot afford. CM called his pharmacy who confirms this, CM requested that pharmacy run the brand name drug, which did end up being covered by his insurance but came at a cost of 100$ for the copay. Goodrx coupon gives a price of 127$.  patient cannot afford this medication. CM spoke with MD to see if there was any there medications that could be prescribed but unfortunately there are none.  Cm explained this to patient and in the course of discussion found that patient does not have a pcp either. CM discussed the services at [facility] and provided patient with contact number to call first thing Monday to establish care and speak with intake to see if he can use the in-house pharmacy at the center.  With patient permission, a message was sent to Vicki Mallet to reach out to the patient and see if the pharmacy at [facility] can assist with the cost of his prescriptions.  Patient expresses that he will call Monday morning and establish care with [facility] and inquire about applying for their patient assistance program.

## 2019-01-11 ENCOUNTER — Encounter: Payer: Self-pay | Admitting: Orthopaedic Surgery

## 2019-01-11 ENCOUNTER — Other Ambulatory Visit: Payer: Self-pay

## 2019-01-11 ENCOUNTER — Ambulatory Visit (INDEPENDENT_AMBULATORY_CARE_PROVIDER_SITE_OTHER): Payer: Self-pay | Admitting: Orthopaedic Surgery

## 2019-01-11 DIAGNOSIS — M71161 Other infective bursitis, right knee: Secondary | ICD-10-CM

## 2019-01-11 NOTE — Progress Notes (Signed)
HPI: [name] returns today follow-up irrigation debridement right prepatellar infection.  He did grow out abundant strep C.  He is on Augmentin.  He states he has had no fevers but definitely has chills.  Blood cultures are negative x5 days.  Physical exam right knee no abnormal warmth erythema.  Slight edema but no effusion.  Sutures well approximate the incision.  There is no sign of drainage.  Calf supple nontender.  He has tenderness about the right ankle.  Impression: Status post irrigation debridement right knee 12/28/2018  Plan: We will take his sutures out in a week.  He will continue his Augmentin.  Elevation of the leg encouraged.  Ice to the knees encouraged.  Questions encouraged and answered.  Follow-up with [name] in 1 week sooner if there is any signs of infection or questions.

## 2019-01-12 ENCOUNTER — Telehealth: Payer: Self-pay

## 2019-01-12 NOTE — Telephone Encounter (Signed)
Message received from Nancy Marus, RN CM requesting a hospital follow up appointment for patient.  Informed her that patient has an appt scheduled for 01/19/2019 @ [facility]

## 2019-01-17 ENCOUNTER — Encounter: Payer: Self-pay | Admitting: Family Medicine

## 2019-01-17 ENCOUNTER — Ambulatory Visit: Payer: Self-pay

## 2019-01-17 ENCOUNTER — Other Ambulatory Visit: Payer: Self-pay

## 2019-01-17 ENCOUNTER — Ambulatory Visit (INDEPENDENT_AMBULATORY_CARE_PROVIDER_SITE_OTHER): Payer: Self-pay | Admitting: Family Medicine

## 2019-01-17 DIAGNOSIS — M25571 Pain in right ankle and joints of right foot: Secondary | ICD-10-CM

## 2019-01-17 MED ORDER — PREDNISONE 10 MG PO TABS: ORAL_TABLET | ORAL | 0 refills | Status: DC

## 2019-01-17 MED ORDER — COLCHICINE 0.6 MG PO CAPS: 1.0000 | ORAL_CAPSULE | Freq: Two times a day (BID) | ORAL | 3 refills | Status: DC | PRN

## 2019-01-17 NOTE — Progress Notes (Signed)
[name] - 63 y.o. male MRN [id number]  Date of birth: 1955-12-04  Office Visit Note: Visit Date: 01/17/2019 PCP: Patient, No Pcp Per Referred by: Eunice Blase, MD  Subjective: Chief Complaint  Patient presents with  . Right Ankle - Pain    Severe pain in lateral ankle since 12/14/18 (had actually started hurting earlier, while in the hospital for knee surgery). Ankle throbs. Hurts to bear full weight on the foot. Ambulating with rollator.   HPI: [name] is a 63 y.o. male who comes in today with right ankle pain for the past 3 weeks.   HE was admitted from 10/27-10/28 for prepatellar bursa infection, with I&D with Dr. Ninfa Linden. During the hospitalization, he developed pain in right toe and right ankle. Pain improved slightly with IV solumedrol but returned when he left the hospital. He was discharged with colchicine for possible gout but was unable to pick it up as it was $300. He has been taking ibuprofen with no relief. Currently using a scooter and having difficulty bearing weight on right foot.   Had gout in L 1st toe in 2011.   ROS Otherwise per HPI.  Assessment & Plan: Visit Diagnoses:  1. Pain in right ankle and joints of right foot     Plan: History, PE, and US findings consistent with gout in right ankle joint. Will provide steroid taper as well as alternative colchicine prescription that will hopefully be more afforable. Return if no improvement and will proceed with corticosteroid injection. Will see Dr. Ninfa Linden tomorrow for knee follow up.   Meds & Orders:  Meds ordered this encounter  Medications  . predniSONE (DELTASONE) 10 MG tablet    Sig: Take as directed for 12 days.  Daily dose 6,6,5,5,4,4,3,3,2,2,1,1.    Dispense:  42 tablet    Refill:  0  . Colchicine (MITIGARE) 0.6 MG CAPS    Sig: Take 1 capsule by mouth 2 (two) times daily as needed.    Dispense:  60 capsule    Refill:  3    Please provide alternative brand if there's something his  insurance will cover at a reasonable copay.    Orders Placed This Encounter  Procedures  . MSK [name] - NO CHARGES    Follow-up: PRN  Procedures: No procedures performed  No notes on file   Clinical History: No specialty comments available.   He reports that he has never smoked. He has never used smokeless tobacco.  Recent Labs    12/28/18 0101 12/30/18 0521 12/31/18 0539  HGBA1C  --  5.7*  --   LABURIC 7.5  --  5.6    Objective:  VS:  HT:    WT:   BMI:     BP:   HR: bpm  TEMP: ( )  RESP:  Physical Exam  PHYSICAL EXAM: Gen: NAD, alert, cooperative with exam, well-appearing HEENT: clear conjunctiva,  CV:  no edema, capillary refill brisk, normal rate Resp: non-labored Skin: no rashes, normal turgor  Neuro: no gross deficits.  Psych:  alert and oriented  Ortho Exam  Right Ankle: - Inspection: swollen ankle joint - Palpation: warmth noted over ankle, TTP over anterior and lateral ankle - Strength: Strength tested limited by pain - ROM: Limited ROM - Neuro/vasc: NV intact   Imaging: [facility] [name] - No Charges  Result Date: 01/17/2019 Please see Notes tab for imaging impression.  [name] right ankle: double contour sign on talus Past Medical/Family/Surgical/Social History: Medications & Allergies reviewed per EMR, new  medications updated. Patient Active Problem List   Diagnosis Date Noted  . Prepatellar abscess 12/29/2018  . Cellulitis of right knee 12/28/2018  . Infected prepatellar bursa, right   . Pre-diabetes 07/08/2016  . Elevated blood pressure reading without diagnosis of hypertension 06/01/2016  . Dental caries 05/21/2015  . Thrombocytosis ([place]) 04/24/2015  . Erectile dysfunction 04/23/2015  . Healthcare maintenance 03/08/2015  . Polyarthritis 03/08/2015  . ROTATOR CUFF INJURY, LEFT SHOULDER 06/20/2009   Past Medical History:  Diagnosis Date  . Diabetes mellitus without complication ([place])    pt states no  . Erectile dysfunction   . Polyarthritis   .  Pre-diabetes    History reviewed. No pertinent family history. Past Surgical History:  Procedure Laterality Date  . IRRIGATION AND DEBRIDEMENT KNEE Right 12/28/2018   Procedure: IRRIGATION AND DEBRIDEMENT KNEE;  Surgeon: Mcarthur Rossetti, MD;  Location: [facility];  Service: Orthopedics;  Laterality: Right;  . KNEE SURGERY    . ROTATOR CUFF REPAIR     Social History   Occupational History  . Not on file  Tobacco Use  . Smoking status: Never Smoker  . Smokeless tobacco: Never Used  Substance and Sexual Activity  . Alcohol use: No    Alcohol/week: 0.0 standard drinks  . Drug use: No  . Sexual activity: Not on file

## 2019-01-17 NOTE — Progress Notes (Signed)
I saw and examined the patient with Dr. Mayer Masker and agree with assessment and plan as outlined.    Right ankle pain with synovitis and double-contour sign on ultrasound, very suspicious for gout.  Question whether this could have contributed to recent right knee issues as well.  Will treat with prednisone, colchicine.  Consider injection if persists.  Follow up with Dr. Ninfa Linden tomorrow for knee.

## 2019-01-18 ENCOUNTER — Ambulatory Visit (INDEPENDENT_AMBULATORY_CARE_PROVIDER_SITE_OTHER): Payer: Self-pay | Admitting: Orthopaedic Surgery

## 2019-01-18 ENCOUNTER — Telehealth: Payer: Self-pay

## 2019-01-18 ENCOUNTER — Encounter: Payer: Self-pay | Admitting: Orthopaedic Surgery

## 2019-01-18 DIAGNOSIS — M71161 Other infective bursitis, right knee: Secondary | ICD-10-CM

## 2019-01-18 NOTE — Telephone Encounter (Signed)

## 2019-01-18 NOTE — Progress Notes (Signed)
HPI: [name] returns today follow-up of his right knee prepatella infection.  He is overall doing well in regards to the knee.  Still dealing with a gouty flare of the ankle.  Saw Dr. Junius Roads yesterday and was placed on prednisone.  Also he is trying to obtain colchicine but however cost is been a barrier thus far.  He continues to take Augmentin for the prepatellar infection.  Said no fevers chills.  Physical exam: Right knee sutures well approximate the incision.  Incision.  Well-healed.  No signs of infection or dehiscence.  No abnormal warmth erythema or effusion of the right knee.  Overall good range of motion right knee without significant pain.  Impression: Status post irrigation debridement right knee 12/28/2018  Plan: He will finish his Augmentin.  He has an appointment with another physician tomorrow possibly to obtain colchicine.  Like for him to finish the prednisone as written by Dr. Junius Roads.  Follow-up with [name] in 1 month sooner if there is any questions concerns or signs of infection.  Questions were encouraged and answered

## 2019-01-19 ENCOUNTER — Ambulatory Visit (INDEPENDENT_AMBULATORY_CARE_PROVIDER_SITE_OTHER): Payer: Self-pay | Admitting: Family Medicine

## 2019-01-19 ENCOUNTER — Other Ambulatory Visit: Payer: Self-pay

## 2019-01-19 ENCOUNTER — Encounter: Payer: Self-pay | Admitting: Family Medicine

## 2019-01-19 ENCOUNTER — Telehealth: Payer: Self-pay | Admitting: General Practice

## 2019-01-19 VITALS — BP 152/78 | HR 76 | Temp 97.7°F | Resp 17 | Ht 67.0 in | Wt 192.0 lb

## 2019-01-19 DIAGNOSIS — L02415 Cutaneous abscess of right lower limb: Secondary | ICD-10-CM

## 2019-01-19 DIAGNOSIS — I10 Essential (primary) hypertension: Secondary | ICD-10-CM

## 2019-01-19 DIAGNOSIS — L02419 Cutaneous abscess of limb, unspecified: Secondary | ICD-10-CM

## 2019-01-19 DIAGNOSIS — M10071 Idiopathic gout, right ankle and foot: Secondary | ICD-10-CM

## 2019-01-19 MED ORDER — AMLODIPINE BESYLATE 5 MG PO TABS: 5.0000 mg | ORAL_TABLET | Freq: Every day | ORAL | 1 refills | Status: DC

## 2019-01-19 MED ORDER — COLCHICINE 0.6 MG PO CAPS: ORAL_CAPSULE | ORAL | 3 refills | Status: DC

## 2019-01-19 NOTE — Progress Notes (Signed)
Subjective:  Patient ID: [name], male    DOB: 1955/03/10  Age: 63 y.o. MRN: [id number]  CC: Establish Care and Hospitalization Follow-up   HPI [name] is a 63 year old male seen today to establish care after previously followed by Internal medicine, last visit in 2018. He was hospitalized at [facility] for  prepatella bursitis s/p irrigation and debridement of right knee. Placed on Augmentin for a course of 3 weeks.  After discharge he had a hospital follow up with Orthopedic two days ago and he ambulates with a walker. He complains of right ankle pain which started in his right toe during his hospitalization and pain is described as throbbing. Symptoms are similar to what he experienced with a Gout flare in 2011. He received a Prednisone taper from his Orthopedic after ultrasound of the ankle findings was in keeping with Gout. This has provided some improvement but he has been unable to obtain Colchicine due to cost.  His blood pressure is elevated and he is currently not on antihypertensives. Past Medical History:  Diagnosis Date  . Diabetes mellitus without complication ([place])    pt states no  . Erectile dysfunction   . Polyarthritis   . Pre-diabetes     Past Surgical History:  Procedure Laterality Date  . IRRIGATION AND DEBRIDEMENT KNEE Right 12/28/2018   Procedure: IRRIGATION AND DEBRIDEMENT KNEE;  Surgeon: Mcarthur Rossetti, MD;  Location: [facility];  Service: Orthopedics;  Laterality: Right;  . KNEE SURGERY    . ROTATOR CUFF REPAIR      History reviewed. No pertinent family history.  No Known Allergies  Outpatient Medications Prior to Visit  Medication Sig Dispense Refill  . amoxicillin-clavulanate (AUGMENTIN) 875-125 MG tablet Take 1 tablet by mouth 2 (two) times daily for 21 days. 42 tablet 0  . Hydrocortisone Acetate 1 % OINT Apply 28.4 g topically 2 (two) times daily. 28.4 g 1  . Multiple Vitamin (MULTIVITAMIN WITH MINERALS) TABS tablet  Take 1 tablet by mouth daily.    . predniSONE (DELTASONE) 10 MG tablet Take as directed for 12 days.  Daily dose 6,6,5,5,4,4,3,3,2,2,1,1. 42 tablet 0  . bisacodyl (DULCOLAX) 5 MG EC tablet Take 1 tablet (5 mg total) by mouth daily as needed for moderate constipation. 30 tablet 0  . Colchicine (MITIGARE) 0.6 MG CAPS Take 1 capsule by mouth 2 (two) times daily as needed. (Patient not taking: Reported on 01/19/2019) 60 capsule 3  . ondansetron (ZOFRAN) 4 MG tablet Take 1 tablet (4 mg total) by mouth every 6 (six) hours as needed for nausea. 20 tablet 0  . oxyCODONE (OXY IR/ROXICODONE) 5 MG immediate release tablet Take 1-2 tablets (5-10 mg total) by mouth every 4 (four) hours as needed for severe pain. 30 tablet 0  . polyethylene glycol (MIRALAX / GLYCOLAX) 17 g packet Take 17 g by mouth daily. 14 each 0  . witch hazel-glycerin (TUCKS) pad Apply topically as needed for itching. 40 each 12   No facility-administered medications prior to visit.      ROS Review of Systems  Constitutional: Negative for activity change and appetite change.  HENT: Negative for sinus pressure and sore throat.   Eyes: Negative for visual disturbance.  Respiratory: Negative for cough, chest tightness and shortness of breath.   Cardiovascular: Negative for chest pain and leg swelling.  Gastrointestinal: Negative for abdominal distention, abdominal pain, constipation and diarrhea.  Endocrine: Negative.   Genitourinary: Negative for dysuria.  Musculoskeletal:  See HPI  Skin: Negative for rash.  Allergic/Immunologic: Negative.   Neurological: Negative for weakness, light-headedness and numbness.  Psychiatric/Behavioral: Negative for dysphoric mood and suicidal ideas.    Objective:  BP (!) 152/78   Pulse 76   Temp 97.7 F (36.5 C) (Temporal)   Resp 17   Ht 5\' 7"  (1.702 m)   Wt 192 lb (87.1 kg)   SpO2 95%   BMI 30.07 kg/m   BP/Weight 01/19/2019 01/04/2019 [id number]  Systolic BP 0000000 [id number] -  Diastolic BP  78 55 -  Wt. (Lbs) 192 - 200.18  BMI 30.07 - 29.99      Physical Exam Constitutional:      Appearance: He is well-developed.  Neck:     Vascular: No JVD.  Cardiovascular:     Rate and Rhythm: Normal rate.     Heart sounds: Normal heart sounds. No murmur.  Pulmonary:     Effort: Pulmonary effort is normal.     Breath sounds: Normal breath sounds. No wheezing or rales.  Chest:     Chest wall: No tenderness.  Abdominal:     General: Bowel sounds are normal. There is no distension.     Palpations: Abdomen is soft. There is no mass.     Tenderness: There is no abdominal tenderness.  Musculoskeletal: Normal range of motion.     Right lower leg: No edema.     Left lower leg: No edema.     Comments: Normal appearance of right big toe and right ankle Slight TTP of dorsum of R big toe and lateral malleolus  Neurological:     Mental Status: He is alert and oriented to person, place, and time.  Psychiatric:        Mood and Affect: Mood normal.     CMP Latest Ref Rng & Units 01/04/2019 01/03/2019 01/02/2019  Glucose 70 - 99 mg/dL 192(H) 231(H) -  BUN 8 - 23 mg/dL 22 22 -  Creatinine 0.61 - 1.24 mg/dL 0.99 1.23 0.84  Sodium 135 - 145 mmol/L 132(L) 131(L) -  Potassium 3.5 - 5.1 mmol/L 4.7 5.2(H) -  Chloride 98 - 111 mmol/L 98 98 -  CO2 22 - 32 mmol/L 24 23 -  Calcium 8.9 - 10.3 mg/dL 9.2 9.1 -  Total Protein 6.5 - 8.1 g/dL - - -  Total Bilirubin 0.3 - 1.2 mg/dL - - -  Alkaline Phos 38 - 126 U/L - - -  AST 15 - 41 U/L - - -  ALT 0 - 44 U/L - - -     CBC    Component Value Date/Time   WBC 14.1 (H) 01/04/2019 0506   RBC 4.19 (L) 01/04/2019 0506   HGB 13.0 01/04/2019 0506   HGB 12.8 04/23/2015 1542   HCT 39.0 01/04/2019 0506   HCT 38.5 04/23/2015 1542   PLT 420 (H) 01/04/2019 0506   PLT 418 (H) 04/23/2015 1542   MCV 93.1 01/04/2019 0506   MCV 90 04/23/2015 1542   MCH 31.0 01/04/2019 0506   MCHC 33.3 01/04/2019 0506   RDW 11.3 (L) 01/04/2019 0506   RDW 14.5 04/23/2015  1542   LYMPHSABS 1.1 12/28/2018 0101   LYMPHSABS 1.5 04/23/2015 1542   MONOABS 0.9 12/28/2018 0101   EOSABS 0.0 12/28/2018 0101   EOSABS 0.0 04/23/2015 1542   BASOSABS 0.1 12/28/2018 0101   BASOSABS 0.0 04/23/2015 1542    Lab Results  Component Value Date   HGBA1C 5.7 (H) 12/30/2018    Lab  Results  Component Value Date   LABURIC 5.6 12/31/2018    Assessment & Plan:   1. Acute idiopathic gout of right ankle Completed course of Prednisone Will commence Colchicine - rx sent to [facility] pharmacy which will be more cost effective for him If flares recur consider adding Allopurinol Educated on low purine eating plan - Colchicine (MITIGARE) 0.6 MG CAPS; Take 2 caps (1.2mg ) at the onset of a Gout flare. Repeat 1 tab (0.6mg ) in 2 hours if symptoms persist  Dispense: 60 capsule; Refill: 3 - Uric Acid  2. Essential hypertension New diagnosis Counseled on blood pressure goal of less than 130/80, low-sodium, DASH diet, medication compliance, 150 minutes of moderate intensity exercise per week. Discussed medication compliance, adverse effects. - amLODipine (NORVASC) 5 MG tablet; Take 1 tablet (5 mg total) by mouth daily.  Dispense: 90 tablet; Refill: 1  3. Prepatellar abscess S/p irrigation and debridement of right knee Improving Currently on Augmentin Continue follow up with Orthopedics    Meds ordered this encounter  Medications  . Colchicine (MITIGARE) 0.6 MG CAPS    Sig: Take 2 caps (1.2mg ) at the onset of a Gout flare. Repeat 1 tab (0.6mg ) in 2 hours if symptoms persist    Dispense:  60 capsule    Refill:  3    Please provide alternative brand if there's something his insurance will cover at a reasonable copay.  Marland Kitchen amLODipine (NORVASC) 5 MG tablet    Sig: Take 1 tablet (5 mg total) by mouth daily.    Dispense:  90 tablet    Refill:  1    Follow-up: Return in about 3 months (around 04/21/2019) for medical conditions.       Charlott Rakes, MD, FAAFP. [facility] [place], [place]   01/19/2019, 10:50 AM

## 2019-01-19 NOTE — Patient Instructions (Signed)

## 2019-01-19 NOTE — Telephone Encounter (Signed)
Patient called and said his prescription that you sent to [facility] was $400, if there is another route that is cheaper he would like to do that. Please contact patient.

## 2019-01-19 NOTE — Telephone Encounter (Signed)
I sent Amlodipine and Colchicine to [facility] for this patient as the Colchicine was too expensive at another Pharmacy. Can you please see what the issue is here in [facility] as it should not be that expensive? Thanks.

## 2019-01-19 NOTE — Progress Notes (Signed)
Here to get established.  Following up on hospital visit for cellulitis of R knee, infected prepatellar bursa. Has been seen by Ortho. Concerns about gout in his R ankle. Needs Colchicine sent to [place] due to cost at a regular pharmacy. Ortho did prescribe Prednisone to take in the meantime.

## 2019-01-20 LAB — URIC ACID: Uric Acid: 5.4 mg/dL (ref 3.7–8.6)

## 2019-01-20 MED ORDER — LISINOPRIL 5 MG PO TABS: 5.0000 mg | ORAL_TABLET | Freq: Every day | ORAL | 3 refills | Status: DC

## 2019-01-20 MED ORDER — ALLOPURINOL 300 MG PO TABS: 300.0000 mg | ORAL_TABLET | Freq: Every day | ORAL | 3 refills | Status: DC

## 2019-01-20 MED FILL — ALLOPURINOL 300 MG TAB: 300 | 30 days supply | Qty: 30 | Fill #0

## 2019-01-20 MED FILL — LISINOPRIL 5 MG TABLET: 5 | 30 days supply | Qty: 30 | Fill #0

## 2019-01-20 NOTE — Telephone Encounter (Signed)
Left voice mail to call back 

## 2019-01-20 NOTE — Telephone Encounter (Signed)
Can you pls inform him I have changed his BP and Gout medications as he would still have a high co-pay with his insurance with the former (Colchicine and Amlodipine). Allopurinol for Gout will have to be taken daily for Gout prevention and Lisinopril is the new antihypertensive. I have ordered a BMP to check his potassium in 2 wks, please schedule an appointment for him Thanks.

## 2019-01-20 NOTE — Telephone Encounter (Signed)
Checked with the pharmacy at [facility]. Patient was found to have insurance; I ran for brand Colcrys and this brought the copay down to $100. His insurance will not cover generic colchicine or brand Mitigare. Of note, his insurance must be low premium, high deductible as his copay for amlodipine is $20.

## 2019-01-20 NOTE — Addendum Note (Signed)
Addended by: Charlott Rakes on: 01/20/2019 09:36 AM   Modules accepted: Orders

## 2019-01-25 NOTE — Progress Notes (Signed)
Patient notified of results & recommendations. Expressed understanding.

## 2019-01-25 NOTE — Telephone Encounter (Signed)
Patient notified. Will call back to schedule lab appointment.

## 2019-02-15 ENCOUNTER — Other Ambulatory Visit: Payer: Self-pay

## 2019-02-15 ENCOUNTER — Other Ambulatory Visit: Payer: Self-pay | Admitting: Radiology

## 2019-02-15 ENCOUNTER — Ambulatory Visit (INDEPENDENT_AMBULATORY_CARE_PROVIDER_SITE_OTHER): Payer: Self-pay | Admitting: Orthopaedic Surgery

## 2019-02-15 ENCOUNTER — Encounter: Payer: Self-pay | Admitting: Orthopaedic Surgery

## 2019-02-15 DIAGNOSIS — M71161 Other infective bursitis, right knee: Secondary | ICD-10-CM

## 2019-02-15 NOTE — Progress Notes (Signed)
HPI: Mr. Kubas returns today 4 weeks status post irrigation debridement right knee prepatellar bursitis.  He continues to have weakness in the knee and now is using a rolling walker.  He has finished the Augmentin.  He states that he just feels weak.  Physical exam right knee surgical incisions are well-healed no signs of infection.  No signs of infection throughout the knee with some slight bogginess in the knee but no abnormal warmth.  Has good range of motion of the knee.  Ambulates with a rolling walker.  Impression status post right knee irrigation debridement for an infected prepatellar bursitis  Plan: We will send in physical therapy for range of motion strengthening.  We will keep him out of work until follow-up in a month.  Did ask about also a spot on his back where he has a large mass about the size of a half dollar could represent lipoma.  There is no abnormal warmth this area.  He also has a right index finger that is developed a line under it which has been there for about a year.  Told him that recommend he see dermatology for these.  We will see him back in a month to see how he is doing in regards to his knee.

## 2019-03-14 ENCOUNTER — Ambulatory Visit: Payer: Self-pay | Admitting: Physical Therapy

## 2019-03-16 ENCOUNTER — Other Ambulatory Visit: Payer: Self-pay

## 2019-03-16 ENCOUNTER — Ambulatory Visit (INDEPENDENT_AMBULATORY_CARE_PROVIDER_SITE_OTHER): Payer: Self-pay | Admitting: Physical Therapy

## 2019-03-16 ENCOUNTER — Encounter: Payer: Self-pay | Admitting: Physical Therapy

## 2019-03-16 DIAGNOSIS — G8929 Other chronic pain: Secondary | ICD-10-CM

## 2019-03-16 DIAGNOSIS — M25561 Pain in right knee: Secondary | ICD-10-CM

## 2019-03-16 DIAGNOSIS — R2689 Other abnormalities of gait and mobility: Secondary | ICD-10-CM

## 2019-03-16 DIAGNOSIS — M6281 Muscle weakness (generalized): Secondary | ICD-10-CM

## 2019-03-16 DIAGNOSIS — R600 Localized edema: Secondary | ICD-10-CM

## 2019-03-16 NOTE — Patient Instructions (Signed)
Access Code: [email or web address]  URL: [email or web address]  Date: 03/16/2019  Prepared by: Elsie Ra   Exercises  Seated Hamstring Stretch - 2-3 sets - 30 hold - 2x daily - 6x weekly  Supine Heel Slide with Strap - 10 reps - 1 sets - 3-5x daily - 7x weekly  Supine Active Straight Leg Raise - 10 reps - 1-2 sets - 2x daily - 6x weekly  Standing Hamstring Curl with Resistance - 10 reps - 3 sets - 2x daily - 6x weekly  Step Up - 10 reps - 1-2 sets - 2x daily - 6x weekly  Sit to Stand without Arm Support - 10 reps - 1-2 sets - 2x daily - 6x weekly  Partial Lunge with Chair - 10 reps - 1 sets - 2x daily - 6x weekly  Side Stepping with Resistance at Feet - 10 reps - 3 sets - 2x daily - 6x weekly  Single Leg Stance - 3-5 reps - 1 sets - as long as you can hold - 2x daily - 6x weekly

## 2019-03-16 NOTE — Therapy (Signed)
[facility] Physical Therapy [address] [place], [place], [address] Phone: [phone]   Fax:  [phone]  Physical Therapy Evaluation  Patient Details  Name: [name] MRN: [id number] Date of Birth: 12-14-1955 Referring Provider (PT): Pete Pelt, [place]   Encounter Date: 03/16/2019  PT End of Session - 03/16/19 2230    Visit Number  1    Number of Visits  12    Date for PT Re-Evaluation  04/27/19    PT Start Time  1400    PT Stop Time  1445    PT Time Calculation (min)  45 min    Activity Tolerance  Patient tolerated treatment well    Behavior During Therapy  [facility] for tasks assessed/performed       Past Medical History:  Diagnosis Date  . Erectile dysfunction   . Gout   . Polyarthritis   . Pre-diabetes     Past Surgical History:  Procedure Laterality Date  . IRRIGATION AND DEBRIDEMENT KNEE Right 12/28/2018   Procedure: IRRIGATION AND DEBRIDEMENT KNEE;  Surgeon: Mcarthur Rossetti, MD;  Location: [facility];  Service: Orthopedics;  Laterality: Right;  . KNEE SURGERY    . ROTATOR CUFF REPAIR    . THUMB ARTHROSCOPY Right 2007    There were no vitals filed for this visit.   Subjective Assessment - 03/16/19 1404    Subjective  Relays he has always had some knee pain and stiffess since knee surgery when he was born. He is overall independent and performs physical job however in October 2020 His knee became infected and he says no one knows why. He had S/P Irrigation and debridement10/28/20. Since then he had to start walking with RW but says now he has weaned off. He says his knee feels, tired, achy and weak all the time with feelings that it may give out.    How long can you stand comfortably?  2 hours    How long can you walk comfortably?  limited community distances    Patient Stated Goals  get back to normal    Currently in Pain?  Yes    Pain Score  5     Pain Location  Knee    Pain Orientation  Right    Pain Descriptors /  Indicators  Aching;Tiring;Dull    Pain Type  Surgical pain    Pain Radiating Towards  denies    Pain Onset  1 to 4 weeks ago    Pain Frequency  Constant    Aggravating Factors   stairs, prolonged standing or walking    Pain Relieving Factors  rest, NSAIDS    Multiple Pain Sites  No         [facility] PT Assessment - 03/16/19 0001      Assessment   Medical Diagnosis  Infection of prepatellar bursa, right, S/P Irrigation and debridement10/28/20.     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      Cognition   Overall Cognitive Status  Within Functional Limits for tasks assessed      Observation/Other Assessments   Observations  mild edema and warmth present in Rt knee, incision looks well healing      Observation/Other Assessments-Edema    Edema  Circumferential   14.5 inch Lt knee, 15 inch Rt knee     Sensation   Light Touch  Appears Intact      Coordination   Gross Motor Movements are Fluid and Coordinated  Yes      ROM / Strength   AROM / PROM / Strength  AROM;PROM;Strength      AROM   AROM Assessment Site  Knee    Right/Left Knee  Right    Right Knee Extension  0    Right Knee Flexion  110   says he has always had knee stiffness since surgery at birth     PROM   PROM Assessment Site  Knee    Right/Left Knee  Right    Right Knee Flexion  120      Strength   Overall Strength Comments  Rt hip  flexion 4+/5, hip abd 5/5, Rt knee flexion 4/5, knee extension 4+/5 all tested in sitting for Rt knee      Palpation   Patella mobility  good    Palpation comment  denies TTP      Transfers   Transfers  Independent with all Transfers      Ambulation/Gait   Gait Comments  now ambulating without AD for limited community distances, has mild antalgic gait on Rt side with decreased stance time and weight shift to Rt.                Objective measurements completed on examination: See above findings.      [facility] Adult PT Treatment/Exercise - 03/16/19 0001      Modalities   Modalities  Vasopneumatic      Vasopneumatic   Number Minutes Vasopneumatic   10 minutes    Vasopnuematic Location   Knee    Vasopneumatic Pressure  Medium    Vasopneumatic Temperature   34             PT Education - 03/16/19 2230    Education Details  HEP, POC    Person(s) Educated  Patient    Methods  Explanation;Demonstration;Verbal cues;Handout    Comprehension  Verbalized understanding;Returned demonstration;Verbal cues required;Need further instruction          PT Long Term Goals - 03/16/19 2237      PT LONG TERM GOAL #1   Title  Pt will be I and compliant with HEP and verbalize plan for continued exercise post PT discharge. (Target goal for all goals 6 weeks 04/27/19)    Status  New      PT LONG TERM GOAL #2   Title  Pt will improve Rt hip/knee strength to at least 5-/5 MMT to improve function    Status  New      PT LONG TERM GOAL #3   Title  Pt will reduce pain to overall less than 2-3/10 with usual activity and work activity.      PT LONG TERM GOAL #4   Title  Pt will be able to ambulate community  distances at least 1000 ft WNL gait pattern without complaints, and be able ambulate up/down stairs without complaints    Status  New             Plan - 03/16/19 2231    Clinical Impression Statement  Pt presents with Rt knee pain, stiffness, edema, and weakness followng  Infection of prepatellar bursa, right, S/P Irrigation and debridement10/28/20. He will benefit from skilled PT to address his deficits.    Personal Factors and Comorbidities  Comorbidity 1;Past/Current Experience    Comorbidities  previous knee surgery    Examination-Activity Limitations  Lift;Stand;Stairs;Squat;Locomotion Level    Examination-Participation Restrictions  Cleaning;Community Activity;Laundry;Yard Work    [employer] officer  Evolving/Moderate complexity    Clinical Decision Making  Moderate    Rehab Potential  Good    PT Frequency  2x / week    PT Duration  6 weeks    PT Treatment/Interventions  ADLs/Self Care Home Management;Cryotherapy;Electrical Stimulation;Iontophoresis 4mg /ml Dexamethasone;Moist Heat;Ultrasound;Gait training;Stair training;Functional mobility training;Therapeutic activities;Therapeutic exercise;Balance training;Neuromuscular re-education;Manual techniques;Passive range of motion;Dry needling;Joint Manipulations;Taping;Vasopneumatic Device    PT Next Visit Plan  review and update HEP PRN, needs functional knee strengthening and knee ROM    PT Home Exercise Plan  Access Code: [email or web address]       Patient will benefit from skilled therapeutic intervention in order to improve the following deficits and impairments:  Abnormal gait, Decreased activity tolerance, Decreased endurance, Decreased mobility, Decreased range of motion, Decreased strength, Difficulty walking, Impaired flexibility, Pain  Visit Diagnosis: Chronic pain of right knee  Muscle weakness (generalized)  Other abnormalities of gait and mobility  Localized edema     Problem List Patient Active Problem List   Diagnosis Date Noted  . Prepatellar abscess 12/29/2018  . Cellulitis of right knee 12/28/2018  . Infected prepatellar bursa, right   . Pre-diabetes 07/08/2016  . Elevated blood pressure reading without diagnosis of hypertension 06/01/2016  . Dental caries 05/21/2015  .  Thrombocytosis ([place]) 04/24/2015  . Erectile dysfunction 04/23/2015  . Healthcare maintenance 03/08/2015  . Polyarthritis 03/08/2015  . ROTATOR CUFF INJURY, LEFT SHOULDER 06/20/2009    Silvestre Mesi 03/16/2019, 10:42 PM  [facility] Physical Therapy [address] [place], [place], [address] Phone: [phone]   Fax:  [phone]  Name: [name] MRN: [id number] Date of Birth: March 26, 1955

## 2019-03-20 ENCOUNTER — Ambulatory Visit (INDEPENDENT_AMBULATORY_CARE_PROVIDER_SITE_OTHER): Payer: Self-pay | Admitting: Physician Assistant

## 2019-03-20 ENCOUNTER — Encounter: Payer: Self-pay | Admitting: Physician Assistant

## 2019-03-20 ENCOUNTER — Ambulatory Visit: Payer: Self-pay | Admitting: Orthopaedic Surgery

## 2019-03-20 ENCOUNTER — Other Ambulatory Visit: Payer: Self-pay

## 2019-03-20 DIAGNOSIS — M71161 Other infective bursitis, right knee: Secondary | ICD-10-CM

## 2019-03-20 NOTE — Progress Notes (Signed)
HPI: [name] returns today 8 weeks status post irrigation debridement right knee prepatellar bursitis.  He states the knee feels weak and sore.  Is having some throbbing pain in the inferior aspect of the patella.  Has been doing a lot of walking is having pain in the knee.  Has had 1 physical therapy appointment thus far.  Physical exam: Right knee full extension full flexion.  Surgical incisions healing well.  Edema over the anterior aspect of the patella is dissipating.  There is no signs of infection or recurrent prepatellar bursitis.  Calf supple nontender.  No instability valgus varus stressing of the right knee.  Impression: Status post right knee irrigation debridement for infected prepatellar bursitis  Plan: He will continue to work with physical therapy to work on range of motion strengthening the knee.  Slight Voltaren gel to the anterior aspect of the knee up to 4 g 4 times daily.  Follow-up with [name] in 1 month to see what type of progress he has had.  We will keep him out of work until follow-up in a month.

## 2019-03-21 ENCOUNTER — Telehealth: Payer: Self-pay | Admitting: Physical Therapy

## 2019-03-21 ENCOUNTER — Encounter: Payer: Self-pay | Admitting: Physical Therapy

## 2019-03-21 NOTE — Telephone Encounter (Signed)
Pt no show for PT appointment today. They were contacted and informed of this by phone. He states he forgot about the appointment. They were provided the date and time of their next appointment and were instructed to call out front office to reschedule this week. They were instructed to call us to let us know if they cannot make their appointment.   Elsie Ra, PT, DPT 03/21/19 2:05 PM

## 2019-03-29 ENCOUNTER — Ambulatory Visit (INDEPENDENT_AMBULATORY_CARE_PROVIDER_SITE_OTHER): Payer: Self-pay | Admitting: Physical Therapy

## 2019-03-29 ENCOUNTER — Other Ambulatory Visit: Payer: Self-pay

## 2019-03-29 ENCOUNTER — Telehealth: Payer: Self-pay | Admitting: Orthopaedic Surgery

## 2019-03-29 DIAGNOSIS — M25561 Pain in right knee: Secondary | ICD-10-CM

## 2019-03-29 DIAGNOSIS — G8929 Other chronic pain: Secondary | ICD-10-CM

## 2019-03-29 DIAGNOSIS — R600 Localized edema: Secondary | ICD-10-CM

## 2019-03-29 DIAGNOSIS — R2689 Other abnormalities of gait and mobility: Secondary | ICD-10-CM

## 2019-03-29 DIAGNOSIS — M6281 Muscle weakness (generalized): Secondary | ICD-10-CM

## 2019-03-29 NOTE — Therapy (Signed)
[facility] Physical Therapy [address] [place], [place], [address] Phone: [phone]   Fax:  [phone]  Physical Therapy Treatment  Patient Details  Name: [name] MRN: [id number] Date of Birth: [birth year] Referring Provider (PT): [name], [place]   Encounter Date: 03/29/2019  PT End of Session - 03/29/19 1445    Visit Number  2    Number of Visits  12    Date for PT Re-Evaluation  04/27/19    PT Start Time  [id number]    PT Stop Time  1450    PT Time Calculation (min)  45 min    Activity Tolerance  Patient tolerated treatment well    Behavior During Therapy  [facility] for tasks assessed/performed       Past Medical History:  Diagnosis Date  . Erectile dysfunction   . Gout   . Polyarthritis   . Pre-diabetes     Past Surgical History:  Procedure Laterality Date  . IRRIGATION AND DEBRIDEMENT KNEE Right 12/28/2018   Procedure: IRRIGATION AND DEBRIDEMENT KNEE;  Surgeon: Austin Rossetti, MD;  Location: [facility];  Service: Orthopedics;  Laterality: Right;  . KNEE SURGERY    . ROTATOR CUFF REPAIR    . THUMB ARTHROSCOPY Right 2007    There were no vitals filed for this visit.  Subjective Assessment - 03/29/19 1444    Subjective  relays his Rt knee feels tired and weak, about 5/10 pain    How long can you stand comfortably?  2 hours    How long can you walk comfortably?  limited community distances    Patient Stated Goals  get back to normal    Pain Onset  1 to 4 weeks ago         [facility] PT Assessment - 03/29/19 0001      Assessment   Medical Diagnosis  Infection of prepatellar bursa, right, S/P Irrigation and debridement10/28/20.     Referring Provider (PT)  Austin Pelt, PA-C    Onset Date/Surgical Date  12/28/18      AROM   Right Knee Extension  0    Right Knee Flexion  122   AAROM with strap                  [facility] Adult PT Treatment/Exercise - 03/29/19 0001      Exercises   Exercises  Knee/Hip       Knee/Hip Exercises: Stretches   Active Hamstring Stretch  Right;2 reps;30 seconds    Quad Stretch  Right;2 reps;30 seconds    Other Knee/Hip Stretches  AAROM heelslides 10 sec X 10      Knee/Hip Exercises: Aerobic   Recumbent Bike  6 min no resistance for ROM      Knee/Hip Exercises: Machines for Strengthening   Total Gym Leg Press  bilat press 100 lbs X 15 reps, dropped to Rt leg only press for 75 lbs, 15 reps each      Knee/Hip Exercises: Standing   Functional Squat Limitations  mini squats at sink with cues to stay in pain free ROM      Knee/Hip Exercises: Seated   Long Arc Quad  Right;20 reps    Long Arc Quad Weight  2 lbs.    Hamstring Curl  Right;20 reps    Hamstring Limitations  L3 band      Knee/Hip Exercises: Supine   Short Arc Quad Sets  Right;15 reps    Short Arc Quad Sets Limitations  2  Straight Leg Raises  Right;15 reps      Vasopneumatic   Number Minutes Vasopneumatic   10 minutes    Vasopnuematic Location   Knee    Vasopneumatic Pressure  Medium    Vasopneumatic Temperature   34                  PT Long Term Goals - 03/16/19 2237      PT LONG TERM GOAL #1   Title  Pt will be I and compliant with HEP and verbalize plan for continued exercise post PT discharge. (Target goal for all goals 6 weeks 04/27/19)    Status  New      PT LONG TERM GOAL #2   Title  Pt will improve Rt hip/knee strength to at least 5-/5 MMT to improve function    Status  New      PT LONG TERM GOAL #3   Title  Pt will reduce pain to overall less than 2-3/10 with usual activity and work activity.      PT LONG TERM GOAL #4   Title  Pt will be able to ambulate community distances at least 1000 ft WNL gait pattern without complaints, and be able ambulate up/down stairs without complaints    Status  New            Plan - 03/29/19 1445    Clinical Impression Statement  Session focused on stretching and strengthening for Rt knee with fair to good toelerance. He needs  cues at time to stay in pain free ROM and to avoid working into pain. He did have mild edema in his Rt knee and lower leg so was treated with vaso to reduce this.    Personal Factors and Comorbidities  Comorbidity 1;Past/Current Experience    Comorbidities  previous knee surgery    Examination-Activity Limitations  Lift;Stand;Stairs;Squat;Locomotion Level    Examination-Participation Restrictions  Cleaning;Community Activity;Laundry;Yard Work    [employer] officer  Evolving/Moderate complexity    Rehab Potential  Good    PT Frequency  2x / week    PT Duration  6 weeks    PT Treatment/Interventions  ADLs/Self Care Home Management;Cryotherapy;Electrical Stimulation;Iontophoresis 4mg /ml Dexamethasone;Moist Heat;Ultrasound;Gait training;Stair training;Functional mobility training;Therapeutic activities;Therapeutic exercise;Balance training;Neuromuscular re-education;Manual techniques;Passive range of motion;Dry needling;Joint Manipulations;Taping;Vasopneumatic Device    PT Next Visit Plan  review and update HEP PRN, needs functional knee strengthening and knee ROM, vaso for edema    PT Home Exercise Plan  Access Code: [email or web address]       Patient will benefit from skilled therapeutic intervention in order to improve the following deficits and impairments:  Abnormal gait, Decreased activity tolerance, Decreased endurance, Decreased mobility, Decreased range of motion, Decreased strength, Difficulty walking, Impaired flexibility, Pain  Visit Diagnosis: Chronic pain of right knee  Muscle weakness (generalized)  Other abnormalities of gait and mobility  Localized edema     Problem List Patient Active Problem List   Diagnosis Date Noted  . Prepatellar abscess 12/29/2018  . Cellulitis of right knee 12/28/2018  . Infected prepatellar bursa, right   . Pre-diabetes 07/08/2016  . Elevated blood pressure reading without diagnosis of hypertension 06/01/2016  . Dental caries 05/21/2015   . Thrombocytosis ([place]) 04/24/2015  . Erectile dysfunction 04/23/2015  . Healthcare maintenance 03/08/2015  . Polyarthritis 03/08/2015  . ROTATOR CUFF INJURY, LEFT SHOULDER 06/20/2009    [name] 03/29/2019, 2:47 PM  [facility] Physical Therapy [address] [place], [place], [address] Phone: [phone]   Fax:  [phone]  Name: [name] MRN: [id number] Date of Birth: [birth year]

## 2019-03-29 NOTE — Telephone Encounter (Signed)
Pt came into the office said that his short term disability company is needing some type of documentation stating his current diagnosis, his plan, current restrictions, and estimated time back to work. Pt is stating it needs to be dated a specific time frame, Please give the pt a call to verify exactly what he is needing, he seems confused at first he was needing to have std forms filled out but then requested this information from dr.[email or web address]. please fax that information over to [phone].   Pt's phone # [phone]

## 2019-03-30 NOTE — Telephone Encounter (Signed)
Called patient back to let him know that all this info should've been on his FMLA forms

## 2019-04-05 ENCOUNTER — Encounter: Payer: Self-pay | Admitting: Physical Therapy

## 2019-04-12 ENCOUNTER — Encounter: Payer: Self-pay | Admitting: Rehabilitative and Restorative Service Providers"

## 2019-04-12 ENCOUNTER — Ambulatory Visit (INDEPENDENT_AMBULATORY_CARE_PROVIDER_SITE_OTHER): Payer: Self-pay | Admitting: Rehabilitative and Restorative Service Providers"

## 2019-04-12 ENCOUNTER — Other Ambulatory Visit: Payer: Self-pay

## 2019-04-12 DIAGNOSIS — R2689 Other abnormalities of gait and mobility: Secondary | ICD-10-CM

## 2019-04-12 DIAGNOSIS — R600 Localized edema: Secondary | ICD-10-CM

## 2019-04-12 DIAGNOSIS — M6281 Muscle weakness (generalized): Secondary | ICD-10-CM

## 2019-04-12 DIAGNOSIS — M25561 Pain in right knee: Secondary | ICD-10-CM

## 2019-04-12 DIAGNOSIS — G8929 Other chronic pain: Secondary | ICD-10-CM

## 2019-04-12 NOTE — Therapy (Addendum)
[facility] Physical Therapy [address] [place], [place], [address] Phone: [phone]   Fax:  [phone]  Physical Therapy Treatment/Discharge  Patient Details  Name: [name] MRN: [id number] Date of Birth: 1955/08/17 Referring Provider (PT): Pete Pelt, [place]   Encounter Date: 04/12/2019  PT End of Session - 04/12/19 1439    Visit Number  3    Number of Visits  12    Date for PT Re-Evaluation  04/27/19    PT Start Time  1404    PT Stop Time  1455    PT Time Calculation (min)  51 min    Activity Tolerance  Patient tolerated treatment well    Behavior During Therapy  [facility] for tasks assessed/performed       Past Medical History:  Diagnosis Date  . Erectile dysfunction   . Gout   . Polyarthritis   . Pre-diabetes     Past Surgical History:  Procedure Laterality Date  . IRRIGATION AND DEBRIDEMENT KNEE Right 12/28/2018   Procedure: IRRIGATION AND DEBRIDEMENT KNEE;  Surgeon: Mcarthur Rossetti, MD;  Location: [facility];  Service: Orthopedics;  Laterality: Right;  . KNEE SURGERY    . ROTATOR CUFF REPAIR    . THUMB ARTHROSCOPY Right 2007    There were no vitals filed for this visit.  Subjective Assessment - 04/12/19 1407    Subjective  Pt. stated ache mostly at this time around a 5/10.  Pt. stated leg feels tired too.  Somewhat improved at this time.    How long can you stand comfortably?  2 hours    How long can you walk comfortably?  limited community distances    Patient Stated Goals  get back to normal    Currently in Pain?  Yes    Pain Score  5     Pain Location  Knee    Pain Orientation  Right    Pain Type  Surgical pain    Pain Onset  1 to 4 weeks ago    Aggravating Factors   walking, stairs, pressure on RLE                       [facility] Adult PT Treatment/Exercise - 04/12/19 0001      Knee/Hip Exercises: Aerobic   Recumbent Bike  L1 10 mins      Knee/Hip Exercises: Machines for Strengthening   Total Gym  Leg Press  Rt leg only press for 75 lbs, 3 x 10      Knee/Hip Exercises: Standing   Forward Step Up  Both;2 sets;10 reps      Knee/Hip Exercises: Seated   Long Arc Quad  Right;3 sets;15 reps    Long Arc Quad Weight  5 lbs.    Hamstring Curl  Right;3 sets;10 reps   blue band resistance   Sit to Sand  2 sets;10 reps   18 inch table c 10 lbs in BUE     Vasopneumatic   Number Minutes Vasopneumatic   10 minutes    Vasopnuematic Location   Knee    Vasopneumatic Pressure  Medium    Vasopneumatic Temperature   34             PT Education - 04/12/19 1409    Education Details  HEP review, cues for intervention in treatment.    Methods  Explanation;Verbal cues    Comprehension  Verbalized understanding;Returned demonstration          PT Long  Term Goals - 03/16/19 2237      PT LONG TERM GOAL #1   Title  Pt will be I and compliant with HEP and verbalize plan for continued exercise post PT discharge. (Target goal for all goals 6 weeks 04/27/19)    Status  New      PT LONG TERM GOAL #2   Title  Pt will improve Rt hip/knee strength to at least 5-/5 MMT to improve function    Status  New      PT LONG TERM GOAL #3   Title  Pt will reduce pain to overall less than 2-3/10 with usual activity and work activity.      PT LONG TERM GOAL #4   Title  Pt will be able to ambulate community distances at least 1000 ft WNL gait pattern without complaints, and be able ambulate up/down stairs without complaints    Status  New            Plan - 04/12/19 1427    Clinical Impression Statement  Medial joint line symptoms noted in approx. 20 deg knee flexion during squat/sit to stand transfer movement.  Mild tendenress to touch in area.  Continued strengthneing on RLE indicated at this time.    Personal Factors and Comorbidities  Comorbidity 1;Past/Current Experience    Comorbidities  previous knee surgery    Examination-Activity Limitations  Lift;Stand;Stairs;Squat;Locomotion Level     Examination-Participation Restrictions  Cleaning;Community Activity;Laundry;Yard Work    [employer] officer  Evolving/Moderate complexity    Rehab Potential  Good    PT Frequency  2x / week    PT Duration  6 weeks    PT Treatment/Interventions  ADLs/Self Care Home Management;Cryotherapy;Electrical Stimulation;Iontophoresis '4mg'$ /ml Dexamethasone;Moist Heat;Ultrasound;Gait training;Stair training;Functional mobility training;Therapeutic activities;Therapeutic exercise;Balance training;Neuromuscular re-education;Manual techniques;Passive range of motion;Dry needling;Joint Manipulations;Taping;Vasopneumatic Device    PT Next Visit Plan  WB knee strengthening and knee ROM, vaso for edema    PT Home Exercise Plan  Access Code: [email or web address]    Consulted and Agree with Plan of Care  Patient       Patient will benefit from skilled therapeutic intervention in order to improve the following deficits and impairments:  Abnormal gait, Decreased activity tolerance, Decreased endurance, Decreased mobility, Decreased range of motion, Decreased strength, Difficulty walking, Impaired flexibility, Pain  Visit Diagnosis: Chronic pain of right knee  Muscle weakness (generalized)  Other abnormalities of gait and mobility  Localized edema     Problem List Patient Active Problem List   Diagnosis Date Noted  . Prepatellar abscess 12/29/2018  . Cellulitis of right knee 12/28/2018  . Infected prepatellar bursa, right   . Pre-diabetes 07/08/2016  . Elevated blood pressure reading without diagnosis of hypertension 06/01/2016  . Dental caries 05/21/2015  . Thrombocytosis ([place]) 04/24/2015  . Erectile dysfunction 04/23/2015  . Healthcare maintenance 03/08/2015  . Polyarthritis 03/08/2015  . ROTATOR CUFF INJURY, LEFT SHOULDER 06/20/2009   PHYSICAL THERAPY DISCHARGE SUMMARY  Visits from Start of Care: 3  Current functional level related to goals / functional outcomes: See last note    Remaining deficits: See last note   Education / Equipment: HEP Plan:                                                    Patient goals were not met. Patient is being discharged due  to not returning since the last visit.  ?????       Scot Jun, PT, DPT, OCS, ATC 07/25/19  4:09 PM      [facility] [address] [place], [place], [address] Phone: [phone]   Fax:  [phone]  Name: [name] MRN: [id number] Date of Birth: 1955/07/14

## 2019-04-17 ENCOUNTER — Telehealth: Payer: Self-pay | Admitting: Radiology

## 2019-04-17 ENCOUNTER — Encounter: Payer: Self-pay | Admitting: Physician Assistant

## 2019-04-17 ENCOUNTER — Ambulatory Visit (INDEPENDENT_AMBULATORY_CARE_PROVIDER_SITE_OTHER): Payer: Self-pay | Admitting: Physician Assistant

## 2019-04-17 ENCOUNTER — Other Ambulatory Visit: Payer: Self-pay

## 2019-04-17 DIAGNOSIS — M71161 Other infective bursitis, right knee: Secondary | ICD-10-CM

## 2019-04-17 DIAGNOSIS — M1711 Unilateral primary osteoarthritis, right knee: Secondary | ICD-10-CM

## 2019-04-17 NOTE — Progress Notes (Signed)
Office Visit Note   Patient: [name]           Date of Birth: 03/03/1955           MRN: [id number] Visit Date: 04/17/2019              Requested by: No referring provider defined for this encounter. PCP: Patient, No Pcp Per   Assessment & Plan: Visit Diagnoses:  1. Infection of prepatellar bursa, right   2. Primary osteoarthritis of right knee     Plan: He will continue to work on range of motion strengthening of the right knee.  He will return to work on March 1st, full duties.  He is encouraged to pick up an open patella pullover knee sleeve.  Discussed with him placing a cortisone injection in the to possibly help with the arthritis pain is having.  Do feel it is safe at this point now that he is 3-1/2 months status post irrigation and debridement of the right knee prepatellar bursitis.  Follow-Up Instructions: Return in about 4 weeks (around 05/15/2019).   Orders:  No orders of the defined types were placed in this encounter.  No orders of the defined types were placed in this encounter.     Procedures: No procedures performed   Clinical Data: No additional findings.   Subjective: Chief Complaint  Patient presents with  . Right Knee - Follow-up    HPI [name] returns today 3 and half months status post right knee irrigation debridement prepatellar bursitis.  He has had no fevers or chills.  He is using Voltaren gel on a twice daily.  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  States he continues to have dull achy pain in the knee.  He has had no significant swelling.  He does feel extremity towards improvement.  Is been out of work since the time of surgery. Radiographs performed 12/28/2018 showed tricompartmental degenerative changes in the knee with moderate medial joint line narrowing and moderate patellofemoral changes.  Lateral compartment with calcific changes in the meniscus.  Review of Systems See HPI otherwise  negative  Objective: Vital Signs: There were no vitals taken for this visit.  Physical Exam Pulmonary:     Effort: Pulmonary effort is normal.  Neurological:     Mental Status: He is alert and oriented to person, place, and time.  Psychiatric:        Mood and Affect: Mood normal.     Ortho Exam Bilateral knees good range of motion without pain.  Tenderness along medial joint line of the right knee.  Well-healed surgical incision right knee.  No instability valgus varus stressing of either knee.  No abnormal warmth erythema or effusion of either knee. Specialty Comments:  No specialty comments available.  Imaging: No results found.   PMFS History: Patient Active Problem List   Diagnosis Date Noted  . Prepatellar abscess 12/29/2018  . Cellulitis of right knee 12/28/2018  . Infected prepatellar bursa, right   . Pre-diabetes 07/08/2016  . Elevated blood pressure reading without diagnosis of hypertension 06/01/2016  . Dental caries 05/21/2015  . Thrombocytosis ([place]) 04/24/2015  . Erectile dysfunction 04/23/2015  . Healthcare maintenance 03/08/2015  . Polyarthritis 03/08/2015  . ROTATOR CUFF INJURY, LEFT SHOULDER 06/20/2009   Past Medical History:  Diagnosis Date  . Erectile dysfunction   . Gout   . Polyarthritis   . Pre-diabetes     Family History  Problem Relation Age  of Onset  . Breast cancer Mother   . Prostate cancer Father   . Hypertension Father   . Lung cancer Son     Past Surgical History:  Procedure Laterality Date  . IRRIGATION AND DEBRIDEMENT KNEE Right 12/28/2018   Procedure: IRRIGATION AND DEBRIDEMENT KNEE;  Surgeon: Mcarthur Rossetti, MD;  Location: [facility];  Service: Orthopedics;  Laterality: Right;  . KNEE SURGERY    . ROTATOR CUFF REPAIR    . THUMB ARTHROSCOPY Right 2007   Social History   Occupational History  . Not on file  Tobacco Use  . Smoking status: Never Smoker  . Smokeless tobacco: Never Used  Substance and Sexual Activity    . Alcohol use: No    Alcohol/week: 0.0 standard drinks  . Drug use: No  . Sexual activity: Not on file

## 2019-04-17 NOTE — Telephone Encounter (Signed)
Sent new note via fax [phone]

## 2019-04-20 ENCOUNTER — Ambulatory Visit (INDEPENDENT_AMBULATORY_CARE_PROVIDER_SITE_OTHER): Payer: Self-pay | Admitting: Internal Medicine

## 2019-04-20 ENCOUNTER — Encounter: Payer: Self-pay | Admitting: Internal Medicine

## 2019-04-20 DIAGNOSIS — I10 Essential (primary) hypertension: Secondary | ICD-10-CM

## 2019-04-20 DIAGNOSIS — M109 Gout, unspecified: Secondary | ICD-10-CM

## 2019-04-20 DIAGNOSIS — Z1211 Encounter for screening for malignant neoplasm of colon: Secondary | ICD-10-CM

## 2019-04-20 MED ORDER — LISINOPRIL 5 MG PO TABS: 5.0000 mg | ORAL_TABLET | Freq: Every day | ORAL | 3 refills | Status: DC

## 2019-04-20 NOTE — Progress Notes (Signed)
Virtual Visit via Telephone Note  I connected with [name], on 04/20/2019 at 9:12 AM by telephone due to the COVID-19 pandemic and verified that I am speaking with the correct person using two identifiers.   Consent: I discussed the limitations, risks, security and privacy concerns of performing an evaluation and management service by telephone and the availability of in person appointments. I also discussed with the patient that there may be a patient responsible charge related to this service. The patient expressed understanding and agreed to proceed.   Location of Patient: Home   Location of Provider: Home    Persons participating in Telemedicine visit: Calvert [facility]son [facility] Dr. Juleen China      History of Present Illness: Patient has a follow up for gout. Reports he was unable to afford Allopurinol due to high deductible with his insurance plan. Has not had any further gout flares.   He reports he did not pick up his Lisinopril when the Allopurinol was so pricey. He does not monitor his BP at home. Denies chest pain, SOB, vision changes.    Past Medical History:  Diagnosis Date  . Erectile dysfunction   . Gout   . Polyarthritis   . Pre-diabetes    No Known Allergies  No current outpatient medications on file prior to visit.   No current facility-administered medications on file prior to visit.    Observations/Objective: NAD. Speaking clearly.  Work of breathing normal.  Alert and oriented. Mood appropriate.   Assessment and Plan: 1. Gout of right ankle, unspecified cause, unspecified chronicity Resolved. Unable to afford Allopurinol.   2. Essential hypertension Unknown if BP at goal. Reviewed prior BPs, all elevated. Have re-prescribed Lisinopril. Discussed importance of BP control to prevent heart attacks, strokes, end organ damage. Will need to check BMET in one week with start of medication.  - lisinopril (ZESTRIL) 5 MG tablet; Take 1 tablet (5  mg total) by mouth daily.  Dispense: 30 tablet; Refill: 3 - Basic Metabolic Panel; Future  3. Screening for colon cancer - Ambulatory referral to Gastroenterology   Follow Up Instructions: Lab visit and BP check in one week; Annual exam needs to be scheduled    I discussed the assessment and treatment plan with the patient. The patient was provided an opportunity to ask questions and all were answered. The patient agreed with the plan and demonstrated an understanding of the instructions.   The patient was advised to call back or seek an in-person evaluation if the symptoms worsen or if the condition fails to improve as anticipated.     I provided 12 minutes total of non-face-to-face time during this encounter including median intraservice time, reviewing previous notes, investigations, ordering medications, medical decision making, coordinating care and patient verbalized understanding at the end of the visit.    Phill Myron, D.O. Primary Care at [facility]  04/20/2019, 9:12 AM

## 2019-06-08 ENCOUNTER — Encounter: Payer: Self-pay | Admitting: Physician Assistant

## 2019-06-08 ENCOUNTER — Other Ambulatory Visit: Payer: Self-pay

## 2019-06-08 ENCOUNTER — Ambulatory Visit (INDEPENDENT_AMBULATORY_CARE_PROVIDER_SITE_OTHER): Payer: Self-pay

## 2019-06-08 ENCOUNTER — Telehealth: Payer: Self-pay | Admitting: Radiology

## 2019-06-08 ENCOUNTER — Ambulatory Visit (INDEPENDENT_AMBULATORY_CARE_PROVIDER_SITE_OTHER): Payer: Self-pay | Admitting: Physician Assistant

## 2019-06-08 DIAGNOSIS — M1711 Unilateral primary osteoarthritis, right knee: Secondary | ICD-10-CM

## 2019-06-08 NOTE — Telephone Encounter (Signed)
Pt has no insurance. Tried calling pt to discuss [employer] assistance program but pt didn't answer and mail box was full. I will try again later.

## 2019-06-08 NOTE — Telephone Encounter (Signed)
Right knee supplemental injection 

## 2019-06-08 NOTE — Progress Notes (Signed)
Office Visit Note   Patient: [name]           Date of Birth: [birth year]           MRN: [id number] Visit Date: 06/08/2019              Requested by: Nicolette Bang, DO [place],  [place] [address] PCP: Nicolette Bang, DO   Assessment & Plan: Visit Diagnoses:  1. Primary osteoarthritis of right knee     Plan: Undocumented cortisone last time right knee patient states did not help.  Would recommend supplemental injection in the knee at this time.  Continue the use of Voltaren gel to the knee.  Follow-up once supplemental injections available.  Questions were encouraged and answered.  Follow-Up Instructions: Return for Supplemental injection.   Orders:  Orders Placed This Encounter  Procedures   XR Knee 1-2 Views Right   No orders of the defined types were placed in this encounter.     Procedures: No procedures performed   Clinical Data: No additional findings.   Subjective: Chief Complaint  Patient presents with   Right Knee - Pain    HPI [name] returns today follow-up of his right knee.  He was last seen 04/17/2019 for his knee.  He states at that time that we injected knee and it really gave him no relief.  He is having pain along the medial joint line.  He is now 5 months status post irrigation debridement right knee prepatellar bursitis.  He still having pain medial joint line.  He states he had no pain in the knee prior to the prepatellar bursitis. Review of Systems Please see HPI  Objective: Vital Signs: There were no vitals taken for this visit.  Physical Exam Constitutional:      Appearance: He is not ill-appearing or diaphoretic.  Pulmonary:     Effort: Pulmonary effort is normal.  Neurological:     Mental Status: He is alert and oriented to person, place, and time.  Psychiatric:        Mood and Affect: Mood normal.     Ortho Exam Right knee full range of motion.  No instability valgus varus  stressing.  Tenderness along medial joint line of the right knee.  Positive crepitus with passive range of motion.  Surgical incisions well-healed.  No signs of infection.  No effusion. Specialty Comments:  No specialty comments available.  Imaging: XR Knee 1-2 Views Right  Result Date: 06/08/2019 Right knee AP and lateral views: Tricompartmental arthritis with moderate medial compartmental arthritic changes and mild to moderate patellofemoral changes.  Periarticular spurring off the lateral joint line.  No acute fractures.    PMFS History: Patient Active Problem List   Diagnosis Date Noted   Prepatellar abscess 12/29/2018   Cellulitis of right knee 12/28/2018   Infected prepatellar bursa, right    Pre-diabetes 07/08/2016   Elevated blood pressure reading without diagnosis of hypertension 06/01/2016   Dental caries 05/21/2015   Thrombocytosis ([place]) 04/24/2015   Erectile dysfunction 04/23/2015   Polyarthritis 03/08/2015   ROTATOR CUFF INJURY, LEFT SHOULDER 06/20/2009   Past Medical History:  Diagnosis Date   Erectile dysfunction    Gout    Polyarthritis    Pre-diabetes     Family History  Problem Relation Age of Onset   Breast cancer Mother    Prostate cancer Father    Hypertension Father    Lung cancer Son     Past  Surgical History:  Procedure Laterality Date   IRRIGATION AND DEBRIDEMENT KNEE Right 12/28/2018   Procedure: IRRIGATION AND DEBRIDEMENT KNEE;  Surgeon: Mcarthur Rossetti, MD;  Location: [facility];  Service: Orthopedics;  Laterality: Right;   KNEE SURGERY     ROTATOR CUFF REPAIR     THUMB ARTHROSCOPY Right 2007   Social History   Occupational History   Not on file  Tobacco Use   Smoking status: Never Smoker   Smokeless tobacco: Never Used  Substance and Sexual Activity   Alcohol use: No    Alcohol/week: 0.0 standard drinks   Drug use: No   Sexual activity: Not on file

## 2019-06-09 NOTE — Telephone Encounter (Signed)
Tried calling pt but no answer and mailbox was full

## 2019-06-12 NOTE — Telephone Encounter (Signed)
Please advised.  Patient is fighting to have right knee considered for WC, WC is not approved yet.  Patient states to have insurance that is not on file, currently listed as self pay.  Should they wait for [facility] approval or file with insurance?

## 2019-06-12 NOTE — Telephone Encounter (Signed)
I called pt to discuss but he stated he is trying to get this covered with Work comp. He stated he can file this injection with his insurance from work but that he needs something from [place] and Dr. Ninfa Linden stating this started from when he twisted his knee at work to get [facility] approved. I am not sure how to file this injection. Please advise

## 2019-06-12 NOTE — Telephone Encounter (Signed)
Forwarding to a Freight forwarder to help get insurance information

## 2019-06-12 NOTE — Telephone Encounter (Signed)
I have spoke with him about this in the past

## 2019-06-12 NOTE — Telephone Encounter (Signed)
[name] has previously spoke to patient before about his knee and workers comp. You can use patients insurance if available. Not WC.

## 2019-06-19 NOTE — Telephone Encounter (Signed)
Forwarding to Dr. Adela Ports team

## 2019-06-19 NOTE — Telephone Encounter (Signed)
Please advise 

## 2019-06-19 NOTE — Telephone Encounter (Signed)
I discussed the insurance issue with [name].  I advised that he could not file his private insurance unless he has a denial from [facility].  [name] states that he does have a denial letter from [facility], he will bring this along with his health insurance card to our office.  He would like to hold off on the injection for right now, he would like to have an MRI first.  He feels that if he has a more specific diagnosis (from the MRI) he can possibly appeal the Workers Comp denial. Can Dr. Ninfa Linden or Artis Delay put in an order for the MRI per patient request?  Please advise.

## 2019-06-20 NOTE — Telephone Encounter (Signed)
MRI not necessary for condition.

## 2019-06-20 NOTE — Telephone Encounter (Signed)
He has tricompartmental arthritis not sure why we would get an MRI.

## 2019-06-20 NOTE — Telephone Encounter (Signed)
Mr. Fendley feels like there might be something else going on in his knee other than arthritis.  He might have torn something, or damaged something else.  He would like the MRI to evaluate the knee more closely, to see if anything else can be discovered that would indicate his knee pain/injury happened at work.

## 2019-06-21 ENCOUNTER — Other Ambulatory Visit: Payer: Self-pay | Admitting: Radiology

## 2019-06-21 DIAGNOSIS — M1711 Unilateral primary osteoarthritis, right knee: Secondary | ICD-10-CM

## 2019-06-21 NOTE — Telephone Encounter (Signed)
MRI order of right knee is placed.  Patient will need to bring insurance card for authorization or be self pay.

## 2019-06-21 NOTE — Telephone Encounter (Signed)
Get an MRI of his knee

## 2019-06-22 ENCOUNTER — Encounter: Payer: Self-pay | Admitting: Internal Medicine

## 2019-07-07 ENCOUNTER — Telehealth: Payer: Self-pay | Admitting: Orthopaedic Surgery

## 2019-07-07 NOTE — Telephone Encounter (Signed)
Called patient got recording mailbox is full   could not leave message. Left SMS message with phone number to call back    Patient need MRI review appointment with Dr Joaquin Courts    MRI scheduled for 07/28/2019

## 2019-07-28 ENCOUNTER — Ambulatory Visit
Admission: RE | Admit: 2019-07-28 | Discharge: 2019-07-28 | Disposition: A | Discharge status: Home / Self Care | Payer: Self-pay | Source: Ambulatory Visit | Attending: Physician Assistant | Admitting: Physician Assistant

## 2019-07-28 ENCOUNTER — Other Ambulatory Visit: Payer: Self-pay

## 2019-07-28 DIAGNOSIS — M1711 Unilateral primary osteoarthritis, right knee: Secondary | ICD-10-CM

## 2019-07-28 IMAGING — MR MR KNEE*R* W/O CM
7 series · 37 of 40 positions shown · non-contrast
Comparison: None.

CLINICAL DATA: Right knee pain since [DATE]. Twisting injury
at work. Prior surgeries in [6X] and [6X].

EXAM:
MRI OF THE RIGHT KNEE WITHOUT CONTRAST
TECHNIQUE: Multiplanar, multisequence MR imaging of the knee was performed. No
intravenous contrast was administered.

[Series 6: T2 fat-sat · axial · right · 4.0mm · 0.50mm/px · z∈[-46,+108]mm · 6 of 36 slices shown (1 of 3)]
[im 1/36]
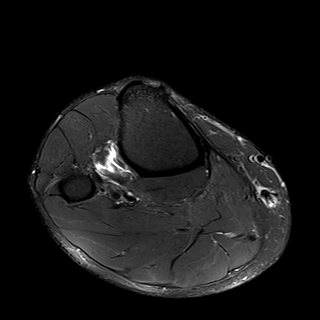
[im 8/36]
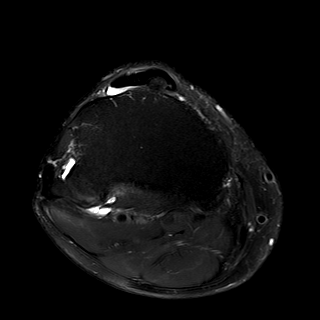
[im 15/36]
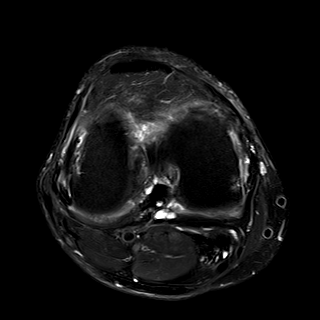
[im 22/36]
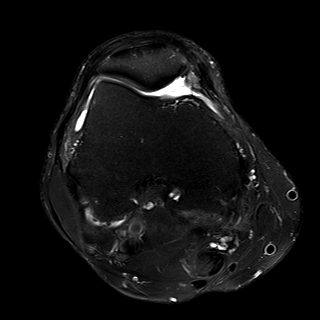
[im 29/36]
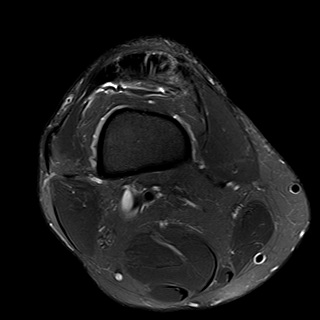
[im 36/36]
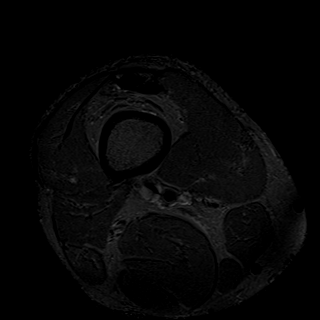

[Series 7: T2 fat-sat · coronal · right · 4.0mm · 0.39mm/px · 6 of 28 slices shown (2 of 3)]
[im 1/28]
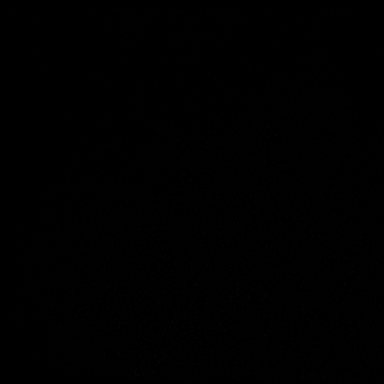
[im 6/28]
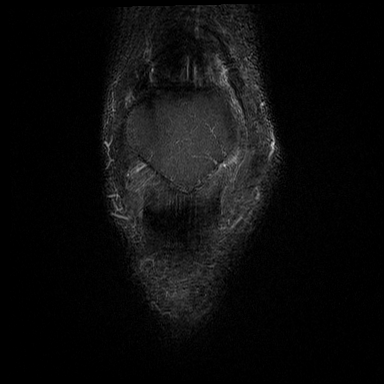
[im 11/28]
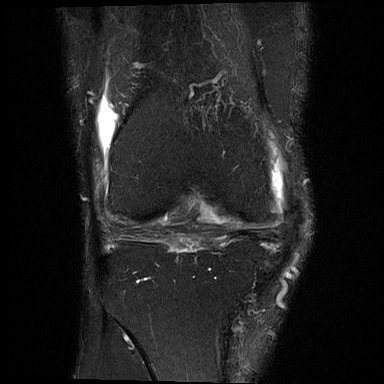
[im 17/28]
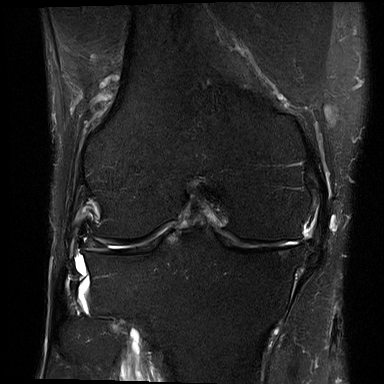
[im 22/28]
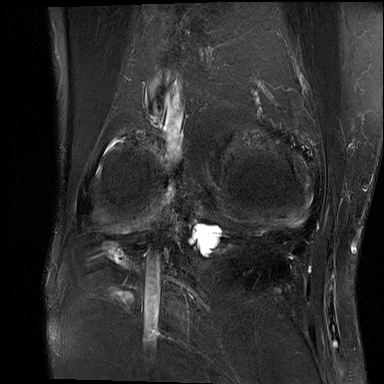
[im 28/28]
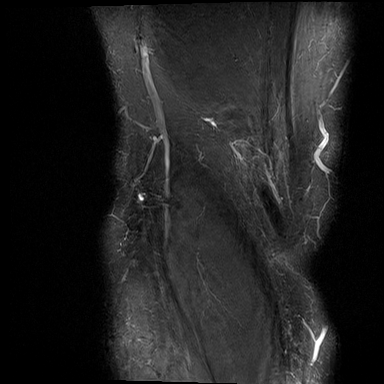

[Series 8: T1 · coronal · right · 4.0mm · 0.39mm/px · 3 of 28 slices shown]
[im 1/28]
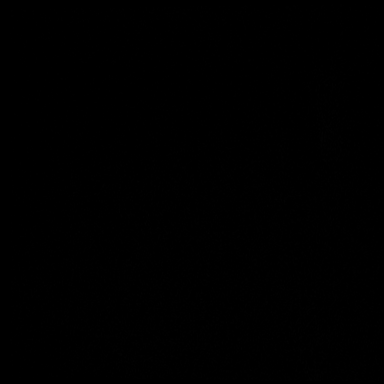
[im 6/28]
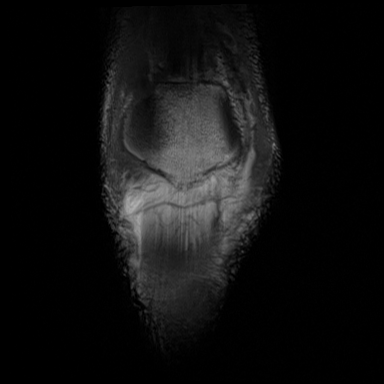
[im 11/28]
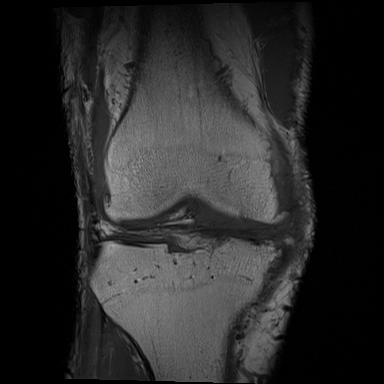

[Series 9: PD fat-sat · coronal · right · 3.0mm · 0.47mm/px · 6 of 28 slices shown (1 of 2)]
[im 1/28]
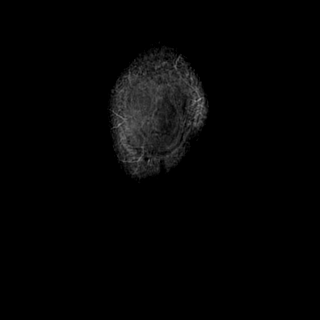
[im 6/28]
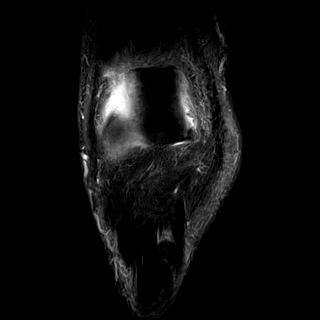
[im 11/28]
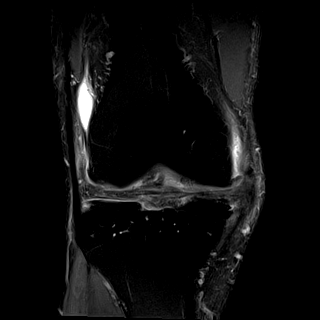
[im 17/28]
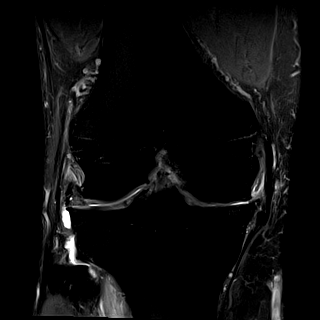
[im 22/28]
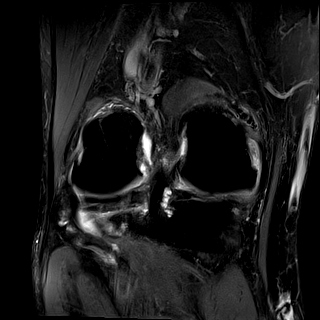
[im 28/28]
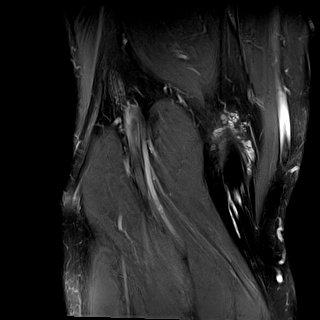

[Series 10: PD fat-sat · sagittal · right · 3.0mm · 0.39mm/px · 6 of 27 slices shown (2 of 2)]
[im 1/27]
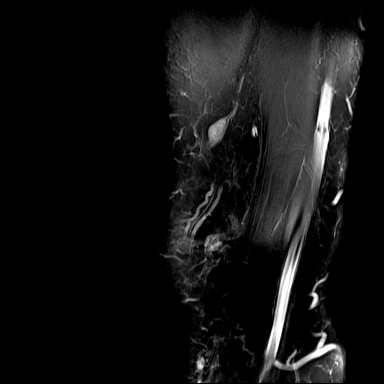
[im 6/27]
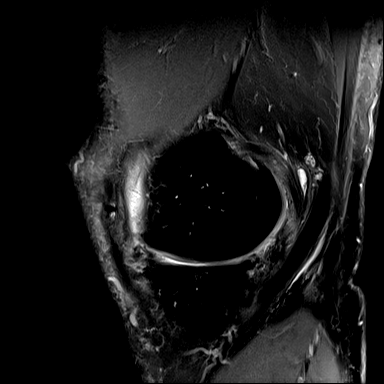
[im 11/27]
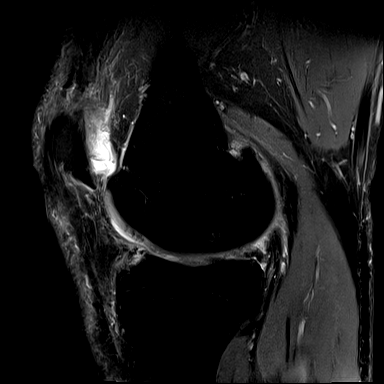
[im 16/27]
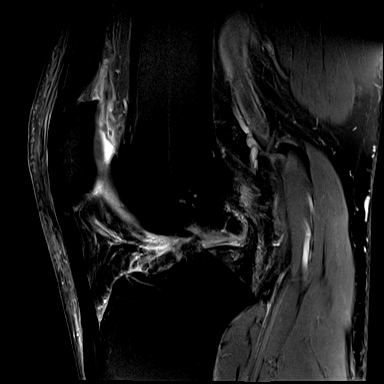
[im 21/27]
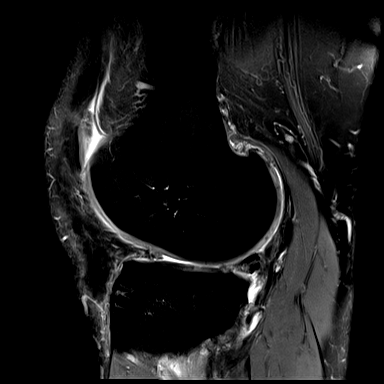
[im 27/27]
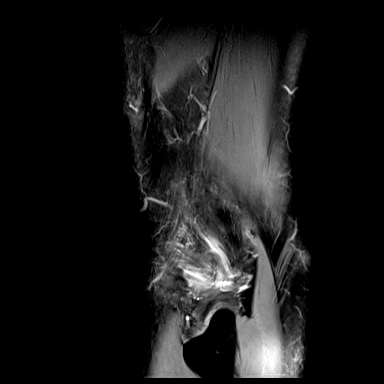

[Series 11: T2 fat-sat · sagittal · right · 3.0mm · 0.39mm/px · 6 of 27 slices shown (3 of 3)]
[im 1/27]
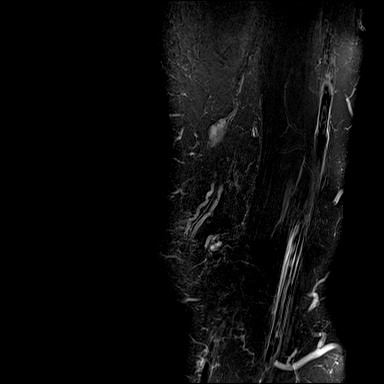
[im 6/27]
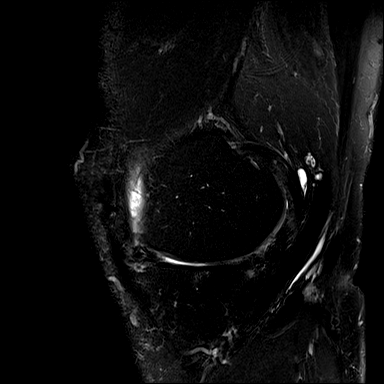
[im 11/27]
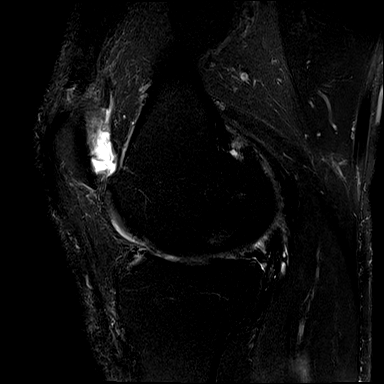
[im 16/27]
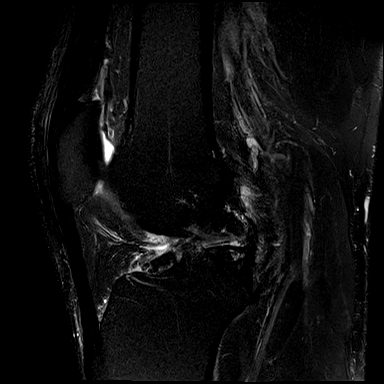
[im 21/27]
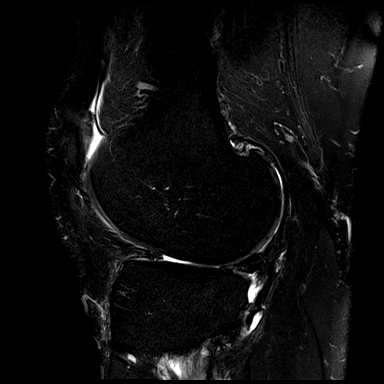
[im 27/27]
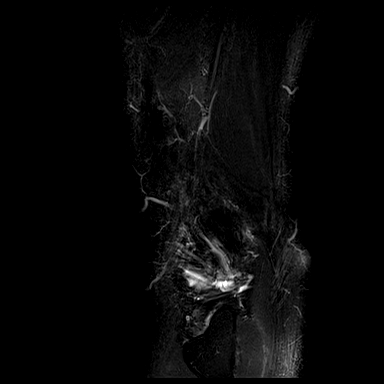

[Series 12: PD · oblique · right · 1.5mm · 0.44mm/px · 4 of 21 slices shown]
[im 1/21]
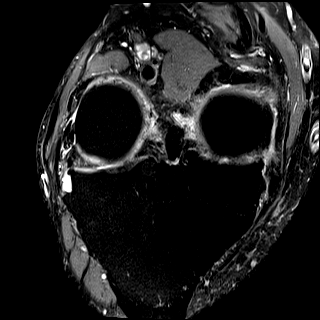
[im 7/21]
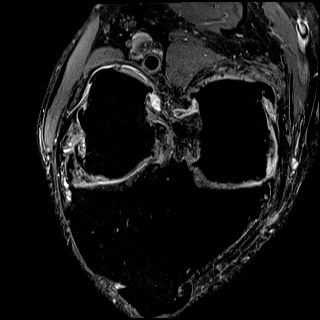
[im 14/21]
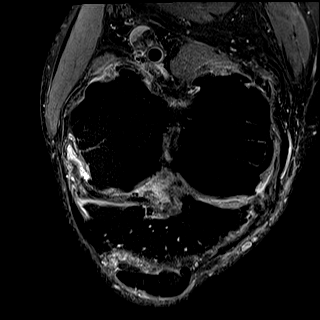
[im 21/21]
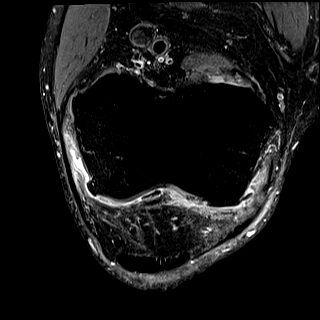

[37 of 40 positions shown; findings below may reference images not displayed]

FINDINGS: MENISCI

Medial meniscus: Attenuation of the entirety of the medial meniscus
which may reflect severe chronic maceration versus prior
meniscectomy.

Lateral meniscus: Maceration of the body and posterior horn of the
lateral meniscus. Severe attenuation of the anterior horn of the
lateral meniscus which may reflect prior meniscectomy.

LIGAMENTS

Cruciates: Intact ACL and PCL. Mildly thickened PCL likely
reflecting prior injury. 14 x 10 mm multiloculated ganglion cyst
along the posterior margin of the distal PCL.

Collaterals: Medial collateral ligament is intact. Lateral
collateral ligament complex is intact.

CARTILAGE

Patellofemoral: No chondral defect of the patella. Focal high-grade
partial-thickness cartilage loss of the trochlear groove.

Medial: High-grade partial-thickness cartilage loss with areas of
full-thickness cartilage loss of the medial femorotibial compartment
with minimal subchondral reactive marrow edema and marginal
osteophytes.

Lateral: Partial thickness cartilage loss of the lateral
femorotibial compartment with small marginal osteophytes.

Joint: Small joint effusion. Thickened suprapatellar plica. Mild
edema in Hoffa's fat.

Popliteal Fossa:  Tiny Baker's cyst.  Intact popliteus tendon.

Extensor Mechanism: Intact quadriceps tendon. Intact patellar
tendon. Intact medial patellar retinaculum. Intact lateral patellar
retinaculum. Intact MPFL.

Bones:  No acute osseous abnormality.  No aggressive osseous lesion.

Other: No fluid collection or hematoma. Muscles are normal. Small
amount of fluid in the deep infrapatellar bursa.
IMPRESSION: 1. Attenuation of the entirety of the medial meniscus which may
reflect severe chronic maceration versus prior meniscectomy.
2. Maceration of the body and posterior horn of the lateral
meniscus. Severe attenuation of the anterior horn of the lateral
meniscus which may reflect prior meniscectomy.
3. Tricompartmental cartilage abnormalities as described above
consistent with osteoarthritis.

## 2019-11-12 ENCOUNTER — Encounter: Payer: Self-pay | Admitting: Emergency Medicine

## 2019-11-12 ENCOUNTER — Ambulatory Visit
Admission: EM | Admit: 2019-11-12 | Discharge: 2019-11-12 | Disposition: A | Discharge status: Home / Self Care | Payer: PRIVATE HEALTH INSURANCE | Attending: Emergency Medicine | Admitting: Emergency Medicine

## 2019-11-12 ENCOUNTER — Other Ambulatory Visit: Payer: Self-pay

## 2019-11-12 DIAGNOSIS — M2550 Pain in unspecified joint: Secondary | ICD-10-CM

## 2019-11-12 MED ORDER — PREDNISONE 10 MG (21) PO TBPK: ORAL_TABLET | Freq: Every day | ORAL | 0 refills | Status: DC

## 2019-11-12 MED ORDER — ACETAMINOPHEN 325 MG PO TABS
650.0000 mg | ORAL_TABLET | Freq: Once | ORAL | Status: AC
  Administered 2019-11-12: 650 mg via ORAL

## 2019-11-12 NOTE — ED Triage Notes (Signed)
Pt sts body aches and joint pain with hx of similar in past; pt sts had infection in his leg one time with similar sx; pt sts has a knot on back of left knee and on left foot

## 2019-11-12 NOTE — Discharge Instructions (Signed)
Steroid as directed with breakfast: 6-5-4-3-2-1 Very important to follow up with rheumatology.

## 2019-11-12 NOTE — ED Provider Notes (Signed)
[name]    CSN: [id number] Arrival date & time: 11/12/19  1341      History   Chief Complaint Chief Complaint  Patient presents with  . Generalized Body Aches    HPI [name] is a 64 y.o. male  With history of polyarthritis presenting for arthralgia flare.  States the last time he had this it was 2 or 3 years ago.  Has tolerated prednisone well in the past and denies recent use thereof.  Denies recent illness, fever, known sick exposures.  Is fully Covid vaccinated.  No change in diet, lifestyle, medications.  Requesting prednisone taper.  Has not been seen by rheumatology.  Past Medical History:  Diagnosis Date  . Erectile dysfunction   . Gout   . Polyarthritis   . Pre-diabetes     Patient Active Problem List   Diagnosis Date Noted  . Prepatellar abscess 12/29/2018  . Cellulitis of right knee 12/28/2018  . Infected prepatellar bursa, right   . Pre-diabetes 07/08/2016  . Elevated blood pressure reading without diagnosis of hypertension 06/01/2016  . Dental caries 05/21/2015  . Thrombocytosis ([place]) 04/24/2015  . Erectile dysfunction 04/23/2015  . Polyarthritis 03/08/2015  . ROTATOR CUFF INJURY, LEFT SHOULDER 06/20/2009    Past Surgical History:  Procedure Laterality Date  . IRRIGATION AND DEBRIDEMENT KNEE Right 12/28/2018   Procedure: IRRIGATION AND DEBRIDEMENT KNEE;  Surgeon: Mcarthur Rossetti, MD;  Location: [facility];  Service: Orthopedics;  Laterality: Right;  . KNEE SURGERY    . ROTATOR CUFF REPAIR    . THUMB ARTHROSCOPY Right 2007       Home Medications    Prior to Admission medications   Medication Sig Start Date End Date Taking? Authorizing Provider  lisinopril (ZESTRIL) 5 MG tablet Take 1 tablet (5 mg total) by mouth daily. 04/20/19   Nicolette Bang, DO  predniSONE (STERAPRED UNI-PAK 21 TAB) 10 MG (21) TBPK tablet Take by mouth daily. Take steroid taper as written 11/12/19   Hall-Potvin, [place], PA-C    Family  History Family History  Problem Relation Age of Onset  . Breast cancer Mother   . Prostate cancer Father   . Hypertension Father   . Lung cancer Son     Social History Social History   Tobacco Use  . Smoking status: Never Smoker  . Smokeless tobacco: Never Used  Vaping Use  . Vaping Use: Never used  Substance Use Topics  . Alcohol use: No    Alcohol/week: 0.0 standard drinks  . Drug use: No     Allergies   Patient has no known allergies.   Review of Systems As per HPI   Physical Exam Triage Vital Signs ED Triage Vitals  Enc Vitals Group     BP      Pulse      Resp      Temp      Temp src      SpO2      Weight      Height      Head Circumference      Peak Flow      Pain Score      Pain Loc      Pain Edu?      Excl. in [place]?    No data found.  Updated Vital Signs BP (!) 148/92 (BP Location: Right Arm)   Pulse 98   Temp 100.1 F (37.8 C) (Oral)   Resp 18   SpO2 96%  Visual Acuity Right Eye Distance:   Left Eye Distance:   Bilateral Distance:    Right Eye Near:   Left Eye Near:    Bilateral Near:     Physical Exam Constitutional:      General: He is not in acute distress. HENT:     Head: Normocephalic and atraumatic.  Eyes:     General: No scleral icterus.    Pupils: Pupils are equal, round, and reactive to light.  Cardiovascular:     Rate and Rhythm: Normal rate.  Pulmonary:     Effort: Pulmonary effort is normal. No respiratory distress.     Breath sounds: No wheezing.  Musculoskeletal:     Comments: Diffuse bony tenderness without swelling, crepitus, deformity in upper and lower extremities.  Skin:    Capillary Refill: Capillary refill takes less than 2 seconds.     Coloration: Skin is not jaundiced or pale.     Findings: No bruising or rash.  Neurological:     Mental Status: He is alert and oriented to person, place, and time.      UC Treatments / Results  Labs (all labs ordered are listed, but only abnormal results are  displayed) Labs Reviewed - No data to display  EKG   Radiology No results found.  Procedures Procedures (including critical care time)  Medications Ordered in UC Medications  acetaminophen (TYLENOL) tablet 650 mg (650 mg Oral Given 11/12/19 1455)    Initial Impression / Assessment and Plan / UC Course  I have reviewed the triage vital signs and the nursing notes.  Pertinent labs & imaging results that were available during my care of the patient were reviewed by me and considered in my medical decision making (see chart for details).     Offered Covid testing given temperature today in office: Declined.  We will treat for likely polyarthritis flare.  Stressed importance of establishing care with rheumatologist for further evaluation and management.  Return precautions discussed, pt verbalized understanding and is agreeable to plan. Final Clinical Impressions(s) / UC Diagnoses   Final diagnoses:  Arthralgia, unspecified joint     Discharge Instructions     Steroid as directed with breakfast: 6-5-4-3-2-1 Very important to follow up with rheumatology.    ED Prescriptions    Medication Sig Dispense Auth. Provider   predniSONE (STERAPRED UNI-PAK 21 TAB) 10 MG (21) TBPK tablet Take by mouth daily. Take steroid taper as written 21 tablet Hall-Potvin, [place], PA-C     PDMP not reviewed this encounter.   Hall-Potvin, [place], [place] 11/12/19 1604

## 2019-11-14 ENCOUNTER — Ambulatory Visit
Admission: EM | Admit: 2019-11-14 | Discharge: 2019-11-14 | Discharge status: Against Medical Advice | Payer: PRIVATE HEALTH INSURANCE

## 2019-11-16 ENCOUNTER — Telehealth: Payer: PRIVATE HEALTH INSURANCE | Admitting: Physician Assistant

## 2019-11-16 DIAGNOSIS — M130 Polyarthritis, unspecified: Secondary | ICD-10-CM | POA: Diagnosis not present

## 2019-11-16 MED ORDER — PREDNISONE 10 MG (21) PO TBPK: ORAL_TABLET | Freq: Every day | ORAL | 0 refills | Status: DC

## 2019-11-16 NOTE — Progress Notes (Signed)
Established Patient Office Visit  Subjective:  Patient ID: [name], male    DOB: 1955-11-27  Age: 64 y.o. MRN: [id number]  CC:  Chief Complaint  Patient presents with  . Medication Refill     Virtual Visit via Telephone Note  I connected with [name] on 11/16/19 at 11:00 AM EDT by telephone and verified that I am speaking with the correct person using two identifiers.  Location: Patient: Work Provider: [facility]    I discussed the limitations, risks, security and privacy concerns of performing an evaluation and management service by telephone and the availability of in person appointments. I also discussed with the patient that there may be a patient responsible charge related to this service. The patient expressed understanding and agreed to proceed.   History of Present Illness: Patient reports that he was seen at urgent care on November 12, 2019 for joint pain.  Reports that he was given a prescription for a steroid taper and unfortunately when he was cleaning out his car he threw it away.  Reports that he never started the medication.  Reports that he has been having a arthritic flare, states that he had this approximately 3 years ago for the first time and was treated with steroids with relief.  Reports that he has had this new onset of allover joint pain for the last 2 weeks, has tried ibuprofen with out much relief.  Denies any recent illnesses, exposure to Covid, fever, changes in diet, trauma or injury.  Reports he is fully vaccinated against COVID-19   Observations/Objective: Medical history and current medications reviewed, no physical exam completed     Past Medical History:  Diagnosis Date  . Erectile dysfunction   . Gout   . Polyarthritis   . Pre-diabetes     Past Surgical History:  Procedure Laterality Date  . IRRIGATION AND DEBRIDEMENT KNEE Right 12/28/2018   Procedure: IRRIGATION AND DEBRIDEMENT KNEE;  Surgeon:  Mcarthur Rossetti, MD;  Location: [facility];  Service: Orthopedics;  Laterality: Right;  . KNEE SURGERY    . ROTATOR CUFF REPAIR    . THUMB ARTHROSCOPY Right 2007    Family History  Problem Relation Age of Onset  . Breast cancer Mother   . Prostate cancer Father   . Hypertension Father   . Lung cancer Son     Social History   Socioeconomic History  . Marital status: Married    Spouse name: Not on file  . Number of children: Not on file  . Years of education: Not on file  . Highest education level: Not on file  Occupational History  . Not on file  Tobacco Use  . Smoking status: Never Smoker  . Smokeless tobacco: Never Used  Vaping Use  . Vaping Use: Never used  Substance and Sexual Activity  . Alcohol use: No    Alcohol/week: 0.0 standard drinks  . Drug use: No  . Sexual activity: Not on file  Other Topics Concern  . Not on file  Social History Narrative  . Not on file   Social Determinants of Health   Financial Resource Strain:   . Difficulty of Paying Living Expenses: Not on file  Food Insecurity:   . Worried About Charity fundraiser in the Last Year: Not on file  . Ran Out of Food in the Last Year: Not on file  Transportation Needs:   . Lack of Transportation (Medical): Not on file  . Lack  of Transportation (Non-Medical): Not on file  Physical Activity:   . Days of Exercise per Week: Not on file  . Minutes of Exercise per Session: Not on file  Stress:   . Feeling of Stress : Not on file  Social Connections:   . Frequency of Communication with Friends and Family: Not on file  . Frequency of Social Gatherings with Friends and Family: Not on file  . Attends Religious Services: Not on file  . Active Member of Clubs or Organizations: Not on file  . Attends Archivist Meetings: Not on file  . Marital Status: Not on file  Intimate Partner Violence:   . Fear of Current or Ex-Partner: Not on file  . Emotionally Abused: Not on file  . Physically  Abused: Not on file  . Sexually Abused: Not on file    Outpatient Medications Prior to Visit  Medication Sig Dispense Refill  . lisinopril (ZESTRIL) 5 MG tablet Take 1 tablet (5 mg total) by mouth daily. 30 tablet 3  . predniSONE (STERAPRED UNI-PAK 21 TAB) 10 MG (21) TBPK tablet Take by mouth daily. Take steroid taper as written 21 tablet 0   No facility-administered medications prior to visit.    No Known Allergies  ROS Review of Systems  Constitutional: Negative for chills, fatigue and fever.  HENT: Negative.   Eyes: Negative.   Respiratory: Negative.   Cardiovascular: Negative.   Gastrointestinal: Negative.   Endocrine: Negative.   Genitourinary: Negative.   Musculoskeletal: Positive for arthralgias. Negative for joint swelling.  Skin: Negative.  Negative for color change.  Allergic/Immunologic: Negative.   Neurological: Negative.   Hematological: Negative.   Psychiatric/Behavioral: Negative.       Objective:     There were no vitals taken for this visit. Wt Readings from Last 3 Encounters:  01/19/19 192 lb (87.1 kg)  12/28/18 200 lb 2.8 oz (90.8 kg)  07/08/16 207 lb (93.9 kg)     Health Maintenance Due  Topic Date Due  . COVID-19 Vaccine (1) Never done  . COLON CANCER SCREENING ANNUAL FOBT  Never done  . INFLUENZA VACCINE  Never done    There are no preventive care reminders to display for this patient.  Lab Results  Component Value Date   TSH 0.647 04/23/2015   Lab Results  Component Value Date   WBC 14.1 (H) 01/04/2019   HGB 13.0 01/04/2019   HCT 39.0 01/04/2019   MCV 93.1 01/04/2019   PLT 420 (H) 01/04/2019   Lab Results  Component Value Date   NA 132 (L) 01/04/2019   K 4.7 01/04/2019   CO2 24 01/04/2019   GLUCOSE 192 (H) 01/04/2019   BUN 22 01/04/2019   CREATININE 0.99 01/04/2019   BILITOT 1.0 01/01/2019   ALKPHOS 78 01/01/2019   AST 36 01/01/2019   ALT 50 (H) 01/01/2019   PROT 7.4 01/01/2019   ALBUMIN 3.3 (L) 01/01/2019   CALCIUM  9.2 01/04/2019   ANIONGAP 10 01/04/2019   No results found for: CHOL No results found for: HDL No results found for: LDLCALC No results found for: TRIG No results found for: CHOLHDL Lab Results  Component Value Date   HGBA1C 5.7 (H) 12/30/2018      Assessment & Plan:   Problem List Items Addressed This Visit      Musculoskeletal and Integument   Polyarthritis - Primary   Relevant Medications   predniSONE (STERAPRED UNI-PAK 21 TAB) 10 MG (21) TBPK tablet  Meds ordered this encounter  Medications  . predniSONE (STERAPRED UNI-PAK 21 TAB) 10 MG (21) TBPK tablet    Sig: Take by mouth daily. Take steroid taper as written    Dispense:  21 tablet    Refill:  0    Patient accidentally threw away last script    Order Specific Question:   Supervising Provider    Answer:   Elsie Stain [[id number]]  Assessment and Plan: 1. Polyarthritis Refilled prednisone taper, patient to return to [facility] In 2 weeks for fasting labs, further review.  On review of patient's chart he does have a history of hypertension, had labs completed in 2017 that were negative for ANA and rheumatoid factor.  Encouraged hydration, rest, gentle stretching, ibuprofen over-the-counter as needed.  The patient was given clear instructions to go to ER  if symptoms don't improve, worsen or new problems develop. The patient verbalized understanding.  No AVS created, patient declined my chart at this time  - predniSONE (STERAPRED UNI-PAK 21 TAB) 10 MG (21) TBPK tablet; Take by mouth daily. Take steroid taper as written  Dispense: 21 tablet; Refill: 0   Follow Up Instructions:    I discussed the assessment and treatment plan with the patient. The patient was provided an opportunity to ask questions and all were answered. The patient agreed with the plan and demonstrated an understanding of the instructions.   The patient was advised to call back or seek an in-person evaluation if the  symptoms worsen or if the condition fails to improve as anticipated.  I provided 21 minutes of non-face-to-face time during this encounter.   Catina Nuss S Ettel Albergo, PA-C    Follow-up: No follow-ups on file.    Loraine Grip Taliya Mcclard, PA-C

## 2019-11-16 NOTE — Progress Notes (Signed)
Patient verified DOB Patient has taken medication today. Patient shares she never used the prednisone. The day he received them he "threw them in a trash" on accident.

## 2019-11-23 ENCOUNTER — Institutional Professional Consult (permissible substitution): Payer: PRIVATE HEALTH INSURANCE | Admitting: Licensed Clinical Social Worker

## 2019-11-29 ENCOUNTER — Telehealth: Payer: Self-pay

## 2019-11-29 NOTE — Telephone Encounter (Signed)
Contacted patient regarding updates to the [facility] schedule and rescheduling appointment for a different location. LVM for patient to call the office back.

## 2019-11-30 ENCOUNTER — Other Ambulatory Visit: Payer: Self-pay

## 2019-11-30 ENCOUNTER — Ambulatory Visit (INDEPENDENT_AMBULATORY_CARE_PROVIDER_SITE_OTHER): Payer: PRIVATE HEALTH INSURANCE | Admitting: Physician Assistant

## 2019-11-30 ENCOUNTER — Ambulatory Visit: Payer: PRIVATE HEALTH INSURANCE

## 2019-11-30 VITALS — BP 163/77 | HR 86 | Temp 98.3°F | Resp 18 | Ht 67.5 in | Wt 189.0 lb

## 2019-11-30 DIAGNOSIS — M130 Polyarthritis, unspecified: Secondary | ICD-10-CM | POA: Diagnosis not present

## 2019-11-30 DIAGNOSIS — R222 Localized swelling, mass and lump, trunk: Secondary | ICD-10-CM | POA: Diagnosis not present

## 2019-11-30 DIAGNOSIS — Z114 Encounter for screening for human immunodeficiency virus [HIV]: Secondary | ICD-10-CM

## 2019-11-30 DIAGNOSIS — Z13220 Encounter for screening for lipoid disorders: Secondary | ICD-10-CM

## 2019-11-30 DIAGNOSIS — R7303 Prediabetes: Secondary | ICD-10-CM

## 2019-11-30 DIAGNOSIS — I10 Essential (primary) hypertension: Secondary | ICD-10-CM

## 2019-11-30 DIAGNOSIS — Z1159 Encounter for screening for other viral diseases: Secondary | ICD-10-CM

## 2019-11-30 DIAGNOSIS — Z125 Encounter for screening for malignant neoplasm of prostate: Secondary | ICD-10-CM

## 2019-11-30 DIAGNOSIS — Z1211 Encounter for screening for malignant neoplasm of colon: Secondary | ICD-10-CM

## 2019-11-30 HISTORY — DX: Essential (primary) hypertension: I10

## 2019-11-30 MED ORDER — LISINOPRIL 5 MG PO TABS: 5.0000 mg | ORAL_TABLET | Freq: Every day | ORAL | 3 refills | Status: DC

## 2019-11-30 MED ORDER — MELOXICAM 15 MG PO TABS: 15.0000 mg | ORAL_TABLET | Freq: Every day | ORAL | 0 refills | Status: DC

## 2019-11-30 NOTE — Patient Instructions (Signed)
You will start lisinopril 5 mg once a day for your elevated blood pressure.  I encourage you to check your blood pressure on a daily basis, keep a written log and bring it with you for your follow-up visit  You can use Mobic 15 mg once a day to help you with your pain.  Your A1c was 6.3, which is considered prediabetes.  I encourage you to work diligently on a lower sugar diet.  I will start a referral for you to obtain a colonoscopy as well as an evaluation by general surgery for the nodule on your lower left back.  Please follow-up in the mobile medicine unit in 2 weeks.  Austin Rad, PA-C Physician Assistant [facility] [email or web address]   Preventing Type 2 Diabetes Mellitus Type 2 diabetes (type 2 diabetes mellitus) is a long-term (chronic) disease that affects blood sugar (glucose) levels. Normally, a hormone called insulin allows glucose to enter cells in the body. The cells use glucose for energy. In type 2 diabetes, one or both of these problems may be present:  The body does not make enough insulin.  The body does not respond properly to insulin that it makes (insulin resistance). Insulin resistance or lack of insulin causes excess glucose to build up in the blood instead of going into cells. As a result, high blood glucose (hyperglycemia) develops, which can cause many complications. Being overweight or obese and having an inactive (sedentary) lifestyle can increase your risk for diabetes. Type 2 diabetes can be delayed or prevented by making certain nutrition and lifestyle changes. What nutrition changes can be made?   Eat healthy meals and snacks regularly. Keep a healthy snack with you for when you get hungry between meals, such as fruit or a handful of nuts.  Eat lean meats and proteins that are low in saturated fats, such as chicken, fish, egg whites, and beans. Avoid processed meats.  Eat plenty of fruits and  vegetables and plenty of grains that have not been processed (whole grains). It is recommended that you eat: ? 1?2 cups of fruit every day. ? 2?3 cups of vegetables every day. ? 6?8 oz of whole grains every day, such as oats, whole wheat, bulgur, brown rice, quinoa, and millet.  Eat low-fat dairy products, such as milk, yogurt, and cheese.  Eat foods that contain healthy fats, such as nuts, avocado, olive oil, and canola oil.  Drink water throughout the day. Avoid drinks that contain added sugar, such as soda or sweet tea.  Follow instructions from your health care provider about specific eating or drinking restrictions.  Control how much food you eat at a time (portion size). ? Check food labels to find out the serving sizes of foods. ? Use a kitchen scale to weigh amounts of foods.  Saute or steam food instead of frying it. Cook with water or broth instead of oils or butter.  Limit your intake of: ? Salt (sodium). Have no more than 1 tsp (2,400 mg) of sodium a day. If you have heart disease or high blood pressure, have less than ? tsp (1,500 mg) of sodium a day. ? Saturated fat. This is fat that is solid at room temperature, such as butter or fat on meat. What lifestyle changes can be made? Activity   Do moderate-intensity physical activity for at least 30 minutes on at least 5 days of the week, or as much as told by your health care provider.  Ask your health care provider  what activities are safe for you. A mix of physical activities may be best, such as walking, swimming, cycling, and strength training.  Try to add physical activity into your day. For example: ? Park in spots that are farther away than usual, so that you walk more. For example, park in a far corner of the parking lot when you go to the office or the grocery store. ? Take a walk during your lunch break. ? Use stairs instead of elevators or escalators. Weight Loss  Lose weight as directed. Your health care  provider can determine how much weight loss is best for you and can help you lose weight safely.  If you are overweight or obese, you may be instructed to lose at least 5?7 % of your body weight. Alcohol and Tobacco   Limit alcohol intake to no more than 1 drink a day for nonpregnant women and 2 drinks a day for men. One drink equals 12 oz of beer, 5 oz of wine, or 1 oz of hard liquor.  Do not use any tobacco products, such as cigarettes, chewing tobacco, and e-cigarettes. If you need help quitting, ask your health care provider. Work With [place] [facility] Provider  Have your blood glucose tested regularly, as told by your health care provider.  Discuss your risk factors and how you can reduce your risk for diabetes.  Get screening tests as told by your health care provider. You may have screening tests regularly, especially if you have certain risk factors for type 2 diabetes.  Make an appointment with a diet and nutrition specialist (registered dietitian). A registered dietitian can help you make a healthy eating plan and can help you understand portion sizes and food labels. Why are these changes important?  It is possible to prevent or delay type 2 diabetes and related health problems by making lifestyle and nutrition changes.  It can be difficult to recognize signs of type 2 diabetes. The best way to avoid possible damage to your body is to take actions to prevent the disease before you develop symptoms. What can happen if changes are not made?  Your blood glucose levels may keep increasing. Having high blood glucose for a long time is dangerous. Too much glucose in your blood can damage your blood vessels, heart, kidneys, nerves, and eyes.  You may develop prediabetes or type 2 diabetes. Type 2 diabetes can lead to many chronic health problems and complications, such as: ? Heart disease. ? Stroke. ? Blindness. ? Kidney disease. ? Depression. ? Poor circulation in the feet and  legs, which could lead to surgical removal (amputation) in severe cases. Where to find support  Ask your health care provider to recommend a registered dietitian, diabetes educator, or weight loss program.  Look for local or online weight loss groups.  Join a gym, fitness club, or outdoor activity group, such as a walking club. Where to find more information To learn more about diabetes and diabetes prevention, visit:  American Diabetes Association (ADA): www.diabetes.[employer] of Diabetes and Digestive and Kidney Diseases: FindSpin.nl To learn more about healthy eating, visit:  The U.S. Department of Agriculture Scientist, research (physical sciences)), Choose My Plate: http://wiley-williams.com/  Office of Disease Prevention and Health Promotion (ODPHP), Dietary Guidelines: SurferLive.at Summary  You can reduce your risk for type 2 diabetes by increasing your physical activity, eating healthy foods, and losing weight as directed.  Talk with your health care provider about your risk for type 2 diabetes. Ask about any  blood tests or screening tests that you need to have. This information is not intended to replace advice given to you by your health care provider. Make sure you discuss any questions you have with your health care provider. Document Revised: 06/10/2018 Document Reviewed: 04/09/2015 Elsevier Patient Education  [place] refers to food and lifestyle choices that are based on the traditions of countries located on the [employer]. This way of eating has been shown to help prevent certain conditions and improve outcomes for people who have chronic diseases, like kidney disease and heart disease. What are tips for following this plan? Lifestyle  Cook and eat meals together with your family, when possible.  Drink enough fluid to keep your urine clear or pale yellow.  Be  physically active every day. This includes: ? Aerobic exercise like running or swimming. ? Leisure activities like gardening, walking, or housework.  Get 7-8 hours of sleep each night.  If recommended by your health care provider, drink red wine in moderation. This means 1 glass a day for nonpregnant women and 2 glasses a day for men. A glass of wine equals 5 oz (150 mL). Reading food labels   Check the serving size of packaged foods. For foods such as rice and pasta, the serving size refers to the amount of cooked product, not dry.  Check the total fat in packaged foods. Avoid foods that have saturated fat or trans fats.  Check the ingredients list for added sugars, such as corn syrup. Shopping  At the grocery store, buy most of your food from the areas near the walls of the store. This includes: ? Fresh fruits and vegetables (produce). ? Grains, beans, nuts, and seeds. Some of these may be available in unpackaged forms or large amounts (in bulk). ? Fresh seafood. ? Poultry and eggs. ? Low-fat dairy products.  Buy whole ingredients instead of prepackaged foods.  Buy fresh fruits and vegetables in-season from local farmers markets.  Buy frozen fruits and vegetables in resealable bags.  If you do not have access to quality fresh seafood, buy precooked frozen shrimp or canned fish, such as tuna, salmon, or sardines.  Buy small amounts of raw or cooked vegetables, salads, or olives from the deli or salad bar at your store.  Stock your pantry so you always have certain foods on hand, such as olive oil, canned tuna, canned tomatoes, rice, pasta, and beans. Cooking  Cook foods with extra-virgin olive oil instead of using butter or other vegetable oils.  Have meat as a side dish, and have vegetables or grains as your main dish. This means having meat in small portions or adding small amounts of meat to foods like pasta or stew.  Use beans or vegetables instead of meat in common  dishes like chili or lasagna.  Experiment with different cooking methods. Try roasting or broiling vegetables instead of steaming or sauteing them.  Add frozen vegetables to soups, stews, pasta, or rice.  Add nuts or seeds for added healthy fat at each meal. You can add these to yogurt, salads, or vegetable dishes.  Marinate fish or vegetables using olive oil, lemon juice, garlic, and fresh herbs. Meal planning   Plan to eat 1 vegetarian meal one day each week. Try to work up to 2 vegetarian meals, if possible.  Eat seafood 2 or more times a week.  Have healthy snacks readily available, such as: ? Vegetable sticks with hummus. ? [place]  yogurt. ? Fruit and nut trail mix.  Eat balanced meals throughout the week. This includes: ? Fruit: 2-3 servings a day ? Vegetables: 4-5 servings a day ? Low-fat dairy: 2 servings a day ? Fish, poultry, or lean meat: 1 serving a day ? Beans and legumes: 2 or more servings a week ? Nuts and seeds: 1-2 servings a day ? Whole grains: 6-8 servings a day ? Extra-virgin olive oil: 3-4 servings a day  Limit red meat and sweets to only a few servings a month What are my food choices?  Mediterranean diet ? Recommended  Grains: Whole-grain pasta. Brown rice. Bulgar wheat. Polenta. Couscous. Whole-wheat bread. [place].  Vegetables: Artichokes. Beets. Broccoli. Cabbage. Carrots. Eggplant. Green beans. Chard. Kale. Spinach. Onions. Leeks. Peas. Squash. Tomatoes. Peppers. Radishes.  Fruits: Apples. Apricots. Avocado. Berries. Bananas. Cherries. Dates. Figs. Grapes. Lemons. Melon. Oranges. Peaches. Plums. Pomegranate.  Meats and other protein foods: Beans. Almonds. Sunflower seeds. Pine nuts. Peanuts. [place]. Salmon. Scallops. Shrimp. [place]. Tilapia. Clams. Oysters. Eggs.  Dairy: Low-fat milk. Cheese. Greek yogurt.  Beverages: Water. Red wine. Herbal tea.  Fats and oils: Extra virgin olive oil. Avocado oil. Grape seed oil.  Sweets and desserts:  [place] yogurt with honey. Baked apples. Poached pears. Trail mix.  Seasoning and other foods: Basil. Cilantro. Coriander. Cumin. Mint. Parsley. Sage. Rosemary. Tarragon. Garlic. Oregano. Thyme. Pepper. Balsalmic vinegar. Tahini. Hummus. Tomato sauce. Olives. Mushrooms. ? Limit these  Grains: Prepackaged pasta or rice dishes. Prepackaged cereal with added sugar.  Vegetables: Deep fried potatoes (french fries).  Fruits: Fruit canned in syrup.  Meats and other protein foods: Beef. Pork. Lamb. Poultry with skin. Hot dogs. Berniece Salines.  Dairy: Ice cream. Sour cream. Whole milk.  Beverages: Juice. Sugar-sweetened soft drinks. Beer. Liquor and spirits.  Fats and oils: Butter. Canola oil. Vegetable oil. Beef fat (tallow). Lard.  Sweets and desserts: Cookies. Cakes. Pies. Candy.  Seasoning and other foods: Mayonnaise. Premade sauces and marinades. The items listed may not be a complete list. Talk with your dietitian about what dietary choices are right for you. Summary  The Mediterranean diet includes both food and lifestyle choices.  Eat a variety of fresh fruits and vegetables, beans, nuts, seeds, and whole grains.  Limit the amount of red meat and sweets that you eat.  Talk with your health care provider about whether it is safe for you to drink red wine in moderation. This means 1 glass a day for nonpregnant women and 2 glasses a day for men. A glass of wine equals 5 oz (150 mL). This information is not intended to replace advice given to you by your health care provider. Make sure you discuss any questions you have with your health care provider. Document Revised: 10/17/2015 Document Reviewed: 10/10/2015 Elsevier Patient Education  Welcome.

## 2019-11-30 NOTE — Progress Notes (Signed)
Established Patient Office Visit  Subjective:  Patient ID: [name], male    DOB: 1956-02-28  Age: 64 y.o. MRN: [id number]  CC:  Chief Complaint  Patient presents with  . Follow-up    HPI [name] reports that he continues to have all over joint pain despite finishing the prednisone taper.  Reports that he did have 1 day after taking the steroid taper with some relief. Reports he has been taking ibuprofen 800 mg without relief.   Reports that he has lost 10 pounds since February 2021 without effort.  Reports appetite remains the same, no changes.  Reports that he has never taken any medications to control his blood pressure, states that he has also previously been prescribed medications for gout but was unable to afford them.  Reports that he has a nodule on his lower left back that has been present for at least the past 15 years, states that it seems to be growing, states that it is uncomfortable, denies any discharge.   Past Medical History:  Diagnosis Date  . Erectile dysfunction   . Essential hypertension 11/30/2019  . Gout   . Infected prepatellar bursa, right   . Polyarthritis   . Pre-diabetes     Past Surgical History:  Procedure Laterality Date  . IRRIGATION AND DEBRIDEMENT KNEE Right 12/28/2018   Procedure: IRRIGATION AND DEBRIDEMENT KNEE;  Surgeon: Mcarthur Rossetti, MD;  Location: [facility];  Service: Orthopedics;  Laterality: Right;  . KNEE SURGERY    . ROTATOR CUFF REPAIR    . THUMB ARTHROSCOPY Right 2007    Family History  Problem Relation Age of Onset  . Breast cancer Mother   . Prostate cancer Father   . Hypertension Father   . Lung cancer Son     Social History   Socioeconomic History  . Marital status: Married    Spouse name: Not on file  . Number of children: Not on file  . Years of education: Not on file  . Highest education level: Not on file  Occupational History  . Not on file  Tobacco Use  . Smoking status: Never  Smoker  . Smokeless tobacco: Never Used  Vaping Use  . Vaping Use: Never used  Substance and Sexual Activity  . Alcohol use: No    Alcohol/week: 0.0 standard drinks  . Drug use: No  . Sexual activity: Not Currently  Other Topics Concern  . Not on file  Social History Narrative  . Not on file   Social Determinants of Health   Financial Resource Strain:   . Difficulty of Paying Living Expenses: Not on file  Food Insecurity:   . Worried About Charity fundraiser in the Last Year: Not on file  . Ran Out of Food in the Last Year: Not on file  Transportation Needs:   . Lack of Transportation (Medical): Not on file  . Lack of Transportation (Non-Medical): Not on file  Physical Activity:   . Days of Exercise per Week: Not on file  . Minutes of Exercise per Session: Not on file  Stress:   . Feeling of Stress : Not on file  Social Connections:   . Frequency of Communication with Friends and Family: Not on file  . Frequency of Social Gatherings with Friends and Family: Not on file  . Attends Religious Services: Not on file  . Active Member of Clubs or Organizations: Not on file  . Attends Archivist Meetings: Not on file  .  Marital Status: Not on file  Intimate Partner Violence:   . Fear of Current or Ex-Partner: Not on file  . Emotionally Abused: Not on file  . Physically Abused: Not on file  . Sexually Abused: Not on file    Outpatient Medications Prior to Visit  Medication Sig Dispense Refill  . lisinopril (ZESTRIL) 5 MG tablet Take 1 tablet (5 mg total) by mouth daily. (Patient not taking: Reported on 11/30/2019) 30 tablet 3  . predniSONE (STERAPRED UNI-PAK 21 TAB) 10 MG (21) TBPK tablet Take by mouth daily. Take steroid taper as written 21 tablet 0   No facility-administered medications prior to visit.    No Known Allergies  ROS Review of Systems  Constitutional: Negative for chills and fever.  HENT: Negative.   Eyes: Negative.   Respiratory: Negative.     Cardiovascular: Negative.   Gastrointestinal: Negative for nausea and vomiting.  Endocrine: Negative.   Genitourinary: Negative.   Musculoskeletal: Positive for arthralgias and myalgias. Negative for joint swelling.  Skin: Negative.   Allergic/Immunologic: Negative.   Neurological: Negative.   Hematological: Negative.   Psychiatric/Behavioral: Negative for sleep disturbance.      Objective:    Physical Exam Vitals and nursing note reviewed.  Constitutional:      Appearance: Normal appearance.  HENT:     Head: Normocephalic and atraumatic.     Right Ear: Tympanic membrane, ear canal and external ear normal.     Left Ear: Tympanic membrane, ear canal and external ear normal.     Nose: Nose normal.     Mouth/Throat:     Mouth: Mucous membranes are moist.     Pharynx: Oropharynx is clear.  Eyes:     Extraocular Movements: Extraocular movements intact.     Conjunctiva/sclera: Conjunctivae normal.     Pupils: Pupils are equal, round, and reactive to light.  Cardiovascular:     Rate and Rhythm: Normal rate and regular rhythm.     Pulses: Normal pulses.     Heart sounds: Normal heart sounds.  Pulmonary:     Effort: Pulmonary effort is normal.     Breath sounds: Normal breath sounds.  Abdominal:     General: Abdomen is flat.     Palpations: Abdomen is soft.  Musculoskeletal:     Right shoulder: Normal.     Left shoulder: Normal.     Right upper arm: Normal.     Left upper arm: Normal.     Right elbow: Normal.     Left elbow: Normal.     Right forearm: Normal.     Left forearm: Normal.     Right hand: Normal.     Left hand: Normal.     Cervical back: Normal, normal range of motion and neck supple.     Thoracic back: Normal.     Lumbar back: Normal.     Right knee: Crepitus present.     Left knee: Crepitus present.     Right lower leg: Normal.     Left lower leg: Normal.     Right foot: Normal.     Left foot: Normal.  Skin:    General: Skin is warm and dry.        Neurological:     General: No focal deficit present.     Mental Status: He is alert and oriented to person, place, and time.  Psychiatric:        Mood and Affect: Mood normal.  Behavior: Behavior normal.        Thought Content: Thought content normal.        Judgment: Judgment normal.     BP (!) 163/77 (BP Location: Left Arm, Patient Position: Sitting, Cuff Size: Normal)   Pulse 86   Temp 98.3 F (36.8 C) (Oral)   Resp 18   Ht 5' 7.5" (1.715 m)   Wt 189 lb (85.7 kg)   SpO2 95%   BMI 29.16 kg/m  Wt Readings from Last 3 Encounters:  11/30/19 189 lb (85.7 kg)  01/19/19 192 lb (87.1 kg)  12/28/18 200 lb 2.8 oz (90.8 kg)     Health Maintenance Due  Topic Date Due  . COLON CANCER SCREENING ANNUAL FOBT  Never done  . INFLUENZA VACCINE  Never done    There are no preventive care reminders to display for this patient.  Lab Results  Component Value Date   TSH 0.647 04/23/2015   Lab Results  Component Value Date   WBC 14.1 (H) 01/04/2019   HGB 13.0 01/04/2019   HCT 39.0 01/04/2019   MCV 93.1 01/04/2019   PLT 420 (H) 01/04/2019   Lab Results  Component Value Date   NA 132 (L) 01/04/2019   K 4.7 01/04/2019   CO2 24 01/04/2019   GLUCOSE 192 (H) 01/04/2019   BUN 22 01/04/2019   CREATININE 0.99 01/04/2019   BILITOT 1.0 01/01/2019   ALKPHOS 78 01/01/2019   AST 36 01/01/2019   ALT 50 (H) 01/01/2019   PROT 7.4 01/01/2019   ALBUMIN 3.3 (L) 01/01/2019   CALCIUM 9.2 01/04/2019   ANIONGAP 10 01/04/2019   No results found for: CHOL No results found for: HDL No results found for: LDLCALC No results found for: TRIG No results found for: CHOLHDL Lab Results  Component Value Date   HGBA1C 5.7 (H) 12/30/2018      Assessment & Plan:   Problem List Items Addressed This Visit      Cardiovascular and Mediastinum   Essential hypertension - Primary   Relevant Medications   lisinopril (ZESTRIL) 5 MG tablet   Other Relevant Orders   TSH   CBC with  Differential/Platelet   Comp. Metabolic Panel (12)   Vitamin D, 25-hydroxy     Musculoskeletal and Integument   Polyarthritis   Relevant Medications   meloxicam (MOBIC) 15 MG tablet   Other Relevant Orders   TSH   CBC with Differential/Platelet   Comp. Metabolic Panel (12)   Vitamin D, 25-hydroxy   Uric Acid     Other   Prediabetes   Subcutaneous nodule of back   Relevant Orders   Ambulatory referral to General Surgery    Other Visit Diagnoses    Screening, lipid       Relevant Orders   Lipid panel   Encounter for HCV screening test for low risk patient       Relevant Orders   HCV Ab w/Rflx to Verification   Screening for HIV without presence of risk factors       Relevant Orders   HIV antibody (with reflex)   Screening PSA (prostate specific antigen)       Relevant Orders   PSA   Screening for colon cancer       Relevant Orders   Ambulatory referral to Gastroenterology    1. Essential hypertension Patient agreeable to begin lisinopril 5 mg once daily, encouraged to check blood pressure on a daily basis, keep a written log and bring to  next office visit.  Patient agrees to follow-up in the mobile medicine unit in 2 weeks to review lab results and further evaluation of hypertension. - TSH - CBC with Differential/Platelet - Comp. Metabolic Panel (12) - lisinopril (ZESTRIL) 5 MG tablet; Take 1 tablet (5 mg total) by mouth daily.  Dispense: 30 tablet; Refill: 3 - Vitamin D, 25-hydroxy  2. Polyarthritis Reviewed labs from 2017 with patient, patient was negative for ANA and rheumatoid arthritis factor.  Encouraged patient to work on anti-inflammatory diet, trial Mobic, stretching, hydration - TSH - CBC with Differential/Platelet - Comp. Metabolic Panel (12) - meloxicam (MOBIC) 15 MG tablet; Take 1 tablet (15 mg total) by mouth daily.  Dispense: 30 tablet; Refill: 0 - Vitamin D, 25-hydroxy - Uric Acid  3. Prediabetes A1c was 6.3, patient education given on lifestyle  modifications, patient wants to work on lifestyle modifications for the next 3 months prior to trial of that  4. Subcutaneous nodule of back  - Ambulatory referral to General Surgery  5. Screening, lipid  - Lipid panel  6. Encounter for HCV screening test for low risk patient  - HCV Ab w/Rflx to Verification  7. Screening for HIV without presence of risk factors  - HIV antibody (with reflex)  8. Screening PSA (prostate specific antigen)  - PSA  9. Screening for colon cancer  - Ambulatory referral to Gastroenterology   Meds ordered this encounter  Medications  . lisinopril (ZESTRIL) 5 MG tablet    Sig: Take 1 tablet (5 mg total) by mouth daily.    Dispense:  30 tablet    Refill:  3    Order Specific Question:   Supervising Provider    Answer:   Joya Gaskins, PATRICK E [[id number]]  . meloxicam (MOBIC) 15 MG tablet    Sig: Take 1 tablet (15 mg total) by mouth daily.    Dispense:  30 tablet    Refill:  0    Order Specific Question:   Supervising Provider    Answer:   Elsie Stain [[id number]]    I have reviewed the patient's medical history (PMH, PSH, Social History, Family History, Medications, and allergies) , and have been updated if relevant. I spent 40 minutes reviewing chart and  face to face time with patient.    Follow-up: No follow-ups on file.    Loraine Grip Mayers, PA-C

## 2019-12-01 LAB — COMP. METABOLIC PANEL (12)
AST: 16 IU/L (ref 0–40)
Albumin/Globulin Ratio: 1.5 (ref 1.2–2.2)
Albumin: 4.5 g/dL (ref 3.8–4.8)
Alkaline Phosphatase: 119 IU/L (ref 44–121)
BUN/Creatinine Ratio: 8 — ABNORMAL LOW (ref 10–24)
BUN: 7 mg/dL — ABNORMAL LOW (ref 8–27)
Bilirubin Total: 0.7 mg/dL (ref 0.0–1.2)
Calcium: 9.7 mg/dL (ref 8.6–10.2)
Chloride: 102 mmol/L (ref 96–106)
Creatinine, Ser: 0.92 mg/dL (ref 0.76–1.27)
GFR calc Af Amer: 101 mL/min/{1.73_m2} (ref >59)
GFR calc non Af Amer: 88 mL/min/{1.73_m2} (ref >59)
Globulin, Total: 3 g/dL (ref 1.5–4.5)
Glucose: 103 mg/dL — ABNORMAL HIGH (ref 65–99)
Potassium: 4.3 mmol/L (ref 3.5–5.2)
Sodium: 139 mmol/L (ref 134–144)
Total Protein: 7.5 g/dL (ref 6.0–8.5)

## 2019-12-01 LAB — CBC WITH DIFFERENTIAL/PLATELET
Basophils Absolute: 0.1 10*3/uL (ref 0.0–0.2)
Basos: 1 %
EOS (ABSOLUTE): 0.1 10*3/uL (ref 0.0–0.4)
Eos: 1 %
Hematocrit: 41.2 % (ref 37.5–51.0)
Hemoglobin: 14.1 g/dL (ref 13.0–17.7)
Immature Grans (Abs): 0 10*3/uL (ref 0.0–0.1)
Immature Granulocytes: 0 %
Lymphocytes Absolute: 1.4 10*3/uL (ref 0.7–3.1)
Lymphs: 12 %
MCH: 30.3 pg (ref 26.6–33.0)
MCHC: 34.2 g/dL (ref 31.5–35.7)
MCV: 89 fL (ref 79–97)
Monocytes Absolute: 0.8 10*3/uL (ref 0.1–0.9)
Monocytes: 7 %
Neutrophils Absolute: 8.8 10*3/uL — ABNORMAL HIGH (ref 1.4–7.0)
Neutrophils: 79 %
Platelets: 317 10*3/uL (ref 150–450)
RBC: 4.65 x10E6/uL (ref 4.14–5.80)
RDW: 12.5 % (ref 11.6–15.4)
WBC: 11.2 10*3/uL — ABNORMAL HIGH (ref 3.4–10.8)

## 2019-12-01 LAB — TSH: TSH: 0.336 u[IU]/mL — ABNORMAL LOW (ref 0.450–4.500)

## 2019-12-01 LAB — VITAMIN D 25 HYDROXY (VIT D DEFICIENCY, FRACTURES): Vit D, 25-Hydroxy: 19.4 ng/mL — ABNORMAL LOW (ref 30.0–100.0)

## 2019-12-01 LAB — HIV ANTIBODY (ROUTINE TESTING W REFLEX): HIV Screen 4th Generation wRfx: NONREACTIVE

## 2019-12-01 LAB — PSA: Prostate Specific Ag, Serum: 6.4 ng/mL — ABNORMAL HIGH (ref 0.0–4.0)

## 2019-12-01 LAB — LIPID PANEL
Chol/HDL Ratio: 3.3 ratio (ref 0.0–5.0)
Cholesterol, Total: 193 mg/dL (ref 100–199)
HDL: 59 mg/dL (ref >39)
LDL Chol Calc (NIH): 117 mg/dL — ABNORMAL HIGH (ref 0–99)
Triglycerides: 93 mg/dL (ref 0–149)
VLDL Cholesterol Cal: 17 mg/dL (ref 5–40)

## 2019-12-01 LAB — HCV AB W/RFLX TO VERIFICATION: HCV Ab: <0.1 s/co ratio (ref 0.0–0.9)

## 2019-12-01 LAB — HCV INTERPRETATION

## 2019-12-01 LAB — URIC ACID: Uric Acid: 5.6 mg/dL (ref 3.8–8.4)

## 2019-12-04 ENCOUNTER — Telehealth: Payer: Self-pay | Admitting: *Deleted

## 2019-12-04 NOTE — Telephone Encounter (Signed)
MA unable to reach patient x2. VM is full.

## 2019-12-04 NOTE — Telephone Encounter (Signed)
-----   Message from Kennieth Rad, [place] sent at 12/04/2019  6:10 AM EDT ----- Please remind patient to keep his appointment to review labs on the mobile unit

## 2019-12-06 ENCOUNTER — Telehealth: Payer: Self-pay | Admitting: *Deleted

## 2019-12-06 NOTE — Telephone Encounter (Signed)
MA unable to reach patient or leave VM due to system being full.

## 2019-12-12 ENCOUNTER — Ambulatory Visit: Payer: PRIVATE HEALTH INSURANCE | Admitting: Physician Assistant

## 2019-12-12 VITALS — BP 122/76 | HR 93 | Temp 98.2°F | Resp 18

## 2019-12-12 DIAGNOSIS — E785 Hyperlipidemia, unspecified: Secondary | ICD-10-CM | POA: Insufficient documentation

## 2019-12-12 DIAGNOSIS — R7303 Prediabetes: Secondary | ICD-10-CM | POA: Diagnosis not present

## 2019-12-12 DIAGNOSIS — I10 Essential (primary) hypertension: Secondary | ICD-10-CM | POA: Diagnosis not present

## 2019-12-12 DIAGNOSIS — R972 Elevated prostate specific antigen [PSA]: Secondary | ICD-10-CM

## 2019-12-12 DIAGNOSIS — R7989 Other specified abnormal findings of blood chemistry: Secondary | ICD-10-CM

## 2019-12-12 DIAGNOSIS — E559 Vitamin D deficiency, unspecified: Secondary | ICD-10-CM

## 2019-12-12 MED ORDER — ATORVASTATIN CALCIUM 10 MG PO TABS: 10.0000 mg | ORAL_TABLET | Freq: Every day | ORAL | 1 refills | Status: DC

## 2019-12-12 NOTE — Patient Instructions (Addendum)
I encourage you to start making changes in your diet, including whole grain breads, sugar free drinks  Your Vit D was low, please take Vit D 2,000 units once daily  Check BP at home, keep written log and take it to your next office visit for review.   I am sending a referral for you to be seen by urology for your elevated PSA (prostate level)  Dr. Michaelle Birks with [facility] for the nodule on your back - [phone]  Please start the atorvastatin for your elevated LDL  If you do not hear from gastroenterology or urology, please let us know  We will call you with the results from your thyroid test  Your appointment to see Dr. Juleen China is 02/19/20 at 4:10pm  Please let us know if there is anything else we can do for you.  Kennieth Rad, PA-C Physician Assistant [facility] [email or web address]   Mediterranean Diet A Mediterranean diet refers to food and lifestyle choices that are based on the traditions of countries located on the [place]. This way of eating has been shown to help prevent certain conditions and improve outcomes for people who have chronic diseases, like kidney disease and heart disease. What are tips for following this plan? Lifestyle  Cook and eat meals together with your family, when possible.  Drink enough fluid to keep your urine clear or pale yellow.  Be physically active every day. This includes: ? Aerobic exercise like running or swimming. ? Leisure activities like gardening, walking, or housework.  Get 7-8 hours of sleep each night.  If recommended by your health care provider, drink red wine in moderation. This means 1 glass a day for nonpregnant women and 2 glasses a day for men. A glass of wine equals 5 oz (150 mL). Reading food labels   Check the serving size of packaged foods. For foods such as rice and pasta, the serving size refers to the amount of cooked product,  not dry.  Check the total fat in packaged foods. Avoid foods that have saturated fat or trans fats.  Check the ingredients list for added sugars, such as corn syrup. Shopping  At the grocery store, buy most of your food from the areas near the walls of the store. This includes: ? Fresh fruits and vegetables (produce). ? Grains, beans, nuts, and seeds. Some of these may be available in unpackaged forms or large amounts (in bulk). ? Fresh seafood. ? Poultry and eggs. ? Low-fat dairy products.  Buy whole ingredients instead of prepackaged foods.  Buy fresh fruits and vegetables in-season from local farmers markets.  Buy frozen fruits and vegetables in resealable bags.  If you do not have access to quality fresh seafood, buy precooked frozen shrimp or canned fish, such as tuna, salmon, or sardines.  Buy small amounts of raw or cooked vegetables, salads, or olives from the deli or salad bar at your store.  Stock your pantry so you always have certain foods on hand, such as olive oil, canned tuna, canned tomatoes, rice, pasta, and beans. Cooking  Cook foods with extra-virgin olive oil instead of using butter or other vegetable oils.  Have meat as a side dish, and have vegetables or grains as your main dish. This means having meat in small portions or adding small amounts of meat to foods like pasta or stew.  Use beans or vegetables instead of meat in common dishes like chili or lasagna.  Experiment with different cooking methods.  Try roasting or broiling vegetables instead of steaming or sauteing them.  Add frozen vegetables to soups, stews, pasta, or rice.  Add nuts or seeds for added healthy fat at each meal. You can add these to yogurt, salads, or vegetable dishes.  Marinate fish or vegetables using olive oil, lemon juice, garlic, and fresh herbs. Meal planning   Plan to eat 1 vegetarian meal one day each week. Try to work up to 2 vegetarian meals, if possible.  Eat seafood  2 or more times a week.  Have healthy snacks readily available, such as: ? Vegetable sticks with hummus. ? [place] yogurt. ? Fruit and nut trail mix.  Eat balanced meals throughout the week. This includes: ? Fruit: 2-3 servings a day ? Vegetables: 4-5 servings a day ? Low-fat dairy: 2 servings a day ? Fish, poultry, or lean meat: 1 serving a day ? Beans and legumes: 2 or more servings a week ? Nuts and seeds: 1-2 servings a day ? Whole grains: 6-8 servings a day ? Extra-virgin olive oil: 3-4 servings a day  Limit red meat and sweets to only a few servings a month What are my food choices?  Mediterranean diet ? Recommended  Grains: Whole-grain pasta. Brown rice. Bulgar wheat. Polenta. Couscous. Whole-wheat bread. [place].  Vegetables: Artichokes. Beets. Broccoli. Cabbage. Carrots. Eggplant. Green beans. Chard. Kale. Spinach. Onions. Leeks. Peas. Squash. Tomatoes. Peppers. Radishes.  Fruits: Apples. Apricots. Avocado. Berries. Bananas. Cherries. Dates. Figs. Grapes. Lemons. Melon. Oranges. Peaches. Plums. Pomegranate.  Meats and other protein foods: Beans. Almonds. Sunflower seeds. Pine nuts. Peanuts. [place]. Salmon. Scallops. Shrimp. [place]. Tilapia. Clams. Oysters. Eggs.  Dairy: Low-fat milk. Cheese. Greek yogurt.  Beverages: Water. Red wine. Herbal tea.  Fats and oils: Extra virgin olive oil. Avocado oil. Grape seed oil.  Sweets and desserts: [place] yogurt with honey. Baked apples. Poached pears. Trail mix.  Seasoning and other foods: Basil. Cilantro. Coriander. Cumin. Mint. Parsley. Sage. Rosemary. Tarragon. Garlic. Oregano. Thyme. Pepper. Balsalmic vinegar. Tahini. Hummus. Tomato sauce. Olives. Mushrooms. ? Limit these  Grains: Prepackaged pasta or rice dishes. Prepackaged cereal with added sugar.  Vegetables: Deep fried potatoes (french fries).  Fruits: Fruit canned in syrup.  Meats and other protein foods: Beef. Pork. Lamb. Poultry with skin. Hot dogs.  [place] Salines.  Dairy: Ice cream. Sour cream. Whole milk.  Beverages: Juice. Sugar-sweetened soft drinks. Beer. Liquor and spirits.  Fats and oils: Butter. Canola oil. Vegetable oil. Beef fat (tallow). Lard.  Sweets and desserts: Cookies. Cakes. Pies. Candy.  Seasoning and other foods: Mayonnaise. Premade sauces and marinades. The items listed may not be a complete list. Talk with your dietitian about what dietary choices are right for you. Summary  The Mediterranean diet includes both food and lifestyle choices.  Eat a variety of fresh fruits and vegetables, beans, nuts, seeds, and whole grains.  Limit the amount of red meat and sweets that you eat.  Talk with your health care provider about whether it is safe for you to drink red wine in moderation. This means 1 glass a day for nonpregnant women and 2 glasses a day for men. A glass of wine equals 5 oz (150 mL). This information is not intended to replace advice given to you by your health care provider. Make sure you discuss any questions you have with your health care provider. Document Revised: 10/17/2015 Document Reviewed: 10/10/2015 Elsevier Patient Education  [place].

## 2019-12-12 NOTE — Progress Notes (Signed)
Established Patient Office Visit  Subjective:  Patient ID: [name], male    DOB: 1955/12/16  Age: 64 y.o. MRN: [id number]  CC:  Chief Complaint  Patient presents with  . Follow-up    HPI [name] reports that he has been taking the lisinopril without any adverse effects, has been checking BP at [facility] and reports that they are WNL, states he did have one elevated reading while at work.    States that he has not made any dietary changes, states that he has had a low appetite and when he does eat a meal, he tends to eat out or grab crackers and ginger ale.  No new concerns today  Past Medical History:  Diagnosis Date  . Erectile dysfunction   . Essential hypertension 11/30/2019  . Gout   . Infected prepatellar bursa, right   . Polyarthritis   . Pre-diabetes     Past Surgical History:  Procedure Laterality Date  . IRRIGATION AND DEBRIDEMENT KNEE Right 12/28/2018   Procedure: IRRIGATION AND DEBRIDEMENT KNEE;  Surgeon: Mcarthur Rossetti, MD;  Location: [facility];  Service: Orthopedics;  Laterality: Right;  . KNEE SURGERY    . ROTATOR CUFF REPAIR    . THUMB ARTHROSCOPY Right 2007    Family History  Problem Relation Age of Onset  . Breast cancer Mother   . Prostate cancer Father   . Hypertension Father   . Lung cancer Son     Social History   Socioeconomic History  . Marital status: Married    Spouse name: Not on file  . Number of children: Not on file  . Years of education: Not on file  . Highest education level: Not on file  Occupational History  . Not on file  Tobacco Use  . Smoking status: Never Smoker  . Smokeless tobacco: Never Used  Vaping Use  . Vaping Use: Never used  Substance and Sexual Activity  . Alcohol use: No    Alcohol/week: 0.0 standard drinks  . Drug use: No  . Sexual activity: Not Currently  Other Topics Concern  . Not on file  Social History Narrative  . Not on file   Social Determinants of Health   Financial  Resource Strain:   . Difficulty of Paying Living Expenses: Not on file  Food Insecurity:   . Worried About Charity fundraiser in the Last Year: Not on file  . Ran Out of Food in the Last Year: Not on file  Transportation Needs:   . Lack of Transportation (Medical): Not on file  . Lack of Transportation (Non-Medical): Not on file  Physical Activity:   . Days of Exercise per Week: Not on file  . Minutes of Exercise per Session: Not on file  Stress:   . Feeling of Stress : Not on file  Social Connections:   . Frequency of Communication with Friends and Family: Not on file  . Frequency of Social Gatherings with Friends and Family: Not on file  . Attends Religious Services: Not on file  . Active Member of Clubs or Organizations: Not on file  . Attends Archivist Meetings: Not on file  . Marital Status: Not on file  Intimate Partner Violence:   . Fear of Current or Ex-Partner: Not on file  . Emotionally Abused: Not on file  . Physically Abused: Not on file  . Sexually Abused: Not on file    Outpatient Medications Prior to Visit  Medication Sig Dispense Refill  .  lisinopril (ZESTRIL) 5 MG tablet Take 1 tablet (5 mg total) by mouth daily. 30 tablet 3  . meloxicam (MOBIC) 15 MG tablet Take 1 tablet (15 mg total) by mouth daily. 30 tablet 0   No facility-administered medications prior to visit.    No Known Allergies  ROS Review of Systems  Constitutional: Negative.   HENT: Negative.   Eyes: Negative.   Respiratory: Negative.   Cardiovascular: Negative.   Gastrointestinal: Negative.   Endocrine: Negative.   Genitourinary: Negative.   Musculoskeletal: Positive for arthralgias and myalgias.  Skin: Negative.   Allergic/Immunologic: Negative.   Neurological: Negative.   Hematological: Negative.   Psychiatric/Behavioral: Negative.       Objective:    Physical Exam Vitals and nursing note reviewed.  Constitutional:      Appearance: Normal appearance.  HENT:      Head: Normocephalic and atraumatic.     Right Ear: External ear normal.     Left Ear: External ear normal.     Nose: Nose normal.     Mouth/Throat:     Mouth: Mucous membranes are moist.     Pharynx: Oropharynx is clear.  Eyes:     Extraocular Movements: Extraocular movements intact.     Conjunctiva/sclera: Conjunctivae normal.     Pupils: Pupils are equal, round, and reactive to light.  Cardiovascular:     Rate and Rhythm: Normal rate and regular rhythm.     Pulses: Normal pulses.     Heart sounds: Normal heart sounds.  Pulmonary:     Effort: Pulmonary effort is normal.     Breath sounds: Normal breath sounds.  Abdominal:     General: Abdomen is flat.     Palpations: Abdomen is soft.  Musculoskeletal:        General: Normal range of motion.     Cervical back: Normal range of motion and neck supple.  Skin:    General: Skin is warm and dry.  Neurological:     General: No focal deficit present.     Mental Status: He is alert and oriented to person, place, and time.  Psychiatric:        Mood and Affect: Mood normal.        Behavior: Behavior normal.        Thought Content: Thought content normal.        Judgment: Judgment normal.     BP 122/76   Pulse 93   Temp 98.2 F (36.8 C) (Oral)   Resp 18   SpO2 94%  Wt Readings from Last 3 Encounters:  11/30/19 189 lb (85.7 kg)  01/19/19 192 lb (87.1 kg)  12/28/18 200 lb 2.8 oz (90.8 kg)     Health Maintenance Due  Topic Date Due  . COLON CANCER SCREENING ANNUAL FOBT  Never done  . INFLUENZA VACCINE  Never done    There are no preventive care reminders to display for this patient.  Lab Results  Component Value Date   TSH 0.336 (L) 11/30/2019   Lab Results  Component Value Date   WBC 11.2 (H) 11/30/2019   HGB 14.1 11/30/2019   HCT 41.2 11/30/2019   MCV 89 11/30/2019   PLT 317 11/30/2019   Lab Results  Component Value Date   NA 139 11/30/2019   K 4.3 11/30/2019   CO2 24 01/04/2019   GLUCOSE 103 (H)  11/30/2019   BUN 7 (L) 11/30/2019   CREATININE 0.92 11/30/2019   BILITOT 0.7 11/30/2019   ALKPHOS 119  11/30/2019   AST 16 11/30/2019   ALT 50 (H) 01/01/2019   PROT 7.5 11/30/2019   ALBUMIN 4.5 11/30/2019   CALCIUM 9.7 11/30/2019   ANIONGAP 10 01/04/2019   Lab Results  Component Value Date   CHOL 193 11/30/2019   Lab Results  Component Value Date   HDL 59 11/30/2019   Lab Results  Component Value Date   LDLCALC 117 (H) 11/30/2019   Lab Results  Component Value Date   TRIG 93 11/30/2019   Lab Results  Component Value Date   CHOLHDL 3.3 11/30/2019   Lab Results  Component Value Date   HGBA1C 5.7 (H) 12/30/2018      Assessment & Plan:   Problem List Items Addressed This Visit      Cardiovascular and Mediastinum   Essential hypertension   Relevant Medications   atorvastatin (LIPITOR) 10 MG tablet     Other   Prediabetes   Hyperlipidemia - Primary   Relevant Medications   atorvastatin (LIPITOR) 10 MG tablet   Elevated PSA, less than 10 ng/ml   Relevant Orders   Ambulatory referral to Urology   Vitamin D deficiency   Abnormal thyroid blood test   Relevant Orders   Thyroid Panel With TSH      Meds ordered this encounter  Medications  . atorvastatin (LIPITOR) 10 MG tablet    Sig: Take 1 tablet (10 mg total) by mouth daily.    Dispense:  30 tablet    Refill:  1    Order Specific Question:   Supervising Provider    Answer:   WRIGHT, PATRICK E [[id number]]  1. Hyperlipidemia, unspecified hyperlipidemia type The 10-year ASCVD risk score Mikey Bussing DC Brooke Bonito., et al., 2013) is: 13.5%   Values used to calculate the score:     Age: 26 years     Sex: Male     Is Non-Hispanic African American: Yes     Diabetic: No     Tobacco smoker: No     Systolic Blood Pressure: 440 mmHg     Is BP treated: Yes     HDL Cholesterol: 59 mg/dL     Total Cholesterol: 193 mg/dL  Agreeable to start statin, encouraged low cholesterol diet  - atorvastatin (LIPITOR) 10 MG tablet; Take 1  tablet (10 mg total) by mouth daily.  Dispense: 30 tablet; Refill: 1  2. Essential hypertension BP WNL today, encouraged checking BP daily, keep written log, work on lifestyle modifications; cont lisinopril 5 mg  3. Elevated PSA, less than 10 ng/ml Ref to Urology  4. Prediabetes Cont working on lifestyle modifications; patient given appt to f/u with Dr. Juleen China 02/19/20  5. Vitamin D deficiency Encouraged Vit D 2,000 units once daily OTC  6. Abnormal thyroid blood test  - Thyroid Panel With TSH     I have reviewed the patient's medical history (PMH, PSH, Social History, Family History, Medications, and allergies) , and have been updated if relevant. I spent 30 minutes reviewing chart and  face to face time with patient.    Follow-up: Return in about 2 months (around 02/19/2020) for with Dr. Juleen China at [facility].    Loraine Grip Mayers, PA-C

## 2019-12-13 ENCOUNTER — Telehealth: Payer: Self-pay | Admitting: Physician Assistant

## 2019-12-13 LAB — THYROID PANEL WITH TSH
Free Thyroxine Index: 1.3 (ref 1.2–4.9)
T3 Uptake Ratio: 21 % — ABNORMAL LOW (ref 24–39)
T4, Total: 6 ug/dL (ref 4.5–12.0)
TSH: 0.779 u[IU]/mL (ref 0.450–4.500)

## 2019-12-13 NOTE — Telephone Encounter (Signed)
-----   Message from [name], [place] sent at 12/13/2019  9:05 AM EDT ----- Please call patient and let him know that his thyroid was WNL.  Thanks

## 2019-12-13 NOTE — Telephone Encounter (Signed)
.  Medical Assistant left message on patient's home and cell voicemail. Voicemail states to give a call back to [place] with [facility] [phone]

## 2019-12-14 ENCOUNTER — Telehealth: Payer: Self-pay | Admitting: *Deleted

## 2019-12-14 NOTE — Telephone Encounter (Signed)
Patient verified DOB Patient is aware of TSH being WNL. Patient still complains of body aches and will report to the [facility] on Wednesday 10/20 for a follow up and referral if needed.

## 2019-12-21 ENCOUNTER — Ambulatory Visit
Admission: EM | Admit: 2019-12-21 | Discharge: 2019-12-21 | Disposition: A | Discharge status: Home / Self Care | Payer: PRIVATE HEALTH INSURANCE | Attending: Physician Assistant | Admitting: Physician Assistant

## 2019-12-21 ENCOUNTER — Other Ambulatory Visit: Payer: Self-pay

## 2019-12-21 DIAGNOSIS — M7989 Other specified soft tissue disorders: Secondary | ICD-10-CM | POA: Diagnosis not present

## 2019-12-21 DIAGNOSIS — R238 Other skin changes: Secondary | ICD-10-CM

## 2019-12-21 DIAGNOSIS — M130 Polyarthritis, unspecified: Secondary | ICD-10-CM

## 2019-12-21 MED ORDER — MELOXICAM 15 MG PO TABS: 15.0000 mg | ORAL_TABLET | Freq: Every day | ORAL | 0 refills | Status: DC

## 2019-12-21 MED ORDER — CEPHALEXIN 500 MG PO CAPS: 500.0000 mg | ORAL_CAPSULE | Freq: Four times a day (QID) | ORAL | 0 refills | Status: DC

## 2019-12-21 NOTE — ED Provider Notes (Signed)
[name]    CSN: [id number] Arrival date & time: 12/21/19  1632      History   Chief Complaint Chief Complaint  Patient presents with  . Leg Swelling    HPI [name] is a 64 y.o. male.   64 year old male with history of polyarthritis, HTN comes in for 3 day history of left lower leg swelling/pain. Denies injury/trauma. States had similar symptoms to his right leg last year requiring debridement at the OR. He has redness/warmth to the foot without obvious spreading. Sharp stabbing pain to the lower leg. Denies fever. Denies recent surgery, immobility. Denies history of blood clots. Denies CP/shob     Past Medical History:  Diagnosis Date  . Erectile dysfunction   . Essential hypertension 11/30/2019  . Gout   . Infected prepatellar bursa, right   . Polyarthritis   . Pre-diabetes     Patient Active Problem List   Diagnosis Date Noted  . Hyperlipidemia 12/12/2019  . Elevated PSA, less than 10 ng/ml 12/12/2019  . Vitamin D deficiency 12/12/2019  . Abnormal thyroid blood test 12/12/2019  . Essential hypertension 11/30/2019  . Subcutaneous nodule of back 11/30/2019  . Prepatellar abscess 12/29/2018  . Cellulitis of right knee 12/28/2018  . Infected prepatellar bursa, right   . Prediabetes 07/08/2016  . Elevated blood pressure reading without diagnosis of hypertension 06/01/2016  . Dental caries 05/21/2015  . Thrombocytosis 04/24/2015  . Erectile dysfunction 04/23/2015  . Polyarthritis 03/08/2015  . ROTATOR CUFF INJURY, LEFT SHOULDER 06/20/2009    Past Surgical History:  Procedure Laterality Date  . IRRIGATION AND DEBRIDEMENT KNEE Right 12/28/2018   Procedure: IRRIGATION AND DEBRIDEMENT KNEE;  Surgeon: Mcarthur Rossetti, MD;  Location: [facility];  Service: Orthopedics;  Laterality: Right;  . KNEE SURGERY    . ROTATOR CUFF REPAIR    . THUMB ARTHROSCOPY Right 2007       Home Medications    Prior to Admission medications   Medication  Sig Start Date End Date Taking? Authorizing Provider  lisinopril (ZESTRIL) 5 MG tablet Take 1 tablet (5 mg total) by mouth daily. 11/30/19  Yes Mayers, Cari S, PA-C  atorvastatin (LIPITOR) 10 MG tablet Take 1 tablet (10 mg total) by mouth daily. 12/12/19   Mayers, Cari S, PA-C  cephALEXin (KEFLEX) 500 MG capsule Take 1 capsule (500 mg total) by mouth 4 (four) times daily. 12/21/19   Tasia Catchings, Greyson Peavy V, PA-C  meloxicam (MOBIC) 15 MG tablet Take 1 tablet (15 mg total) by mouth daily. 12/21/19   Ok Edwards, PA-C    Family History Family History  Problem Relation Age of Onset  . Breast cancer Mother   . Prostate cancer Father   . Hypertension Father   . Lung cancer Son     Social History Social History   Tobacco Use  . Smoking status: Never Smoker  . Smokeless tobacco: Never Used  Vaping Use  . Vaping Use: Never used  Substance Use Topics  . Alcohol use: No    Alcohol/week: 0.0 standard drinks  . Drug use: No     Allergies   Patient has no known allergies.   Review of Systems Review of Systems  Reason unable to perform ROS: See HPI as above.     Physical Exam Triage Vital Signs ED Triage Vitals [12/21/19 1726]  Enc Vitals Group     BP (!) 155/84     Pulse Rate 89     Resp 20  Temp 98.9 F (37.2 C)     Temp Source Oral     SpO2 95 %     Weight      Height      Head Circumference      Peak Flow      Pain Score 10     Pain Loc      Pain Edu?      Excl. in [place]?    No data found.  Updated Vital Signs BP (!) 155/84 (BP Location: Left Arm)   Pulse 89   Temp 98.9 F (37.2 C) (Oral)   Resp 20   SpO2 95%   Physical Exam Constitutional:      General: He is not in acute distress.    Appearance: Normal appearance. He is well-developed. He is not toxic-appearing or diaphoretic.  HENT:     Head: Normocephalic and atraumatic.  Eyes:     Conjunctiva/sclera: Conjunctivae normal.     Pupils: Pupils are equal, round, and reactive to light.  Pulmonary:     Effort:  Pulmonary effort is normal. No respiratory distress.  Musculoskeletal:     Cervical back: Normal range of motion and neck supple.     Comments: Swelling to the left lower leg/foot. 1+ pitting edema to the mid shin. Calf is soft, nontender, without swelling. Dorsal foot with erythema and warmth. Tender to palpation. Full ROM of lower extremity. Strength and sensation intact. Negative homans.  Skin:    General: Skin is warm and dry.  Neurological:     Mental Status: He is alert and oriented to person, place, and time.     UC Treatments / Results  Labs (all labs ordered are listed, but only abnormal results are displayed) Labs Reviewed - No data to display  EKG   Radiology No results found.  Procedures Procedures (including critical care time)  Medications Ordered in UC Medications - No data to display  Initial Impression / Assessment and Plan / UC Course  I have reviewed the triage vital signs and the nursing notes.  Pertinent labs & imaging results that were available during my care of the patient were reviewed by me and considered in my medical decision making (see chart for details).    Chart review showed patient with rapid spreading (within 10 hrs) cellulitis 12/2018 requiring IV abx and irrigation/debridement in the OR. Currently with erythema/warmth to the left dorsal foot. No induration/streaking. However, given history, will cover with keflex. Has interior leg swelling with soft calf. Lower suspicion of DVT, but will order DVT study for evaluation. For now, elevation, ace wrap. Low threshold for ED visit. Return precautions given. Otherwise PCP follow up for monitoring.   Final Clinical Impressions(s) / UC Diagnoses   Final diagnoses:  Left leg swelling  Redness and swelling of lower leg    ED Prescriptions    Medication Sig Dispense Auth. Provider   cephALEXin (KEFLEX) 500 MG capsule Take 1 capsule (500 mg total) by mouth 4 (four) times daily. 28 capsule Mele Sylvester  V, PA-C   meloxicam (MOBIC) 15 MG tablet Take 1 tablet (15 mg total) by mouth daily. 30 tablet Ok Edwards, PA-C     PDMP not reviewed this encounter.   Ok Edwards, PA-C 12/21/19 2254

## 2019-12-21 NOTE — Discharge Instructions (Signed)
Please follow up to [facility] for DVT study.  Start keflex for possible infection. Ace wrap for compression. Keep leg elevated. You can take mobic daily for any pain at this time. If having any chest pain, shortness of breath, go to the emergency department for further evaluation. If having fever, redness traveling up the leg, go to the emergency department for further evaluation.

## 2019-12-21 NOTE — ED Triage Notes (Signed)
Patient in with c/o sharp stabbing lower left leg swelling that started 3 days ago.  Also c/o bilat shoulder pain and hand pain.  States that he can't get his hands to close.  Had similar pain last year in right leg and had to have surgery  Patient has been taking ibuprofen for pain with no relief  Left lower leg warm to touch, +2 pedal pulse. Patient able to dorsiflex and plantar flex w/o pain

## 2019-12-22 ENCOUNTER — Emergency Department (HOSPITAL_COMMUNITY): Payer: PRIVATE HEALTH INSURANCE

## 2019-12-22 ENCOUNTER — Other Ambulatory Visit: Payer: Self-pay

## 2019-12-22 ENCOUNTER — Emergency Department (HOSPITAL_COMMUNITY)
Admission: EM | Admit: 2019-12-22 | Discharge: 2019-12-22 | Disposition: A | Discharge status: Home / Self Care | Payer: PRIVATE HEALTH INSURANCE | Attending: Emergency Medicine | Admitting: Emergency Medicine

## 2019-12-22 ENCOUNTER — Ambulatory Visit (HOSPITAL_BASED_OUTPATIENT_CLINIC_OR_DEPARTMENT_OTHER)
Admission: RE | Admit: 2019-12-22 | Discharge: 2019-12-22 | Disposition: A | Discharge status: Home / Self Care | Payer: PRIVATE HEALTH INSURANCE | Source: Ambulatory Visit | Attending: Physician Assistant | Admitting: Physician Assistant

## 2019-12-22 ENCOUNTER — Encounter (INDEPENDENT_AMBULATORY_CARE_PROVIDER_SITE_OTHER): Payer: Self-pay

## 2019-12-22 ENCOUNTER — Encounter (HOSPITAL_COMMUNITY): Payer: Self-pay | Admitting: Emergency Medicine

## 2019-12-22 DIAGNOSIS — M7989 Other specified soft tissue disorders: Secondary | ICD-10-CM | POA: Insufficient documentation

## 2019-12-22 DIAGNOSIS — R079 Chest pain, unspecified: Secondary | ICD-10-CM | POA: Diagnosis present

## 2019-12-22 DIAGNOSIS — Z5321 Procedure and treatment not carried out due to patient leaving prior to being seen by health care provider: Secondary | ICD-10-CM | POA: Insufficient documentation

## 2019-12-22 LAB — CBC
HCT: 38.1 % — ABNORMAL LOW (ref 39.0–52.0)
Hemoglobin: 12.2 g/dL — ABNORMAL LOW (ref 13.0–17.0)
MCH: 29.8 pg (ref 26.0–34.0)
MCHC: 32 g/dL (ref 30.0–36.0)
MCV: 92.9 fL (ref 80.0–100.0)
Platelets: 433 10*3/uL — ABNORMAL HIGH (ref 150–400)
RBC: 4.1 MIL/uL — ABNORMAL LOW (ref 4.22–5.81)
RDW: 11.9 % (ref 11.5–15.5)
WBC: 8.9 10*3/uL (ref 4.0–10.5)
nRBC: 0 % (ref 0.0–0.2)

## 2019-12-22 LAB — BASIC METABOLIC PANEL
Anion gap: 10 (ref 5–15)
BUN: 9 mg/dL (ref 8–23)
CO2: 21 mmol/L — ABNORMAL LOW (ref 22–32)
Calcium: 8.8 mg/dL — ABNORMAL LOW (ref 8.9–10.3)
Chloride: 106 mmol/L (ref 98–111)
Creatinine, Ser: 1.01 mg/dL (ref 0.61–1.24)
GFR, Estimated: >60 mL/min (ref >60)
Glucose, Bld: 169 mg/dL — ABNORMAL HIGH (ref 70–99)
Potassium: 3.8 mmol/L (ref 3.5–5.1)
Sodium: 137 mmol/L (ref 135–145)

## 2019-12-22 LAB — TROPONIN I (HIGH SENSITIVITY): Troponin I (High Sensitivity): 3 ng/L (ref <18)

## 2019-12-22 IMAGING — DX DG CHEST 2V
2 series · 2 of 2 positions shown · non-contrast
Comparison: [DATE].

CLINICAL DATA: Chest pain.

EXAM:
CHEST - 2 VIEW

[chest pa]
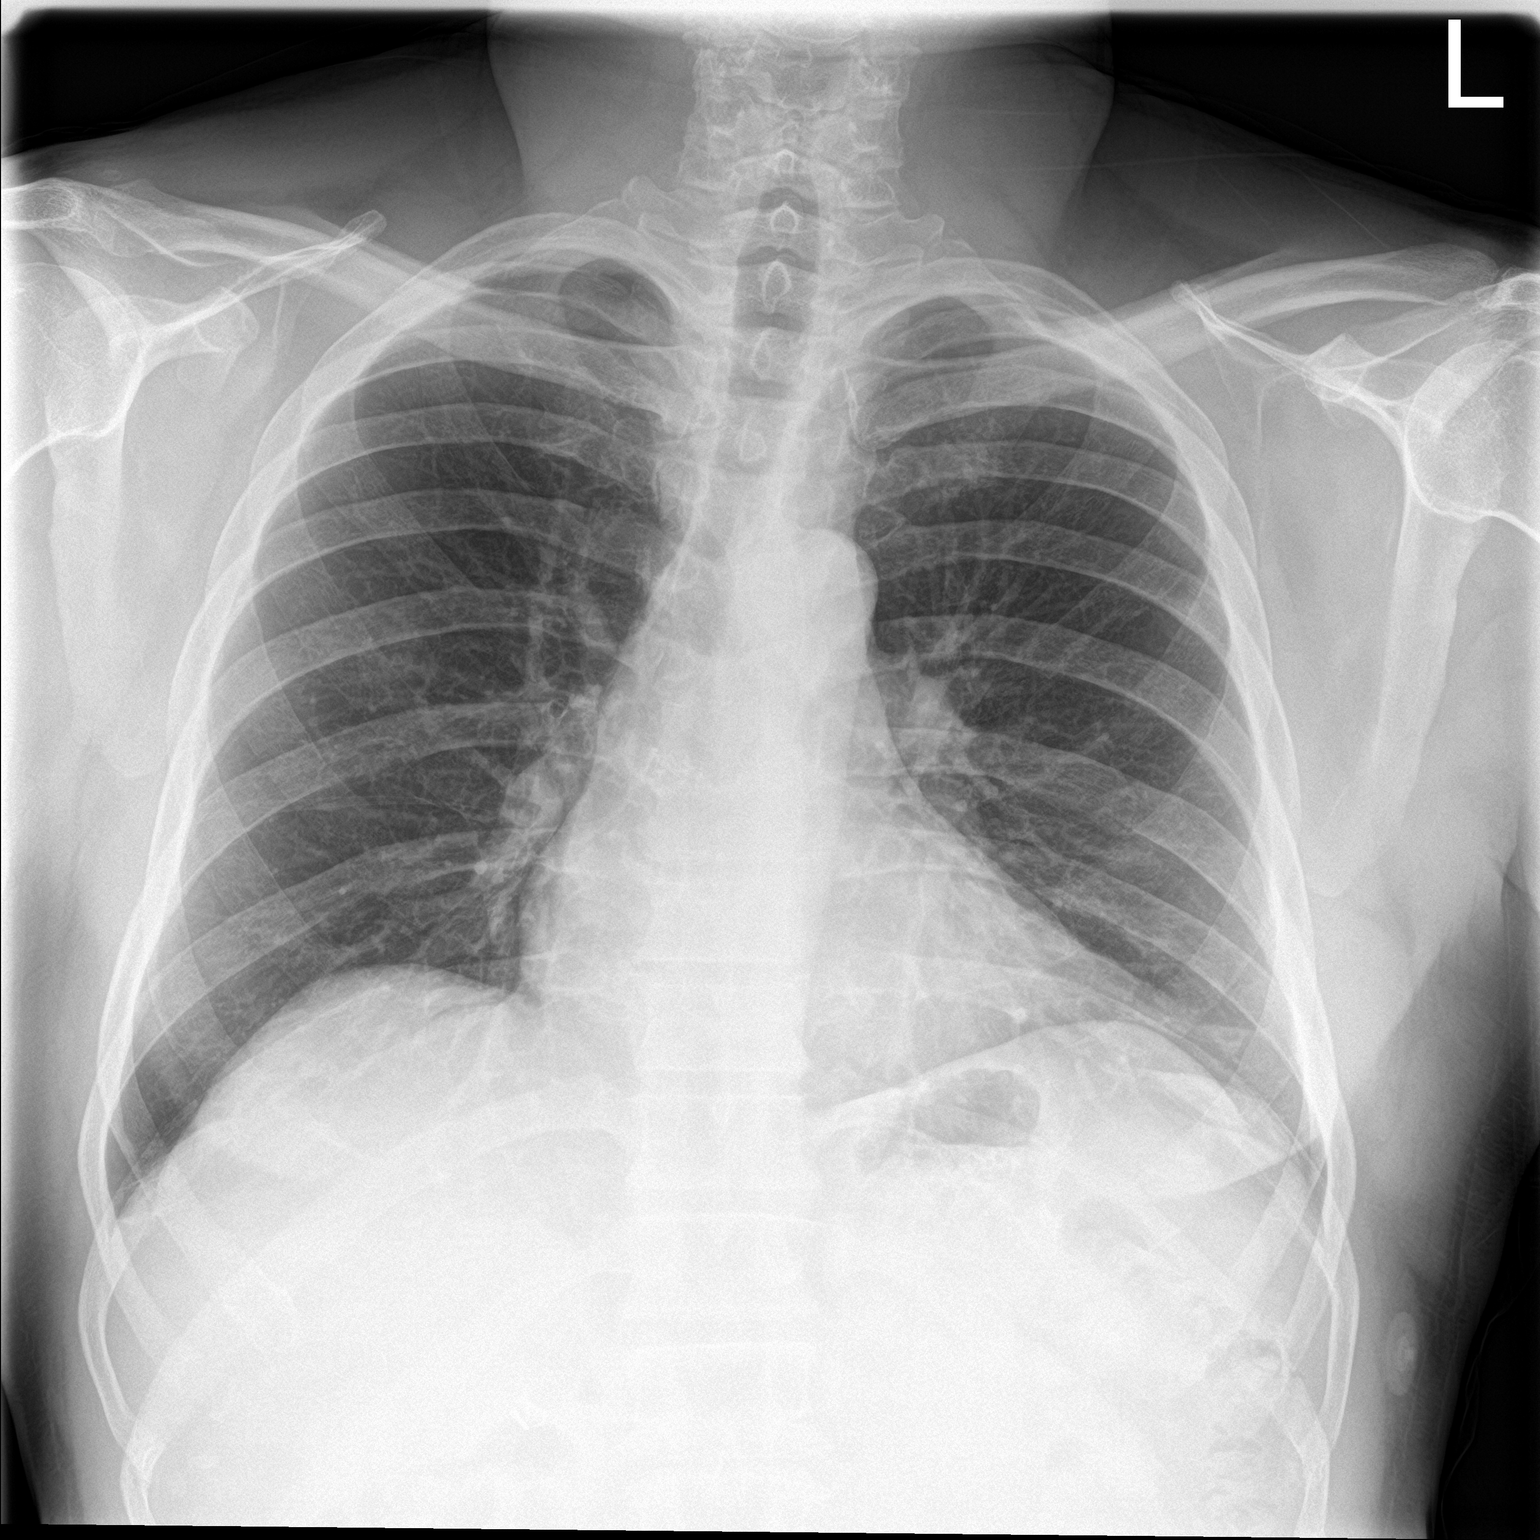

[chest lat]
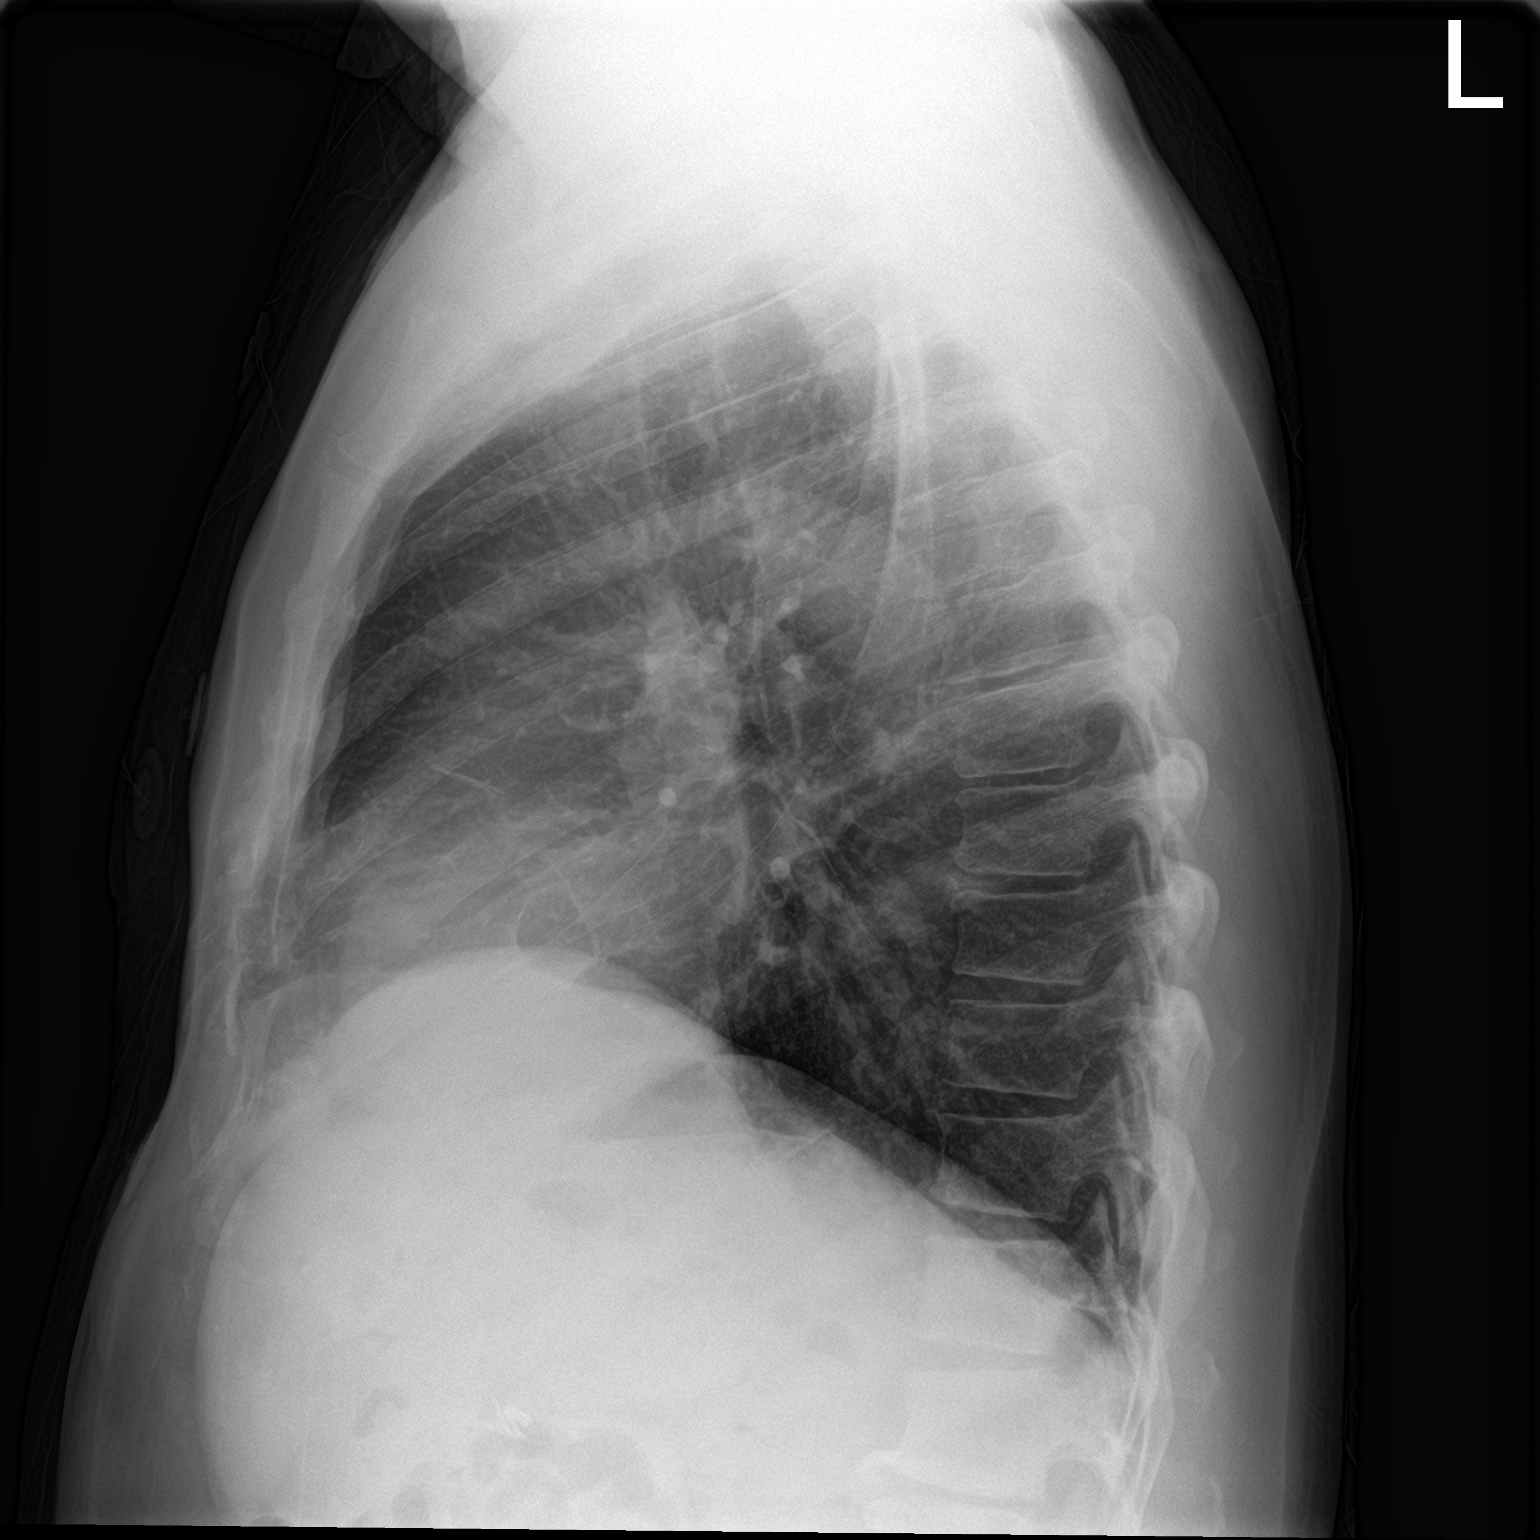

[2 of 2 positions shown; findings below may reference images not displayed]

FINDINGS: The heart size and mediastinal contours are within normal limits.
Both lungs are clear. No pneumothorax or pleural effusion is noted.
The visualized skeletal structures are unremarkable.
IMPRESSION: No active cardiopulmonary disease.

## 2019-12-22 NOTE — ED Triage Notes (Signed)
Pt states "every joint on me hurts," ankle swelling and chest pain. Pt had DVT study today.

## 2019-12-22 NOTE — ED Notes (Signed)
Pt called 3x no response  

## 2019-12-22 NOTE — Progress Notes (Signed)
Left lower extremity venous duplex completed. Refer to "CV Proc" under chart review to view preliminary results.  Note: during exam, patient described having left-sided chest pain/chest pressure, rated about 4-5/10, for approximately a few weeks. Patient is in no apparent distress. Spoke with Diane of [place] office, patient advised by [place] to go to the ED if he is having active chest pain.  12/22/2019 2:13 PM Kelby Aline., MHA, RVT, RDCS, RDMS

## 2019-12-26 ENCOUNTER — Ambulatory Visit: Payer: PRIVATE HEALTH INSURANCE | Admitting: Physician Assistant

## 2019-12-26 VITALS — BP 152/82 | HR 95 | Temp 98.7°F | Resp 18 | Ht 67.0 in | Wt 190.0 lb

## 2019-12-26 DIAGNOSIS — M130 Polyarthritis, unspecified: Secondary | ICD-10-CM | POA: Diagnosis not present

## 2019-12-26 DIAGNOSIS — F331 Major depressive disorder, recurrent, moderate: Secondary | ICD-10-CM | POA: Diagnosis not present

## 2019-12-26 DIAGNOSIS — I10 Essential (primary) hypertension: Secondary | ICD-10-CM

## 2019-12-26 DIAGNOSIS — F5104 Psychophysiologic insomnia: Secondary | ICD-10-CM | POA: Diagnosis not present

## 2019-12-26 MED ORDER — PREDNISONE 20 MG PO TABS: ORAL_TABLET | ORAL | 0 refills | Status: DC

## 2019-12-26 MED ORDER — METHYLPREDNISOLONE ACETATE 80 MG/ML IJ SUSP
80.0000 mg | Freq: Once | INTRAMUSCULAR | Status: AC
  Administered 2019-12-26: 80 mg via INTRAMUSCULAR

## 2019-12-26 MED ORDER — KETOROLAC TROMETHAMINE 60 MG/2ML IM SOLN
60.0000 mg | Freq: Once | INTRAMUSCULAR | Status: AC
  Administered 2019-12-26: 60 mg via INTRAMUSCULAR

## 2019-12-26 MED ORDER — HYDROXYZINE HCL 50 MG PO TABS: 50.0000 mg | ORAL_TABLET | Freq: Three times a day (TID) | ORAL | 0 refills | Status: DC | PRN

## 2019-12-26 MED ORDER — DULOXETINE HCL 30 MG PO CPEP: ORAL_CAPSULE | ORAL | 2 refills | Status: DC

## 2019-12-26 MED ORDER — HYDROXYZINE HCL 50 MG PO TABS: ORAL_TABLET | ORAL | 0 refills | Status: DC

## 2019-12-26 NOTE — Progress Notes (Signed)
Established Patient Office Visit  Subjective:  Patient ID: [name], male    DOB: 11-25-1955  Age: 64 y.o. MRN: [id number]  CC:  Chief Complaint  Patient presents with  . Pain    HPI [name] states that he continues to have all over joint pain and left lower leg/foot swelling. States that he went to [facility] last week and was given a prescription for Keflex and had imaging completed. States that he has been taking the Keflex without relief.  States that he was given Mobic without relief.  States that he is having difficulty sleeping due to the pain.  During his imaging to rule out DVT, he mentioned to sonographer that he has been having some chest discomfort, he was encouraged to present to the emergency department, which he did, troponin levels were within normal limits, states that he did not stay due to wait time.  Has appt with urology on November 4th for elevated PSA  Past Medical History:  Diagnosis Date  . Erectile dysfunction   . Essential hypertension 11/30/2019  . Gout   . Infected prepatellar bursa, right   . Polyarthritis   . Pre-diabetes     Past Surgical History:  Procedure Laterality Date  . IRRIGATION AND DEBRIDEMENT KNEE Right 12/28/2018   Procedure: IRRIGATION AND DEBRIDEMENT KNEE;  Surgeon: Mcarthur Rossetti, MD;  Location: [facility];  Service: Orthopedics;  Laterality: Right;  . KNEE SURGERY    . ROTATOR CUFF REPAIR    . THUMB ARTHROSCOPY Right 2007    Family History  Problem Relation Age of Onset  . Breast cancer Mother   . Prostate cancer Father   . Hypertension Father   . Lung cancer Son     Social History   Socioeconomic History  . Marital status: Married    Spouse name: Not on file  . Number of children: Not on file  . Years of education: Not on file  . Highest education level: Not on file  Occupational History  . Not on file  Tobacco Use  . Smoking status: Never Smoker  . Smokeless tobacco: Never Used  Vaping Use  .  Vaping Use: Never used  Substance and Sexual Activity  . Alcohol use: No    Alcohol/week: 0.0 standard drinks  . Drug use: No  . Sexual activity: Not Currently  Other Topics Concern  . Not on file  Social History Narrative  . Not on file   Social Determinants of Health   Financial Resource Strain:   . Difficulty of Paying Living Expenses: Not on file  Food Insecurity:   . Worried About Charity fundraiser in the Last Year: Not on file  . Ran Out of Food in the Last Year: Not on file  Transportation Needs:   . Lack of Transportation (Medical): Not on file  . Lack of Transportation (Non-Medical): Not on file  Physical Activity:   . Days of Exercise per Week: Not on file  . Minutes of Exercise per Session: Not on file  Stress:   . Feeling of Stress : Not on file  Social Connections:   . Frequency of Communication with Friends and Family: Not on file  . Frequency of Social Gatherings with Friends and Family: Not on file  . Attends Religious Services: Not on file  . Active Member of Clubs or Organizations: Not on file  . Attends Archivist Meetings: Not on file  . Marital Status: Not on file  Intimate  Partner Violence:   . Fear of Current or Ex-Partner: Not on file  . Emotionally Abused: Not on file  . Physically Abused: Not on file  . Sexually Abused: Not on file    Outpatient Medications Prior to Visit  Medication Sig Dispense Refill  . atorvastatin (LIPITOR) 10 MG tablet Take 1 tablet (10 mg total) by mouth daily. 30 tablet 1  . cephALEXin (KEFLEX) 500 MG capsule Take 1 capsule (500 mg total) by mouth 4 (four) times daily. 28 capsule 0  . lisinopril (ZESTRIL) 5 MG tablet Take 1 tablet (5 mg total) by mouth daily. 30 tablet 3  . meloxicam (MOBIC) 15 MG tablet Take 1 tablet (15 mg total) by mouth daily. 30 tablet 0   No facility-administered medications prior to visit.    No Known Allergies  ROS Review of Systems  Constitutional: Negative.   HENT:  Negative.   Eyes: Negative.   Respiratory: Negative for chest tightness, shortness of breath and wheezing.   Cardiovascular: Positive for leg swelling. Negative for chest pain and palpitations.  Gastrointestinal: Negative.   Endocrine: Negative.   Genitourinary: Negative.   Musculoskeletal: Positive for arthralgias, back pain and myalgias.  Skin: Negative.   Allergic/Immunologic: Negative.   Neurological: Negative.   Hematological: Negative.   Psychiatric/Behavioral: Positive for dysphoric mood and sleep disturbance. Negative for self-injury and suicidal ideas.      Objective:    Physical Exam Vitals and nursing note reviewed.  Constitutional:      General: He is not in acute distress.    Appearance: Normal appearance. He is not ill-appearing.  HENT:     Head: Normocephalic and atraumatic.     Right Ear: External ear normal.     Left Ear: External ear normal.     Nose: Nose normal.     Mouth/Throat:     Mouth: Mucous membranes are moist.     Pharynx: Oropharynx is clear.  Eyes:     Extraocular Movements: Extraocular movements intact.     Conjunctiva/sclera: Conjunctivae normal.     Pupils: Pupils are equal, round, and reactive to light.  Cardiovascular:     Rate and Rhythm: Normal rate and regular rhythm.     Pulses: Normal pulses.          Dorsalis pedis pulses are 2+ on the right side and 2+ on the left side.       Posterior tibial pulses are 2+ on the right side and 2+ on the left side.     Heart sounds: Normal heart sounds.  Pulmonary:     Effort: Pulmonary effort is normal.     Breath sounds: Normal breath sounds.  Abdominal:     General: Abdomen is flat.     Palpations: Abdomen is soft.  Musculoskeletal:     Right shoulder: Tenderness present. No swelling. Decreased range of motion.     Left shoulder: Tenderness present. No swelling. Decreased range of motion.     Right upper arm: Tenderness present. No swelling.     Left upper arm: Tenderness present. No  swelling.     Right elbow: No swelling. Normal range of motion. Tenderness present.     Left elbow: No swelling. Normal range of motion. Tenderness present.     Right forearm: Tenderness present. No swelling.     Left forearm: Tenderness present. No swelling.     Right wrist: Tenderness present. No swelling. Normal range of motion.     Left wrist: Tenderness present. No swelling.  Normal range of motion.     Right hand: Tenderness present. No swelling. Decreased range of motion. Decreased strength.     Left hand: Tenderness present. No swelling. Decreased range of motion. Decreased strength.     Cervical back: Normal, normal range of motion and neck supple.     Thoracic back: Normal.     Lumbar back: Normal.     Right hip: Tenderness present. Decreased range of motion.     Left hip: Tenderness present. Decreased range of motion.     Right knee: No swelling. Decreased range of motion. Tenderness present.     Left knee: No swelling. Decreased range of motion. Tenderness present.     Right lower leg: Normal.     Left lower leg: 1+ Edema present.     Right ankle: No tenderness.     Left ankle: Swelling present. No tenderness.     Left foot: Swelling present.  Skin:    General: Skin is warm.  Neurological:     General: No focal deficit present.     Mental Status: He is alert and oriented to person, place, and time.  Psychiatric:        Mood and Affect: Affect is tearful.        Speech: Speech normal.        Behavior: Behavior normal.        Thought Content: Thought content does not include homicidal or suicidal ideation.        Cognition and Memory: Cognition normal.        Judgment: Judgment normal.     BP (!) 152/82 (BP Location: Left Arm, Patient Position: Sitting, Cuff Size: Large)   Pulse 95   Temp 98.7 F (37.1 C) (Oral)   Resp 18   Ht 5\' 7"  (1.702 m)   Wt 190 lb (86.2 kg)   SpO2 97%   BMI 29.76 kg/m  Wt Readings from Last 3 Encounters:  12/26/19 190 lb (86.2 kg)   12/22/19 197 lb (89.4 kg)  11/30/19 189 lb (85.7 kg)     Health Maintenance Due  Topic Date Due  . COLON CANCER SCREENING ANNUAL FOBT  Never done  . INFLUENZA VACCINE  Never done  . PNA vac Low Risk Adult (1 of 2 - PCV13) 12/21/2019    There are no preventive care reminders to display for this patient.  Lab Results  Component Value Date   TSH 0.779 12/12/2019   Lab Results  Component Value Date   WBC 8.9 12/22/2019   HGB 12.2 (L) 12/22/2019   HCT 38.1 (L) 12/22/2019   MCV 92.9 12/22/2019   PLT 433 (H) 12/22/2019   Lab Results  Component Value Date   NA 137 12/22/2019   K 3.8 12/22/2019   CO2 21 (L) 12/22/2019   GLUCOSE 169 (H) 12/22/2019   BUN 9 12/22/2019   CREATININE 1.01 12/22/2019   BILITOT 0.7 11/30/2019   ALKPHOS 119 11/30/2019   AST 16 11/30/2019   ALT 50 (H) 01/01/2019   PROT 7.5 11/30/2019   ALBUMIN 4.5 11/30/2019   CALCIUM 8.8 (L) 12/22/2019   ANIONGAP 10 12/22/2019   Lab Results  Component Value Date   CHOL 193 11/30/2019   Lab Results  Component Value Date   HDL 59 11/30/2019   Lab Results  Component Value Date   LDLCALC 117 (H) 11/30/2019   Lab Results  Component Value Date   TRIG 93 11/30/2019   Lab Results  Component Value Date  CHOLHDL 3.3 11/30/2019   Lab Results  Component Value Date   HGBA1C 5.7 (H) 12/30/2018      Assessment & Plan:   Problem List Items Addressed This Visit      Cardiovascular and Mediastinum   Essential hypertension     Musculoskeletal and Integument   Polyarthritis - Primary   Relevant Medications   DULoxetine (CYMBALTA) 30 MG capsule   predniSONE (DELTASONE) 20 MG tablet    Other Visit Diagnoses    Moderate episode of recurrent major depressive disorder (HCC)       Relevant Medications   DULoxetine (CYMBALTA) 30 MG capsule   hydrOXYzine (ATARAX/VISTARIL) 50 MG tablet   Psychophysiological insomnia       Relevant Medications   hydrOXYzine (ATARAX/VISTARIL) 50 MG tablet    1.  Polyarthritis Patient continues to have a depressed state, extreme grief from the loss of his son and wife.  Patient would benefit from an antidepressant, encouraged trial of Cymbalta for depression and pain relief.  Patient agrees to meet with clinic counselor to discuss potential counseling.  Trial prednisone taper, continue over-the-counter pain relievers or previously prescribed Mobic as needed.  Patient will follow-up in mobile unit in 3 weeks. - DULoxetine (CYMBALTA) 30 MG capsule; Take 1 tab PO qam for one week, then take 2 tabs PO qam  Dispense: 60 capsule; Refill: 2 - predniSONE (DELTASONE) 20 MG tablet; Take 3 tabs PO for one week, then taper 2 tabs PO for one week, then 1 tab PO for one week  Dispense: 42 tablet; Refill: 0 - ketorolac (TORADOL) injection 60 mg - methylPREDNISolone acetate (DEPO-MEDROL) injection 80 mg  2. Essential hypertension Continue current regimen  3. Moderate episode of recurrent major depressive disorder (HCC)  - DULoxetine (CYMBALTA) 30 MG capsule; Take 1 tab PO qam for one week, then take 2 tabs PO qam  Dispense: 60 capsule; Refill: 2  4. Psychophysiological insomnia Trial hydroxyzine - hydrOXYzine (ATARAX/VISTARIL) 50 MG tablet; Take 1/2 - 1 full tab QHS PRN for insomnia  Dispense: 30 tablet; Refill: 0    Meds ordered this encounter  Medications  . DULoxetine (CYMBALTA) 30 MG capsule    Sig: Take 1 tab PO qam for one week, then take 2 tabs PO qam    Dispense:  60 capsule    Refill:  2    Order Specific Question:   Supervising Provider    Answer:   Asencion Noble E [[id number]]  . DISCONTD: hydrOXYzine (ATARAX/VISTARIL) 50 MG tablet    Sig: Take 1 tablet (50 mg total) by mouth 3 (three) times daily as needed.    Dispense:  30 tablet    Refill:  0    Order Specific Question:   Supervising Provider    Answer:   Joya Gaskins, PATRICK E [[id number]]  . predniSONE (DELTASONE) 20 MG tablet    Sig: Take 3 tabs PO for one week, then taper 2 tabs PO for one week,  then 1 tab PO for one week    Dispense:  42 tablet    Refill:  0    Order Specific Question:   Supervising Provider    Answer:   Asencion Noble E [[id number]]  . hydrOXYzine (ATARAX/VISTARIL) 50 MG tablet    Sig: Take 1/2 - 1 full tab QHS PRN for insomnia    Dispense:  30 tablet    Refill:  0    Please cancel TID script    Order Specific Question:   Supervising Provider  Answer:   Elsie Stain [[id number]]  . ketorolac (TORADOL) injection 60 mg  . methylPREDNISolone acetate (DEPO-MEDROL) injection 80 mg    I have reviewed the patient's medical history (PMH, PSH, Social History, Family History, Medications, and allergies) , and have been updated if relevant. I spent 30 minutes reviewing chart and  face to face time with patient.    Follow-up: Return in about 3 weeks (around 01/16/2020).    Loraine Grip Jashayla Glatfelter, PA-C

## 2019-12-26 NOTE — Progress Notes (Signed)
Patient presents with a complaint of generalized body pain at a 10. Patient has not taken medication today. Patient states he is unable to work due to the whole body discomfort.

## 2019-12-26 NOTE — Patient Instructions (Addendum)
Please take Cymbalta 30 mg once daily for one week, then increase to 2 tabs (60 mg) once daily  Please take prednisone 60 mg for one week, then 40 mg for one week, then 20 mg for one week  Please take 1/2 - 1 full tablet of hydroxyzine at bedtime to help you sleep.  Please keep your appointment with counselor to discuss need for grief therapy  Please return to the mobile unit in 3 weeks  I hope that you feel better soon  Austin Rad, PA-C Physician Assistant [facility] [email or web address]    Myofascial Pain Syndrome and Fibromyalgia Myofascial pain syndrome and fibromyalgia are both pain disorders. This pain may be felt mainly in your muscles.  Myofascial pain syndrome: ? Always has tender points in the muscle that will cause pain when pressed (trigger points). The pain may come and go. ? Usually affects your neck, upper back, and shoulder areas. The pain often radiates into your arms and hands.  Fibromyalgia: ? Has muscle pains and tenderness that come and go. ? Is often associated with fatigue and sleep problems. ? Has trigger points. ? Tends to be long-lasting (chronic), but is not life-threatening. Fibromyalgia and myofascial pain syndrome are not the same. However, they often occur together. If you have both conditions, each can make the other worse. Both are common and can cause enough pain and fatigue to make day-to-day activities difficult. Both can be hard to diagnose because their symptoms are common in many other conditions. What are the causes? The exact causes of these conditions are not known. What increases the risk? You are more likely to develop this condition if:  You have a family history of the condition.  You have certain triggers, such as: ? Spine disorders. ? An injury (trauma) or other physical stressors. ? Being under a lot of stress. ? Medical conditions such as osteoarthritis, rheumatoid  arthritis, or lupus. What are the signs or symptoms? Fibromyalgia The main symptom of fibromyalgia is widespread pain and tenderness in your muscles. Pain is sometimes described as stabbing, shooting, or burning. You may also have:  Tingling or numbness.  Sleep problems and fatigue.  Problems with attention and concentration (fibro fog). Other symptoms may include:  Bowel and bladder problems.  Headaches.  Visual problems.  Problems with odors and noises.  Depression or mood changes.  Painful menstrual periods (dysmenorrhea).  Dry skin or eyes. These symptoms can vary over time. Myofascial pain syndrome Symptoms of myofascial pain syndrome include:  Tight, ropy bands of muscle.  Uncomfortable sensations in muscle areas. These may include aching, cramping, burning, numbness, tingling, and weakness.  Difficulty moving certain parts of the body freely (poor range of motion). How is this diagnosed? This condition may be diagnosed by your symptoms and medical history. You will also have a physical exam. In general:  Fibromyalgia is diagnosed if you have pain, fatigue, and other symptoms for more than 3 months, and symptoms cannot be explained by another condition.  Myofascial pain syndrome is diagnosed if you have trigger points in your muscles, and those trigger points are tender and cause pain elsewhere in your body (referred pain). How is this treated? Treatment for these conditions depends on the type that you have.  For fibromyalgia: ? Pain medicines, such as NSAIDs. ? Medicines for treating depression. ? Medicines for treating seizures. ? Medicines that relax the muscles.  For myofascial pain: ? Pain medicines, such as NSAIDs. ? Cooling and stretching of muscles. ?  Trigger point injections. ? Sound wave (ultrasound) treatments to stimulate muscles. Treating these conditions often requires a team of health care providers. These may include:  Your primary care  provider.  Physical therapist.  Complementary health care providers, such as massage therapists or acupuncturists.  Psychiatrist for cognitive behavioral therapy. Follow these instructions at home: Medicines  Take over-the-counter and prescription medicines only as told by your health care provider.  Do not drive or use heavy machinery while taking prescription pain medicine.  If you are taking prescription pain medicine, take actions to prevent or treat constipation. Your health care provider may recommend that you: ? Drink enough fluid to keep your urine pale yellow. ? Eat foods that are high in fiber, such as fresh fruits and vegetables, whole grains, and beans. ? Limit foods that are high in fat and processed sugars, such as fried or sweet foods. ? Take an over-the-counter or prescription medicine for constipation. Lifestyle   Exercise as directed by your health care provider or physical therapist.  Practice relaxation techniques to control your stress. You may want to try: ? Biofeedback. ? Visual imagery. ? Hypnosis. ? Muscle relaxation. ? Yoga. ? Meditation.  Maintain a healthy lifestyle. This includes eating a healthy diet and getting enough sleep.  Do not use any products that contain nicotine or tobacco, such as cigarettes and e-cigarettes. If you need help quitting, ask your health care provider. General instructions  Talk to your health care provider about complementary treatments, such as acupuncture or massage.  Consider joining a support group with others who are diagnosed with this condition.  Do not do activities that stress or strain your muscles. This includes repetitive motions and heavy lifting.  Keep all follow-up visits as told by your health care provider. This is important. Where to find more information  National Fibromyalgia Association: www.fmaware.[place]: www.arthritis.org  American Chronic Pain Association:  www.theacpa.org Contact a health care provider if:  You have new symptoms.  Your symptoms get worse or your pain is severe.  You have side effects from your medicines.  You have trouble sleeping.  Your condition is causing depression or anxiety. Summary  Myofascial pain syndrome and fibromyalgia are pain disorders.  Myofascial pain syndrome has tender points in the muscle that will cause pain when pressed (trigger points). Fibromyalgia also has muscle pains and tenderness that come and go, but this condition is often associated with fatigue and sleep disturbances.  Fibromyalgia and myofascial pain syndrome are not the same but often occur together, causing pain and fatigue that make day-to-day activities difficult.  Treatment for fibromyalgia includes taking medicines to relax the muscles and medicines for pain, depression, or seizures. Treatment for myofascial pain syndrome includes taking medicines for pain, cooling and stretching of muscles, and injecting medicines into trigger points.  Follow your health care provider's instructions for taking medicines and maintaining a healthy lifestyle. This information is not intended to replace advice given to you by your health care provider. Make sure you discuss any questions you have with your health care provider. Document Revised: 06/10/2018 Document Reviewed: 03/03/2017 Elsevier Patient Education  2020 [employer].

## 2020-01-04 ENCOUNTER — Institutional Professional Consult (permissible substitution): Payer: PRIVATE HEALTH INSURANCE | Admitting: Licensed Clinical Social Worker

## 2020-01-16 ENCOUNTER — Ambulatory Visit: Payer: PRIVATE HEALTH INSURANCE

## 2020-01-16 ENCOUNTER — Telehealth: Payer: Self-pay

## 2020-01-16 NOTE — Telephone Encounter (Signed)
Contacted patient to reschedule f/u appt for [facility] to [facility].

## 2020-01-17 ENCOUNTER — Encounter: Payer: Self-pay | Admitting: Physician Assistant

## 2020-01-17 ENCOUNTER — Other Ambulatory Visit: Payer: Self-pay

## 2020-01-17 ENCOUNTER — Ambulatory Visit: Payer: Self-pay | Attending: Critical Care Medicine | Admitting: Physician Assistant

## 2020-01-17 VITALS — BP 127/81 | HR 85 | Resp 16 | Ht 67.0 in | Wt 193.4 lb

## 2020-01-17 DIAGNOSIS — F4321 Adjustment disorder with depressed mood: Secondary | ICD-10-CM

## 2020-01-17 DIAGNOSIS — R972 Elevated prostate specific antigen [PSA]: Secondary | ICD-10-CM

## 2020-01-17 DIAGNOSIS — L03116 Cellulitis of left lower limb: Secondary | ICD-10-CM

## 2020-01-17 DIAGNOSIS — L723 Sebaceous cyst: Secondary | ICD-10-CM

## 2020-01-17 DIAGNOSIS — Z09 Encounter for follow-up examination after completed treatment for conditions other than malignant neoplasm: Secondary | ICD-10-CM

## 2020-01-17 DIAGNOSIS — Z809 Family history of malignant neoplasm, unspecified: Secondary | ICD-10-CM

## 2020-01-17 DIAGNOSIS — Z2821 Immunization not carried out because of patient refusal: Secondary | ICD-10-CM

## 2020-01-17 DIAGNOSIS — M130 Polyarthritis, unspecified: Secondary | ICD-10-CM

## 2020-01-17 DIAGNOSIS — R739 Hyperglycemia, unspecified: Secondary | ICD-10-CM

## 2020-01-17 MED ORDER — PREDNISONE 10 MG PO TABS: 10.0000 mg | ORAL_TABLET | Freq: Every day | ORAL | 1 refills | Status: DC

## 2020-01-17 NOTE — Progress Notes (Signed)
Patient ID: [name], male   DOB: [birth year], 64 y.o.   MRN: [id number]     [name], is a 64 y.o. male  [id number]  [id number]  DOB - October 02, 1955  Subjective:  Chief Complaint and HPI: [name] is a 64 y.o. male here today for a follow up visit Seen in ED 12/21/2019 for rapidly spreading cellulitis.  His L leg has improved and he finished the antibiotics.  No fever.  Says "I need to be on prednisone."  He says it is the only thing that has ever worked for the polyarthritis.  Seen on mobile unit 12/26/2019 and was continued on prednisone.  He is currently taking 20mg .  He says the joint pain is returning all over.    He also has multiple other issues-needs f/up for urology(elevated PSA) referral, GI referral (for colonoscopy), and referral to have lump removed from his L lower back(been there 20 years.)  Son died from liver CA in about 05-24-2010 and his wife died of breast CA about 11 months later.  He did not start the cymblata but he is open to talking to our Education officer, museum.  No weapons.  Has living siblings and friends that are protective factors   From ED note: 64 year old male with history of polyarthritis, HTN comes in for 3 day history of left lower leg swelling/pain. Denies injury/trauma. States had similar symptoms to his right leg last year requiring debridement at the OR. He has redness/warmth to the foot without obvious spreading. Sharp stabbing pain to the lower leg. Denies fever. Denies recent surgery, immobility. Denies history of blood clots. Denies CP/shob  Chart review showed patient with rapid spreading (within 10 hrs) cellulitis 12/2018 requiring IV abx and irrigation/debridement in the OR. Currently with erythema/warmth to the left dorsal foot. No induration/streaking. However, given history, will cover with keflex. Has interior leg swelling with soft calf. Lower suspicion of DVT, but will order DVT study for evaluation. For now, elevation, ace wrap. Low  threshold for ED visit. Return precautions given. Otherwise PCP follow up for monitoring.  ED/Hospital notes reviewed.   Social History: Family history:  ROS:   Constitutional:  No f/c, No night sweats, No unexplained weight loss. EENT:  No vision changes, No blurry vision, No hearing changes. No mouth, throat, or ear problems.  Respiratory: No cough, No SOB Cardiac: No CP, no palpitations GI:  No abd pain, No N/V/D. GU: No Urinary s/sx Musculoskeletal: see above Neuro: No headache, no dizziness, no motor weakness.  Skin: No rash Endocrine:  No polydipsia. No polyuria.  Psych: Denies SI/HI  No problems updated.  ALLERGIES: No Known Allergies  PAST MEDICAL HISTORY: Past Medical History:  Diagnosis Date  . Erectile dysfunction   . Essential hypertension 11/30/2019  . Gout   . Infected prepatellar bursa, right   . Polyarthritis   . Pre-diabetes     MEDICATIONS AT HOME: Prior to Admission medications   Medication Sig Start Date End Date Taking? Authorizing Provider  atorvastatin (LIPITOR) 10 MG tablet Take 1 tablet (10 mg total) by mouth daily. 12/12/19   Mayers, Cari S, PA-C  cephALEXin (KEFLEX) 500 MG capsule Take 1 capsule (500 mg total) by mouth 4 (four) times daily. 12/21/19   Tasia Catchings, Amy V, PA-C  DULoxetine (CYMBALTA) 30 MG capsule Take 1 tab PO qam for one week, then take 2 tabs PO qam 12/26/19   Mayers, Cari S, PA-C  hydrOXYzine (ATARAX/VISTARIL) 50 MG tablet Take 1/2 - 1 full tab  QHS PRN for insomnia 12/26/19   Mayers, Cari S, PA-C  lisinopril (ZESTRIL) 5 MG tablet Take 1 tablet (5 mg total) by mouth daily. 11/30/19   Mayers, Cari S, PA-C  meloxicam (MOBIC) 15 MG tablet Take 1 tablet (15 mg total) by mouth daily. 12/21/19   Tasia Catchings, Amy V, PA-C  predniSONE (DELTASONE) 10 MG tablet Take 1 tablet (10 mg total) by mouth daily with breakfast. 01/17/20   Argentina Donovan, PA-C  predniSONE (DELTASONE) 20 MG tablet Take 3 tabs PO for one week, then taper 2 tabs PO for one week, then  1 tab PO for one week 12/26/19   Mayers, Cari S, PA-C     Objective:  EXAM:   Vitals:   01/17/20 1401  BP: 127/81  Pulse: 85  Resp: 16  SpO2: 97%  Weight: 193 lb 6.4 oz (87.7 kg)  Height: 5\' 7"  (1.702 m)    General appearance : A&OX3. NAD. Non-toxic-appearing HEENT: Atraumatic and Normocephalic.  PERRLA. EOM intact.  Neck: supple, no JVD. No cervical lymphadenopathy. No thyromegaly Chest/Lungs:  Breathing-non-labored, Good air entry bilaterally, breath sounds normal without rales, rhonchi, or wheezing  CVS: S1 S2 regular, no murmurs, gallops, rubs  sebaceous cyst vs lipoma L lower back.   Extremities: Bilateral Lower Ext shows no edema, both legs are warm to touch with = pulse throughout.  LLE no evidence of cellulitis, redness or swelling now Neurology:  CN II-XII grossly intact, Non focal.   Psych:  TP linear. J/I WNL. Normal speech. Appropriate eye contact and affect.  Skin:  No Rash  Data Review Lab Results  Component Value Date   HGBA1C 5.7 (H) 12/30/2018   HGBA1C 6.0 07/08/2016   HGBA1C 6.5 (H) 05/21/2015     Assessment & Plan   1. Influenza vaccine refused   2. Hyperglycemia Likely due to prednisone - Hemoglobin A1c  3. Elevated PSA - Ambulatory referral to Urology  4. Family history of cancer - Ambulatory referral to Gastroenterology  5. Hospital discharge follow-up improved  6. Polyarthritis Prednisone is not a great long-term option but I will continue him on 10mg  dose and defer to PCP going forward - CBC with Differential/Platelet - predniSONE (DELTASONE) 10 MG tablet; Take 1 tablet (10 mg total) by mouth daily with breakfast.  Dispense: 30 tablet; Refill: 1  7. Sebaceous cyst - Ambulatory referral to General Surgery  8. Grief reaction - Ambulatory referral to Social Work  9. Cellulitis of left leg resolved     Patient have been counseled extensively about nutrition and exercise  Return in about 6 weeks (around 02/28/2020) for please  schedule with PCP for complete pysical.  The patient was given clear instructions to go to ER or return to medical center if symptoms don't improve, worsen or new problems develop. The patient verbalized understanding. The patient was told to call to get lab results if they haven't heard anything in the next week.     Freeman Caldron, PA-C [facility], [place]   01/17/2020, 2:33 PM

## 2020-01-18 ENCOUNTER — Other Ambulatory Visit: Payer: Self-pay | Admitting: Physician Assistant

## 2020-01-18 LAB — CBC WITH DIFFERENTIAL/PLATELET
Basophils Absolute: 0 10*3/uL (ref 0.0–0.2)
Basos: 0 %
EOS (ABSOLUTE): 0 10*3/uL (ref 0.0–0.4)
Eos: 0 %
Hematocrit: 39.5 % (ref 37.5–51.0)
Hemoglobin: 13.8 g/dL (ref 13.0–17.7)
Immature Grans (Abs): 0.1 10*3/uL (ref 0.0–0.1)
Immature Granulocytes: 1 %
Lymphocytes Absolute: 1 10*3/uL (ref 0.7–3.1)
Lymphs: 7 %
MCH: 31.1 pg (ref 26.6–33.0)
MCHC: 34.9 g/dL (ref 31.5–35.7)
MCV: 89 fL (ref 79–97)
Monocytes Absolute: 0.3 10*3/uL (ref 0.1–0.9)
Monocytes: 2 %
Neutrophils Absolute: 12.2 10*3/uL — ABNORMAL HIGH (ref 1.4–7.0)
Neutrophils: 90 %
Platelets: 283 10*3/uL (ref 150–450)
RBC: 4.44 x10E6/uL (ref 4.14–5.80)
RDW: 12.8 % (ref 11.6–15.4)
WBC: 13.6 10*3/uL — ABNORMAL HIGH (ref 3.4–10.8)

## 2020-01-18 LAB — HEMOGLOBIN A1C
Est. average glucose Bld gHb Est-mCnc: 148 mg/dL
Hgb A1c MFr Bld: 6.8 % — ABNORMAL HIGH (ref 4.8–5.6)

## 2020-01-18 MED ORDER — METFORMIN HCL 500 MG PO TABS: 500.0000 mg | ORAL_TABLET | Freq: Two times a day (BID) | ORAL | 3 refills | Status: DC

## 2020-01-22 ENCOUNTER — Encounter: Payer: Self-pay | Admitting: *Deleted

## 2020-01-31 ENCOUNTER — Telehealth: Payer: Self-pay | Admitting: Licensed Clinical Social Worker

## 2020-01-31 NOTE — Telephone Encounter (Signed)
[facility] called to schedule appt for SW services due to referral from PCP, but the Pt's voicemail was full and they didn't answer.

## 2020-02-03 ENCOUNTER — Emergency Department (HOSPITAL_COMMUNITY)
Admission: EM | Admit: 2020-02-03 | Discharge: 2020-02-03 | Disposition: A | Discharge status: Home / Self Care | Payer: Self-pay | Attending: Emergency Medicine | Admitting: Emergency Medicine

## 2020-02-03 ENCOUNTER — Encounter (HOSPITAL_COMMUNITY): Payer: Self-pay | Admitting: Emergency Medicine

## 2020-02-03 ENCOUNTER — Other Ambulatory Visit: Payer: Self-pay

## 2020-02-03 DIAGNOSIS — Z5321 Procedure and treatment not carried out due to patient leaving prior to being seen by health care provider: Secondary | ICD-10-CM | POA: Insufficient documentation

## 2020-02-03 DIAGNOSIS — R519 Headache, unspecified: Secondary | ICD-10-CM | POA: Insufficient documentation

## 2020-02-03 DIAGNOSIS — R030 Elevated blood-pressure reading, without diagnosis of hypertension: Secondary | ICD-10-CM | POA: Insufficient documentation

## 2020-02-03 NOTE — ED Triage Notes (Signed)
Pt states he has been having his BP checked by the medics while working at the [employer] over the past week and that it has been elevated.  This morning it was 222/100.  States he started taking BP meds 4 weeks ago.  Reports mild headache.  No neuro deficits.

## 2020-02-03 NOTE — ED Triage Notes (Signed)
Pt states he is going to leave and follow up with [facility] or PCP.  Encouraged pt to stay and he declined.

## 2020-02-08 ENCOUNTER — Ambulatory Visit: Payer: Self-pay | Attending: Physician Assistant | Admitting: Physician Assistant

## 2020-02-08 ENCOUNTER — Encounter: Payer: Self-pay | Admitting: Physician Assistant

## 2020-02-08 ENCOUNTER — Other Ambulatory Visit: Payer: Self-pay

## 2020-02-08 VITALS — BP 143/77 | HR 98 | Temp 98.6°F | Resp 18 | Ht 69.0 in | Wt 197.0 lb

## 2020-02-08 DIAGNOSIS — M130 Polyarthritis, unspecified: Secondary | ICD-10-CM

## 2020-02-08 DIAGNOSIS — I10 Essential (primary) hypertension: Secondary | ICD-10-CM

## 2020-02-08 DIAGNOSIS — F4321 Adjustment disorder with depressed mood: Secondary | ICD-10-CM

## 2020-02-08 MED ORDER — LISINOPRIL 10 MG PO TABS: 10.0000 mg | ORAL_TABLET | Freq: Every day | ORAL | 0 refills | Status: DC

## 2020-02-08 MED ORDER — LISINOPRIL 10 MG PO TABS: 5.0000 mg | ORAL_TABLET | Freq: Every day | ORAL | 0 refills | Status: DC

## 2020-02-08 NOTE — Progress Notes (Signed)
Patient has not taken BP medication and patient has eaten today. Patient denies pain at this time. Patient complains of of minimal stiffness. Patient has been checking BP at work with 180's 200's. Patient left AMA from the ED.

## 2020-02-08 NOTE — Patient Instructions (Signed)
We are increasing your lisinopril to 10 mg, I encourage you to check your blood pressure at home on a daily basis, keep a written log and bring that with you when you see Dr. Juleen China on December 20.  I strongly encourage you to start the Cymbalta once a day as we discussed  Please let us know if there is any else we can do for you  Kennieth Rad, PA-C Physician Assistant [place] [email or web address]    Blood Pressure Record Sheet To take your blood pressure, you will need a blood pressure machine. You can buy a blood pressure machine (blood pressure monitor) at your clinic, drug store, or online. When choosing one, consider:  An automatic monitor that has an arm cuff.  A cuff that wraps snugly around your upper arm. You should be able to fit only one finger between your arm and the cuff.  A device that stores blood pressure reading results.  Do not choose a monitor that measures your blood pressure from your wrist or finger. Follow your health care provider's instructions for how to take your blood pressure. To use this form:  Get one reading in the morning (a.m.) before you take any medicines.  Get one reading in the evening (p.m.) before supper.  Take at least 2 readings with each blood pressure check. This makes sure the results are correct. Wait 1-2 minutes between measurements.  Write down the results in the spaces on this form.  Repeat this once a week, or as told by your health care provider.  Make a follow-up appointment with your health care provider to discuss the results. Blood pressure log Date: _______________________  a.m. _____________________(1st reading) _____________________(2nd reading)  p.m. _____________________(1st reading) _____________________(2nd reading) Date: _______________________  a.m. _____________________(1st reading) _____________________(2nd reading)  p.m.  _____________________(1st reading) _____________________(2nd reading) Date: _______________________  a.m. _____________________(1st reading) _____________________(2nd reading)  p.m. _____________________(1st reading) _____________________(2nd reading) Date: _______________________  a.m. _____________________(1st reading) _____________________(2nd reading)  p.m. _____________________(1st reading) _____________________(2nd reading) Date: _______________________  a.m. _____________________(1st reading) _____________________(2nd reading)  p.m. _____________________(1st reading) _____________________(2nd reading) This information is not intended to replace advice given to you by your health care provider. Make sure you discuss any questions you have with your health care provider. Document Revised: 04/16/2017 Document Reviewed: 02/16/2017 Elsevier Patient Education  [place].

## 2020-02-08 NOTE — Progress Notes (Signed)
Established Patient Office Visit  Subjective:  Patient ID: [name], male    DOB: [birth year]  Age: 64 y.o. MRN: [id number]  CC:  Chief Complaint  Patient presents with  . Blood Pressure Check    HPI [name] reports that he has been having elevated bp readings ; checking them at work with readings of 180/100, reports he has been having elevated blood pressure readings at home as well, approximately 160/80.  Denies hypertensive symptoms  Reports that he continues to have all over joint pain and cramping, states that he continues to take prednisone but requests a higher dose.  Reports that he did not start Cymbalta, states that he is concerned about taking too many medications   Past Medical History:  Diagnosis Date  . Erectile dysfunction   . Essential hypertension 11/30/2019  . Gout   . Infected prepatellar bursa, right   . Polyarthritis   . Pre-diabetes     Past Surgical History:  Procedure Laterality Date  . IRRIGATION AND DEBRIDEMENT KNEE Right 12/28/2018   Procedure: IRRIGATION AND DEBRIDEMENT KNEE;  Surgeon: Mcarthur Rossetti, MD;  Location: [facility];  Service: Orthopedics;  Laterality: Right;  . KNEE SURGERY    . ROTATOR CUFF REPAIR    . THUMB ARTHROSCOPY Right 2007    Family History  Problem Relation Age of Onset  . Breast cancer Mother   . Prostate cancer Father   . Hypertension Father   . Lung cancer Son     Social History   Socioeconomic History  . Marital status: Married    Spouse name: Not on file  . Number of children: Not on file  . Years of education: Not on file  . Highest education level: Not on file  Occupational History  . Not on file  Tobacco Use  . Smoking status: Never Smoker  . Smokeless tobacco: Never Used  Vaping Use  . Vaping Use: Never used  Substance and Sexual Activity  . Alcohol use: No    Alcohol/week: 0.0 standard drinks  . Drug use: No  . Sexual activity: Not Currently  Other Topics Concern  . Not on  file  Social History Narrative  . Not on file   Social Determinants of Health   Financial Resource Strain: Not on file  Food Insecurity: Not on file  Transportation Needs: Not on file  Physical Activity: Not on file  Stress: Not on file  Social Connections: Not on file  Intimate Partner Violence: Not on file    Outpatient Medications Prior to Visit  Medication Sig Dispense Refill  . atorvastatin (LIPITOR) 10 MG tablet Take 1 tablet (10 mg total) by mouth daily. 30 tablet 1  . DULoxetine (CYMBALTA) 30 MG capsule Take 1 tab PO qam for one week, then take 2 tabs PO qam 60 capsule 2  . hydrOXYzine (ATARAX/VISTARIL) 50 MG tablet Take 1/2 - 1 full tab QHS PRN for insomnia 30 tablet 0  . meloxicam (MOBIC) 15 MG tablet Take 1 tablet (15 mg total) by mouth daily. 30 tablet 0  . metFORMIN (GLUCOPHAGE) 500 MG tablet Take 1 tablet (500 mg total) by mouth 2 (two) times daily with a meal. 180 tablet 3  . lisinopril (ZESTRIL) 5 MG tablet Take 1 tablet (5 mg total) by mouth daily. 30 tablet 3  . cephALEXin (KEFLEX) 500 MG capsule Take 1 capsule (500 mg total) by mouth 4 (four) times daily. 28 capsule 0  . predniSONE (DELTASONE) 10 MG tablet Take 1  tablet (10 mg total) by mouth daily with breakfast. 30 tablet 1  . predniSONE (DELTASONE) 20 MG tablet Take 3 tabs PO for one week, then taper 2 tabs PO for one week, then 1 tab PO for one week 42 tablet 0   No facility-administered medications prior to visit.    No Known Allergies  ROS Review of Systems  Constitutional: Negative.   HENT: Negative.   Eyes: Negative.   Respiratory: Negative for shortness of breath and wheezing.   Cardiovascular: Negative for chest pain and palpitations.  Gastrointestinal: Negative.   Endocrine: Negative.   Genitourinary: Negative.   Musculoskeletal: Positive for arthralgias and joint swelling.  Skin: Negative.   Allergic/Immunologic: Negative.   Neurological: Negative.   Hematological: Negative.    Psychiatric/Behavioral: Positive for dysphoric mood. Negative for self-injury and suicidal ideas.      Objective:    Physical Exam Vitals and nursing note reviewed.   BP (!) 143/77 (BP Location: Left Arm, Patient Position: Sitting, Cuff Size: Large)   Pulse 98   Temp 98.6 F (37 C) (Oral)   Resp 18   Ht 5\' 9"  (1.753 m)   Wt 197 lb (89.4 kg)   SpO2 98%   BMI 29.09 kg/m   General Appearance:    Alert, cooperative, no distress, appears stated age  Head:    Normocephalic, without obvious abnormality, atraumatic  Eyes:    PERRL, conjunctiva/corneas clear, EOM's intact, fundi    benign, both eyes       Ears:    Normal TM's and external ear canals, both ears  Nose:   Nares normal, septum midline, mucosa normal, no drainage   or sinus tenderness  Throat:   Lips, mucosa, and tongue normal; teeth and gums normal  Neck:   Supple, symmetrical, trachea midline, no adenopathy;       thyroid:  No enlargement/tenderness/nodules; no carotid   bruit or JVD  Back:     Symmetric, no curvature, ROM normal, no CVA tenderness  Lungs:     Clear to auscultation bilaterally, respirations unlabored  Chest wall:    No tenderness or deformity  Heart:    Regular rate and rhythm, S1 and S2 normal, no murmur, rub   or gallop           Extremities:   Extremities normal, atraumatic, no cyanosis or edema  Pulses:   2+ and symmetric all extremities  Skin:   Skin color, texture, turgor normal, no rashes or lesions  Lymph nodes:   Cervical, supraclavicular, and axillary nodes normal  Neurologic:   CNII-XII intact. Normal strength, sensation and reflexes      throughout      BP (!) 143/77 (BP Location: Left Arm, Patient Position: Sitting, Cuff Size: Large)   Pulse 98   Temp 98.6 F (37 C) (Oral)   Resp 18   Ht 5\' 9"  (1.753 m)   Wt 197 lb (89.4 kg)   SpO2 98%   BMI 29.09 kg/m  Wt Readings from Last 3 Encounters:  02/08/20 197 lb (89.4 kg)  01/17/20 193 lb 6.4 oz (87.7 kg)  12/26/19 190 lb  (86.2 kg)     Health Maintenance Due  Topic Date Due  . TETANUS/TDAP  Never done  . COLON CANCER SCREENING ANNUAL FOBT  Never done  . COVID-19 Vaccine (3 - Booster for Pfizer series) 12/07/2019  . PNA vac Low Risk Adult (1 of 2 - PCV13) Never done    There are no preventive care reminders  to display for this patient.  Lab Results  Component Value Date   TSH 0.779 12/12/2019   Lab Results  Component Value Date   WBC 13.6 (H) 01/17/2020   HGB 13.8 01/17/2020   HCT 39.5 01/17/2020   MCV 89 01/17/2020   PLT 283 01/17/2020   Lab Results  Component Value Date   NA 137 12/22/2019   K 3.8 12/22/2019   CO2 21 (L) 12/22/2019   GLUCOSE 169 (H) 12/22/2019   BUN 9 12/22/2019   CREATININE 1.01 12/22/2019   BILITOT 0.7 11/30/2019   ALKPHOS 119 11/30/2019   AST 16 11/30/2019   ALT 50 (H) 01/01/2019   PROT 7.5 11/30/2019   ALBUMIN 4.5 11/30/2019   CALCIUM 8.8 (L) 12/22/2019   ANIONGAP 10 12/22/2019   Lab Results  Component Value Date   CHOL 193 11/30/2019   Lab Results  Component Value Date   HDL 59 11/30/2019   Lab Results  Component Value Date   LDLCALC 117 (H) 11/30/2019   Lab Results  Component Value Date   TRIG 93 11/30/2019   Lab Results  Component Value Date   CHOLHDL 3.3 11/30/2019   Lab Results  Component Value Date   HGBA1C 6.8 (H) 01/17/2020      Assessment & Plan:   Problem List Items Addressed This Visit      Cardiovascular and Mediastinum   Essential hypertension - Primary   Relevant Medications   lisinopril (ZESTRIL) 10 MG tablet     Musculoskeletal and Integument   Polyarthritis     Other   Grief reaction    1. Essential hypertension Increase lisinopril 10 mg.  Keep written log of blood pressure readings, patient has upcoming appointment to establish care with Dr. Juleen China on December 20, encourage patient to take blood pressure log to office visit - lisinopril (ZESTRIL) 10 MG tablet; Take 0.5 tablets (5 mg total) by mouth daily.   Dispense: 30 tablet; Refill: 0  2. Grief reaction   3. Polyarthritis Encourage patient to begin trial of Cymbalta as discussed at previous office visit, patient understands and agrees   I have reviewed the patient's medical history (PMH, PSH, Social History, Family History, Medications, and allergies) , and have been updated if relevant. I spent 30 minutes reviewing chart and  face to face time with patient.     Meds ordered this encounter  Medications  . DISCONTD: lisinopril (ZESTRIL) 10 MG tablet    Sig: Take 0.5 tablets (5 mg total) by mouth daily.    Dispense:  30 tablet    Refill:  0    Order Specific Question:   Supervising Provider    Answer:   Joya Gaskins, PATRICK E [[id number]]  . lisinopril (ZESTRIL) 10 MG tablet    Sig: Take 1 tablet (10 mg total) by mouth daily.    Dispense:  30 tablet    Refill:  0    Dose change    Order Specific Question:   Supervising Provider    Answer:   Elsie Stain [[id number]]    Follow-up: Return if symptoms worsen or fail to improve.    Loraine Grip Aubri Gathright, PA-C

## 2020-02-09 ENCOUNTER — Encounter: Payer: Self-pay | Admitting: Physician Assistant

## 2020-02-09 ENCOUNTER — Telehealth: Payer: Self-pay | Admitting: Licensed Clinical Social Worker

## 2020-02-09 DIAGNOSIS — F4321 Adjustment disorder with depressed mood: Secondary | ICD-10-CM | POA: Insufficient documentation

## 2020-02-09 DIAGNOSIS — F4320 Adjustment disorder, unspecified: Secondary | ICD-10-CM | POA: Insufficient documentation

## 2020-02-09 NOTE — Telephone Encounter (Signed)
Call placed to patient regarding [facility] referral. LCSW introduced self and explained role at [facility]. Pt reports that he is doing well and is not interested in scheduling an appointment at this time. He agreed to contact clinic if any behavioral health and/or resource needs arise.

## 2020-02-19 ENCOUNTER — Ambulatory Visit: Payer: PRIVATE HEALTH INSURANCE | Admitting: Internal Medicine

## 2020-03-12 ENCOUNTER — Ambulatory Visit: Payer: Self-pay | Admitting: Internal Medicine

## 2020-03-13 ENCOUNTER — Telehealth: Payer: Self-pay

## 2020-03-13 NOTE — Telephone Encounter (Signed)
ATC pt to confirm in-person appt tomorrow, no answer, LM to RC/informing of appt

## 2020-03-14 ENCOUNTER — Ambulatory Visit (INDEPENDENT_AMBULATORY_CARE_PROVIDER_SITE_OTHER): Payer: PRIVATE HEALTH INSURANCE | Admitting: Internal Medicine

## 2020-03-14 ENCOUNTER — Telehealth: Payer: Self-pay

## 2020-03-14 ENCOUNTER — Other Ambulatory Visit: Payer: Self-pay

## 2020-03-14 ENCOUNTER — Encounter: Payer: Self-pay | Admitting: Internal Medicine

## 2020-03-14 VITALS — BP 150/76 | HR 85 | Temp 98.4°F | Resp 18 | Ht 67.5 in | Wt 196.4 lb

## 2020-03-14 DIAGNOSIS — I10 Essential (primary) hypertension: Secondary | ICD-10-CM

## 2020-03-14 DIAGNOSIS — D171 Benign lipomatous neoplasm of skin and subcutaneous tissue of trunk: Secondary | ICD-10-CM

## 2020-03-14 DIAGNOSIS — E559 Vitamin D deficiency, unspecified: Secondary | ICD-10-CM | POA: Diagnosis not present

## 2020-03-14 DIAGNOSIS — M2550 Pain in unspecified joint: Secondary | ICD-10-CM

## 2020-03-14 DIAGNOSIS — Z1211 Encounter for screening for malignant neoplasm of colon: Secondary | ICD-10-CM | POA: Diagnosis not present

## 2020-03-14 DIAGNOSIS — R972 Elevated prostate specific antigen [PSA]: Secondary | ICD-10-CM

## 2020-03-14 MED ORDER — PREDNISONE 10 MG PO TABS: 10.0000 mg | ORAL_TABLET | Freq: Every day | ORAL | 0 refills | Status: DC

## 2020-03-14 MED ORDER — LISINOPRIL 40 MG PO TABS: 40.0000 mg | ORAL_TABLET | Freq: Every day | ORAL | 1 refills | Status: DC

## 2020-03-14 NOTE — Progress Notes (Signed)
Subjective:    [name] - 65 y.o. male MRN [id number]  Date of birth: Sep 22, 1955  HPI  [name] is here for f/u HTN.  Chronic HTN Disease Monitoring:  Home BP Monitoring - 140s/70s---reports no change with dose increase of Lisinopril 5 to 10 mg  Chest pain- no  Dyspnea- no Headache - no  Medications: Lisinopril 10 mg  Compliance- yes Lightheadedness- no  Edema- no   Complains of diffuse joint pain. This seems to occur every year. Says not taking Lipitor and never picked up medication. Not taking Cymbalta.   Has a lipoma versus sebaceous cyst present on left lower back. Has been there for about 20 years. Thinks has been getting bigger. Wants to have it removed.   Health Maintenance:  Health Maintenance Due  Topic Date Due  . COLON CANCER SCREENING ANNUAL FOBT  Never done  . COVID-19 Vaccine (3 - Booster for Pfizer series) 12/07/2019    -  reports that he has never smoked. He has never used smokeless tobacco. - Review of Systems: Per HPI. - Past Medical History: Patient Active Problem List   Diagnosis Date Noted  . Grief reaction 02/09/2020  . Influenza vaccine refused 01/17/2020  . Hyperlipidemia 12/12/2019  . Elevated PSA, less than 10 ng/ml 12/12/2019  . Vitamin D deficiency 12/12/2019  . Abnormal thyroid blood test 12/12/2019  . Essential hypertension 11/30/2019  . Subcutaneous nodule of back 11/30/2019  . Prepatellar abscess 12/29/2018  . Cellulitis of right knee 12/28/2018  . Infected prepatellar bursa, right   . Prediabetes 07/08/2016  . Elevated blood pressure reading without diagnosis of hypertension 06/01/2016  . Dental caries 05/21/2015  . Thrombocytosis 04/24/2015  . Erectile dysfunction 04/23/2015  . Polyarthritis 03/08/2015  . ROTATOR CUFF INJURY, LEFT SHOULDER 06/20/2009   - Medications: reviewed and updated   Objective:   Physical Exam BP (!) 150/76 (BP Location: Right Arm, Patient Position: Sitting, Cuff Size:  Normal)   Pulse 85   Temp 98.4 F (36.9 C) (Oral)   Resp 18   Ht 5' 7.5" (1.715 m)   Wt 196 lb 6.4 oz (89.1 kg)   SpO2 94%   BMI 30.31 kg/m  Physical Exam Constitutional:      General: He is not in acute distress.    Appearance: He is not diaphoretic.  HENT:     Head: Normocephalic and atraumatic.  Eyes:     Extraocular Movements: EOM normal.     Conjunctiva/sclera: Conjunctivae normal.  Cardiovascular:     Rate and Rhythm: Normal rate and regular rhythm.     Heart sounds: Normal heart sounds. No murmur heard.   Pulmonary:     Effort: Pulmonary effort is normal. No respiratory distress.     Breath sounds: Normal breath sounds.  Musculoskeletal:        General: Normal range of motion.  Skin:    General: Skin is warm and dry.     Comments: Approximately 7-8 raised circumferential non-fixed skin lesion on left lower back consistent with sebaceous cyst versus lipoma.   Neurological:     Mental Status: He is alert and oriented to person, place, and time.  Psychiatric:        Mood and Affect: Affect normal.        Judgment: Judgment normal.            Assessment & Plan:   1. Essential hypertension BP remains above goal in office and on home monitoring. Asymptomatic. Increase Lisinopril to 40 mg.  Return within 2 weeks for BP check RN and BMET for lab monitoring.  - lisinopril (ZESTRIL) 40 MG tablet; Take 1 tablet (40 mg total) by mouth daily.  Dispense: 90 tablet; Refill: 1 - Basic metabolic panel; Future  2. Vitamin D deficiency Vit D level 19 in Sept 2021. Monitor.  - VITAMIN D 25 Hydroxy (Vit-D Deficiency, Fractures)  3. Polyarthralgia Patient with diffuse polyarthralgia. Has been managing joint pain with chronic low dose Prednisone. Discussed with patient that this medication is not appropriate with long term use if avoidable. Noted CRP 10.4 in Oct 2020. Will repeat and also obtain other labs investigating etiologies of joint pain. Will give 30 day supply of  Prednisone.  - Basic metabolic panel - CBC - Rheumatoid factor - Sedimentation Rate - C-reactive protein - ANA,IFA RA Diag Pnl w/rflx Tit/Patn - CYCLIC CITRUL PEPTIDE ANTIBODY, IGG/IGA - predniSONE (DELTASONE) 10 MG tablet; Take 1 tablet (10 mg total) by mouth daily with breakfast.  Dispense: 30 tablet; Refill: 0  4. Screening for colon cancer - Ambulatory referral to Gastroenterology  5. Lipoma of torso - Ambulatory referral to General Surgery  6. Elevated PSA PSA 6.4 in Sept 2021. Urology referral was placed but closed as were unable to reach patient. Discussed importance of following up. New referral placed.  - Ambulatory referral to Urology   Phill Myron, D.O. 03/14/2020, 4:14 PM Primary Care at [facility]

## 2020-03-14 NOTE — Progress Notes (Signed)
Requesting medication for pain/stiffness to hands, completed Prednisone course yesterday

## 2020-03-14 NOTE — Telephone Encounter (Signed)
I sent in 30 day supply. Please inform patient as we discussed, this is not a good long term medication and I will not plan to refill chronically.   Phill Myron, D.O. Primary Care at [facility]  03/14/2020, 9:52 PM

## 2020-03-14 NOTE — Telephone Encounter (Signed)
Patient requesting medication refill

## 2020-03-15 LAB — SEDIMENTATION RATE: Sed Rate: 23 mm/hr (ref 0–30)

## 2020-03-15 LAB — BASIC METABOLIC PANEL
BUN/Creatinine Ratio: 9 — ABNORMAL LOW (ref 10–24)
BUN: 8 mg/dL (ref 8–27)
CO2: 19 mmol/L — ABNORMAL LOW (ref 20–29)
Calcium: 9.5 mg/dL (ref 8.6–10.2)
Chloride: 103 mmol/L (ref 96–106)
Creatinine, Ser: 0.92 mg/dL (ref 0.76–1.27)
GFR calc Af Amer: 101 mL/min/{1.73_m2} (ref >59)
GFR calc non Af Amer: 87 mL/min/{1.73_m2} (ref >59)
Glucose: 131 mg/dL — ABNORMAL HIGH (ref 65–99)
Potassium: 4.4 mmol/L (ref 3.5–5.2)
Sodium: 137 mmol/L (ref 134–144)

## 2020-03-15 LAB — CBC
Hematocrit: 39.8 % (ref 37.5–51.0)
Hemoglobin: 13.6 g/dL (ref 13.0–17.7)
MCH: 30.4 pg (ref 26.6–33.0)
MCHC: 34.2 g/dL (ref 31.5–35.7)
MCV: 89 fL (ref 79–97)
Platelets: 372 10*3/uL (ref 150–450)
RBC: 4.48 x10E6/uL (ref 4.14–5.80)
RDW: 13.5 % (ref 11.6–15.4)
WBC: 8.9 10*3/uL (ref 3.4–10.8)

## 2020-03-15 LAB — VITAMIN D 25 HYDROXY (VIT D DEFICIENCY, FRACTURES): Vit D, 25-Hydroxy: 15.1 ng/mL — ABNORMAL LOW (ref 30.0–100.0)

## 2020-03-15 LAB — RHEUMATOID FACTOR: Rheumatoid fact SerPl-aCnc: <10 IU/mL (ref <14.0)

## 2020-03-15 LAB — C-REACTIVE PROTEIN: CRP: 5 mg/L (ref 0–10)

## 2020-03-16 LAB — ANA,IFA RA DIAG PNL W/RFLX TIT/PATN
ANA Titer 1: NEGATIVE
Cyclic Citrullin Peptide Ab: 1 units (ref 0–19)
Rheumatoid fact SerPl-aCnc: <10 IU/mL (ref <14.0)

## 2020-03-18 ENCOUNTER — Telehealth: Payer: Self-pay

## 2020-03-18 NOTE — Telephone Encounter (Signed)
Reached out to patient by phone to confirm medication refill and pharmacy pickup.

## 2020-03-18 NOTE — Telephone Encounter (Signed)
Contacted patient by phone, LVM to notify them of recent medication refill.

## 2020-03-22 ENCOUNTER — Other Ambulatory Visit: Payer: Self-pay | Admitting: Internal Medicine

## 2020-03-22 MED ORDER — VITAMIN D (ERGOCALCIFEROL) 1.25 MG (50000 UNIT) PO CAPS: 50000.0000 [IU] | ORAL_CAPSULE | ORAL | 0 refills | Status: DC

## 2020-03-25 NOTE — Progress Notes (Signed)
UTC letter mailed after failed contact attempts to discuss lab results/nmew medication

## 2020-03-28 ENCOUNTER — Other Ambulatory Visit: Payer: PRIVATE HEALTH INSURANCE

## 2020-03-28 ENCOUNTER — Ambulatory Visit: Payer: PRIVATE HEALTH INSURANCE

## 2020-04-17 ENCOUNTER — Encounter: Payer: Self-pay | Admitting: Surgery

## 2020-04-17 ENCOUNTER — Other Ambulatory Visit: Payer: Self-pay

## 2020-04-17 ENCOUNTER — Ambulatory Visit (INDEPENDENT_AMBULATORY_CARE_PROVIDER_SITE_OTHER): Payer: PRIVATE HEALTH INSURANCE | Admitting: Surgery

## 2020-04-17 ENCOUNTER — Ambulatory Visit: Payer: Self-pay | Admitting: Urology

## 2020-04-17 ENCOUNTER — Ambulatory Visit: Payer: Self-pay | Admitting: Surgery

## 2020-04-17 VITALS — BP 158/79 | HR 91 | Temp 98.4°F | Ht 67.5 in | Wt 194.4 lb

## 2020-04-17 DIAGNOSIS — L720 Epidermal cyst: Secondary | ICD-10-CM | POA: Diagnosis not present

## 2020-04-17 NOTE — H&P (View-Only) (Signed)
04/17/2020  Reason for Visit:  Left lower back mass  Referring Provider:  Phill Myron, DO  History of Present Illness: [name] is a 65 y.o. male presenting for evaluation of a left lower back mass.  The patient reports that he's had this for many years, at least 16, perhaps 46.  Initially it never worried him, but more recently, he's also felt that it's grown.  He also reports that part of his motivation is to make sure it is not cancer.  He reports there is an extensive history of cancer in the family with breast, prostate, and lung cancer.  From the mass standpoint, he denies any drainage, tenderness.  His main complaint is the size and possible growth.  Past Medical History: Past Medical History:  Diagnosis Date  . Erectile dysfunction   . Essential hypertension 11/30/2019  . Gout   . Infected prepatellar bursa, right   . Polyarthritis   . Pre-diabetes      Past Surgical History: Past Surgical History:  Procedure Laterality Date  . IRRIGATION AND DEBRIDEMENT KNEE Right 12/28/2018   Procedure: IRRIGATION AND DEBRIDEMENT KNEE;  Surgeon: Mcarthur Rossetti, MD;  Location: [facility];  Service: Orthopedics;  Laterality: Right;  . KNEE SURGERY    . ROTATOR CUFF REPAIR    . THUMB ARTHROSCOPY Right 2007    Home Medications: Prior to Admission medications   Medication Sig Start Date End Date Taking? Authorizing Provider  lisinopril (ZESTRIL) 40 MG tablet Take 1 tablet (40 mg total) by mouth daily. 03/14/20  Yes Nicolette Bang, DO  predniSONE (DELTASONE) 10 MG tablet Take 1 tablet (10 mg total) by mouth daily with breakfast. 03/14/20  Yes Nicolette Bang, DO  Vitamin D, Ergocalciferol, (DRISDOL) 1.25 MG (50000 UNIT) CAPS capsule Take 1 capsule (50,000 Units total) by mouth every 7 (seven) days. 03/22/20  Yes Nicolette Bang, DO    Allergies: No Known Allergies  Social History:  reports that he has never smoked. He has never used smokeless  tobacco. He reports that he does not drink alcohol and does not use drugs.   Family History: Family History  Problem Relation Age of Onset  . Breast cancer Mother   . Prostate cancer Father   . Hypertension Father   . Lung cancer Son     Review of Systems: Review of Systems  Constitutional: Negative for chills and fever.  Respiratory: Negative for shortness of breath.   Cardiovascular: Negative for chest pain.  Gastrointestinal: Negative for abdominal pain, nausea and vomiting.  Skin: Negative for rash.    Physical Exam BP (!) 158/79   Pulse 91   Temp 98.4 F (36.9 C) (Oral)   Ht 5' 7.5" (1.715 m)   Wt 194 lb 6.4 oz (88.2 kg)   SpO2 97%   BMI 30.00 kg/m  CONSTITUTIONAL: No acute distress HEENT:  Normocephalic, atraumatic, extraocular motion intact. RESPIRATORY: Normal respiratory effort without pathologic use of accessory muscles. CARDIOVASCULAR: Regular rhythm and rate. MUSCULOSKELETAL:  Normal muscle strength and tone in all four extremities.  No peripheral edema or cyanosis. SKIN:  The patient has a 7 x 3 cm mass in the left lower back, with a small area of fluctuance in the medial portion.  It is soft, mobile, and nontender.  It also feels superficial, consistent with a large sebaceous cyst.  NEUROLOGIC:  Motor and sensation is grossly normal.  Cranial nerves are grossly intact. PSYCH:  Alert and oriented to person, place and time. Affect is  normal.  Laboratory Analysis: No results found for this or any previous visit (from the past 24 hour(s)).  Imaging: No results found.  Assessment and Plan: This is a 65 y.o. male with likely a large sebaceous cyst of the left lower back.  --Discussed with the patient that this is unlikely to be cancer but we can offer excision.  Discussed with him the options for excision in the office vs the OR under anesthesia, and he would prefer to do this in the OR.  Reviewed with him the risks of bleeding, infection, injury to surrounding  structures, need for COVID-19 testing, and he's willing to proceed.  He's aware that this would be an outpatient procedure.  He would like to schedule this on a Thursday.  We will schedule him on 05/02/20.  Will get medical clearance as a precaution.  Face-to-face time spent with the patient and care providers was 40 minutes, with more than 50% of the time spent counseling, educating, and coordinating care of the patient.     Melvyn Neth, [facility]

## 2020-04-17 NOTE — Patient Instructions (Signed)
Our surgery scheduler Barbara will call you within 24-48 hours to get you scheduled. If you have not heard from her after 48 hours, please call our office. You will need to get Covid tested before surgery and have the blue sheet available when she calls to write down important information. If you have any concerns or questions, please feel free to call our office.  

## 2020-04-17 NOTE — Progress Notes (Signed)
04/17/2020  Reason for Visit:  Left lower back mass  Referring Provider:  Phill Myron, DO  History of Present Illness: [name] is a 65 y.o. male presenting for evaluation of a left lower back mass.  The patient reports that he's had this for many years, at least 31, perhaps 55.  Initially it never worried him, but more recently, he's also felt that it's grown.  He also reports that part of his motivation is to make sure it is not cancer.  He reports there is an extensive history of cancer in the family with breast, prostate, and lung cancer.  From the mass standpoint, he denies any drainage, tenderness.  His main complaint is the size and possible growth.  Past Medical History: Past Medical History:  Diagnosis Date  . Erectile dysfunction   . Essential hypertension 11/30/2019  . Gout   . Infected prepatellar bursa, right   . Polyarthritis   . Pre-diabetes      Past Surgical History: Past Surgical History:  Procedure Laterality Date  . IRRIGATION AND DEBRIDEMENT KNEE Right 12/28/2018   Procedure: IRRIGATION AND DEBRIDEMENT KNEE;  Surgeon: Mcarthur Rossetti, MD;  Location: [facility];  Service: Orthopedics;  Laterality: Right;  . KNEE SURGERY    . ROTATOR CUFF REPAIR    . THUMB ARTHROSCOPY Right 2007    Home Medications: Prior to Admission medications   Medication Sig Start Date End Date Taking? Authorizing Provider  lisinopril (ZESTRIL) 40 MG tablet Take 1 tablet (40 mg total) by mouth daily. 03/14/20  Yes Nicolette Bang, DO  predniSONE (DELTASONE) 10 MG tablet Take 1 tablet (10 mg total) by mouth daily with breakfast. 03/14/20  Yes Nicolette Bang, DO  Vitamin D, Ergocalciferol, (DRISDOL) 1.25 MG (50000 UNIT) CAPS capsule Take 1 capsule (50,000 Units total) by mouth every 7 (seven) days. 03/22/20  Yes Nicolette Bang, DO    Allergies: No Known Allergies  Social History:  reports that he has never smoked. He has never used smokeless  tobacco. He reports that he does not drink alcohol and does not use drugs.   Family History: Family History  Problem Relation Age of Onset  . Breast cancer Mother   . Prostate cancer Father   . Hypertension Father   . Lung cancer Son     Review of Systems: Review of Systems  Constitutional: Negative for chills and fever.  Respiratory: Negative for shortness of breath.   Cardiovascular: Negative for chest pain.  Gastrointestinal: Negative for abdominal pain, nausea and vomiting.  Skin: Negative for rash.    Physical Exam BP (!) 158/79   Pulse 91   Temp 98.4 F (36.9 C) (Oral)   Ht 5' 7.5" (1.715 m)   Wt 194 lb 6.4 oz (88.2 kg)   SpO2 97%   BMI 30.00 kg/m  CONSTITUTIONAL: No acute distress HEENT:  Normocephalic, atraumatic, extraocular motion intact. RESPIRATORY: Normal respiratory effort without pathologic use of accessory muscles. CARDIOVASCULAR: Regular rhythm and rate. MUSCULOSKELETAL:  Normal muscle strength and tone in all four extremities.  No peripheral edema or cyanosis. SKIN:  The patient has a 7 x 3 cm mass in the left lower back, with a small area of fluctuance in the medial portion.  It is soft, mobile, and nontender.  It also feels superficial, consistent with a large sebaceous cyst.  NEUROLOGIC:  Motor and sensation is grossly normal.  Cranial nerves are grossly intact. PSYCH:  Alert and oriented to person, place and time. Affect is  normal.  Laboratory Analysis: No results found for this or any previous visit (from the past 24 hour(s)).  Imaging: No results found.  Assessment and Plan: This is a 65 y.o. male with likely a large sebaceous cyst of the left lower back.  --Discussed with the patient that this is unlikely to be cancer but we can offer excision.  Discussed with him the options for excision in the office vs the OR under anesthesia, and he would prefer to do this in the OR.  Reviewed with him the risks of bleeding, infection, injury to surrounding  structures, need for COVID-19 testing, and he's willing to proceed.  He's aware that this would be an outpatient procedure.  He would like to schedule this on a Thursday.  We will schedule him on 05/02/20.  Will get medical clearance as a precaution.  Face-to-face time spent with the patient and care providers was 40 minutes, with more than 50% of the time spent counseling, educating, and coordinating care of the patient.     Melvyn Neth, [facility]

## 2020-04-18 ENCOUNTER — Telehealth: Payer: Self-pay | Admitting: Surgery

## 2020-04-18 ENCOUNTER — Telehealth: Payer: Self-pay

## 2020-04-18 NOTE — Telephone Encounter (Signed)
Patient has been advised of Pre-Admission date/time, COVID Testing date and Surgery date.  Surgery Date: 05/02/20 Preadmission Testing Date: 04/26/20 (phone 8a-1p) Covid Testing Date: 04/30/20 - patient advised to go to the [facility] ([place]) between 8a-1p  Patient has been made aware to call [phone], between 1-3:00pm the day before surgery, to find out what time to arrive for surgery.

## 2020-04-18 NOTE — Telephone Encounter (Signed)
Faxed medical clearance to Dr. Phill Myron.

## 2020-04-19 ENCOUNTER — Encounter: Payer: Self-pay | Admitting: Urology

## 2020-04-24 ENCOUNTER — Telehealth: Payer: Self-pay

## 2020-04-24 NOTE — Telephone Encounter (Signed)
Patient scheduled to see Dr.Wallace 04/29/20 @ 10:30 for Medical Clearance.

## 2020-04-26 ENCOUNTER — Encounter
Admission: RE | Admit: 2020-04-26 | Discharge: 2020-04-26 | Disposition: A | Discharge status: Home / Self Care | Payer: PRIVATE HEALTH INSURANCE | Source: Ambulatory Visit | Attending: Surgery | Admitting: Surgery

## 2020-04-26 ENCOUNTER — Other Ambulatory Visit: Payer: Self-pay

## 2020-04-26 NOTE — Patient Instructions (Signed)
Your procedure is scheduled on: 05/02/20 Report to [facility]. To find out your arrival time please call [phone] between 1PM - 3PM on 05/01/20.  Remember: Instructions that are not followed completely may result in serious medical risk, up to and including death, or upon the discretion of your surgeon and anesthesiologist your surgery may need to be rescheduled.     _X__ 1. Do not eat food after midnight the night before your procedure.                 No gum chewing or hard candies. You may drink clear liquids up to 2 hours                 before you are scheduled to arrive for your surgery- DO not drink clear                 liquids within 2 hours of the start of your surgery.                 Clear Liquids include:  water, apple juice without pulp, clear carbohydrate                 drink such as Clearfast or Gatorade, Black Coffee or Tea (Do not add                 anything to coffee or tea). Diabetics water only  __X__2.  On the morning of surgery brush your teeth with toothpaste and water, you                 may rinse your mouth with mouthwash if you wish.  Do not swallow any              toothpaste of mouthwash.     _X__ 3.  No Alcohol for 24 hours before or after surgery.   _X__ 4.  Do Not Smoke or use e-cigarettes For 24 Hours Prior to Your Surgery.                 Do not use any chewable tobacco products for at least 6 hours prior to                 surgery.  ____  5.  Bring all medications with you on the day of surgery if instructed.   __X__  6.  Notify your doctor if there is any change in your medical condition      (cold, fever, infections).     Do not wear jewelry, make-up, hairpins, clips or nail polish. Do not wear lotions, powders, or perfumes.  Do not shave 48 hours prior to surgery. Men may shave face and neck. Do not bring valuables to the hospital.    [facility] is not responsible for any belongings or  valuables.  Contacts, dentures/partials or body piercings may not be worn into surgery. Bring a case for your contacts, glasses or hearing aids, a denture cup will be supplied. Leave your suitcase in the car. After surgery it may be brought to your room. For patients admitted to the hospital, discharge time is determined by your treatment team.   Patients discharged the day of surgery will not be allowed to drive home.   Please read over the following fact sheets that you were given:   MRSA Information  __X__ Take these medicines the morning of surgery with A SIP OF WATER:  1. predniSONE (DELTASONE) 10 MG tablet  2.   3.   4.  5.  6.  ____ Fleet Enema (as directed)   __X__ Use CHG Soap/SAGE wipes as directed  ____ Use inhalers on the day of surgery  ____ Stop metformin/Janumet/Farxiga 2 days prior to surgery    ____ Take 1/2 of usual insulin dose the night before surgery. No insulin the morning          of surgery.   ____ Stop Blood Thinners Coumadin/Plavix/Xarelto/Pleta/Pradaxa/Eliquis/Effient/Aspirin  on   Or contact your Surgeon, Cardiologist or Medical Doctor regarding  ability to stop your blood thinners  __X__ Stop Anti-inflammatories 7 days before surgery such as Advil, Ibuprofen, Motrin,  BC or Goodies Powder, Naprosyn, Naproxen, Aleve, Aspirin    __X__ Stop all herbal supplements, fish oil or vitamin E until after surgery.    ____ Bring C-Pap to the hospital.

## 2020-04-29 ENCOUNTER — Other Ambulatory Visit: Payer: Self-pay

## 2020-04-29 ENCOUNTER — Ambulatory Visit (INDEPENDENT_AMBULATORY_CARE_PROVIDER_SITE_OTHER): Payer: PRIVATE HEALTH INSURANCE | Admitting: Family

## 2020-04-29 ENCOUNTER — Encounter: Payer: Self-pay | Admitting: Family

## 2020-04-29 VITALS — BP 126/80 | HR 90 | Wt 192.4 lb

## 2020-04-29 DIAGNOSIS — M2550 Pain in unspecified joint: Secondary | ICD-10-CM

## 2020-04-29 DIAGNOSIS — Z01810 Encounter for preprocedural cardiovascular examination: Secondary | ICD-10-CM | POA: Diagnosis not present

## 2020-04-29 MED ORDER — PREDNISONE 10 MG PO TABS: 10.0000 mg | ORAL_TABLET | Freq: Every day | ORAL | 0 refills | Status: DC

## 2020-04-29 NOTE — Patient Instructions (Signed)
Referral to Cardiology.   Prednisone for arthralgia.   Follow-up with primary provider as needed.  Joint Pain  Joint pain can be caused by many things. It is likely to go away if you follow instructions from your doctor for taking care of yourself at home. Sometimes, you may need more treatment. Follow these instructions at home: Managing pain, stiffness, and swelling  If told, put ice on the painful area. To do this: ? If you have a removable elastic bandage, sling, or splint, take it off as told by your doctor. ? Put ice in a plastic bag. ? Place a towel between your skin and the bag. ? Leave the ice on for 20 minutes, 2-3 times a day. ? Take off the ice if your skin turns bright red. This is very important. If you cannot feel pain, heat, or cold, you have a greater risk of damage to the area.  Move your fingers or toes below the painful joint often.  Raise the painful joint above the level of your heart while you are sitting or lying down.  If told, put heat on the painful area. Do this as often as told by your doctor. Use the heat source that your doctor recommends, such as a moist heat pack or a heating pad. ? Place a towel between your skin and the heat source. ? Leave the heat on for 20-30 minutes. ? Take off the heat if your skin gets bright red. This is especially important if you are unable to feel pain, heat, or cold. You may have a greater risk of getting burned.      Activity  Rest the painful joint for as long as told by your doctor. Do not do things that cause pain or make your pain worse.  Begin exercising or stretching the affected area, as told by your doctor. Ask your doctor what types of exercise are safe for you.  Return to your normal activities when your doctor says that it is safe. If you have an elastic bandage, sling, or splint:  Wear it as told by your doctor. Take it only as told by your doctor.  Loosen it your fingers or toes below the  joint: ? Tingle. ? Become numb. ? Get cold and blue.  Keep it clean.  Ask your doctor if you should take it off before bathing.  If it is not waterproof: ? Do not let it get wet. ? Cover it with a watertight covering when you take a bath or shower. General instructions  Take over-the-counter and prescription medicines only as told by your doctor. This may include medicines taken by mouth or applied to the skin.  Do not smoke or use any products that contain nicotine or tobacco. If you need help quitting, ask your doctor.  Keep all follow-up visits as told by your doctor. This is important. Contact a doctor if:  You have pain that gets worse and does not get better with medicine.  Your joint pain does not get better in 3 days.  You have more bruising or swelling.  You have a fever.  You lose 10 lb (4.5 kg) or more without trying. Get help right away if:  You cannot move the joint.  Your fingers or toes tingle, become numb. or get cold and blue.  You have a fever along with a joint that is red, warm, and swollen. Summary  Joint pain can be caused by many things. It often goes away if  you follow instructions from your doctor for taking care of yourself at home.  Rest the painful joint for as long as told. Do not do things that cause pain or make your pain worse.  Take over-the-counter and prescription medicines only as told by your doctor. This information is not intended to replace advice given to you by your health care provider. Make sure you discuss any questions you have with your health care provider. Document Revised: 05/31/2019 Document Reviewed: 05/31/2019 Elsevier Patient Education  2021 [employer].

## 2020-04-29 NOTE — Progress Notes (Signed)
Patient ID: [name], male    DOB: 05/17/1955  MRN: [id number]  CC: Surgical Clearance  Subjective: [name] is a 65 y.o. male who presents for surgical clearance for excision of back lesion lower left back cyst scheduled 05/02/2020. Also, requesting refill of Prednisone for diffuse arthralgia.    Patient Active Problem List   Diagnosis Date Noted  . Grief reaction 02/09/2020  . Influenza vaccine refused 01/17/2020  . Hyperlipidemia 12/12/2019  . Elevated PSA, less than 10 ng/ml 12/12/2019  . Vitamin D deficiency 12/12/2019  . Abnormal thyroid blood test 12/12/2019  . Essential hypertension 11/30/2019  . Subcutaneous nodule of back 11/30/2019  . Prepatellar abscess 12/29/2018  . Cellulitis of right knee 12/28/2018  . Infected prepatellar bursa, right   . Prediabetes 07/08/2016  . Elevated blood pressure reading without diagnosis of hypertension 06/01/2016  . Dental caries 05/21/2015  . Thrombocytosis 04/24/2015  . Erectile dysfunction 04/23/2015  . Polyarthritis 03/08/2015  . ROTATOR CUFF INJURY, LEFT SHOULDER 06/20/2009     Current Outpatient Medications on File Prior to Visit  Medication Sig Dispense Refill  . acetaminophen (TYLENOL) 500 MG tablet Take 1,000 mg by mouth every 6 (six) hours as needed for moderate pain.    Marland Kitchen lisinopril (ZESTRIL) 40 MG tablet Take 1 tablet (40 mg total) by mouth daily. 90 tablet 1  . Saw Palmetto, Serenoa repens, (SAW PALMETTO PO) Take 1 capsule by mouth daily.    . Vitamin D, Ergocalciferol, (DRISDOL) 1.25 MG (50000 UNIT) CAPS capsule Take 1 capsule (50,000 Units total) by mouth every 7 (seven) days. (Patient not taking: No sig reported) 12 capsule 0   No current facility-administered medications on file prior to visit.    No Known Allergies  Social History   Socioeconomic History  . Marital status: Married    Spouse name: Not on file  . Number of children: Not on file  . Years of education: Not on file  . Highest  education level: Not on file  Occupational History  . Not on file  Tobacco Use  . Smoking status: Never Smoker  . Smokeless tobacco: Never Used  Vaping Use  . Vaping Use: Never used  Substance and Sexual Activity  . Alcohol use: No    Alcohol/week: 0.0 standard drinks  . Drug use: No  . Sexual activity: Not Currently  Other Topics Concern  . Not on file  Social History Narrative  . Not on file   Social Determinants of Health   Financial Resource Strain: Not on file  Food Insecurity: Not on file  Transportation Needs: Not on file  Physical Activity: Not on file  Stress: Not on file  Social Connections: Not on file  Intimate Partner Violence: Not on file    Family History  Problem Relation Age of Onset  . Breast cancer Mother   . Prostate cancer Father   . Hypertension Father   . Lung cancer Son     Past Surgical History:  Procedure Laterality Date  . CHOLECYSTECTOMY    . IRRIGATION AND DEBRIDEMENT KNEE Right 12/28/2018   Procedure: IRRIGATION AND DEBRIDEMENT KNEE;  Surgeon: Mcarthur Rossetti, MD;  Location: [facility];  Service: Orthopedics;  Laterality: Right;  . KNEE SURGERY    . ROTATOR CUFF REPAIR    . THUMB ARTHROSCOPY Right 2007    ROS: Review of Systems Negative except as stated above  PHYSICAL EXAM: BP 126/80 (BP Location: Left Arm, Patient Position: Sitting)   Pulse 90  Wt 192 lb 6.4 oz (87.3 kg)   SpO2 96%   BMI 29.69 kg/m   Physical Exam Constitutional:      Appearance: He is overweight.  HENT:     Head: Normocephalic and atraumatic.  Eyes:     Extraocular Movements: Extraocular movements intact.     Pupils: Pupils are equal, round, and reactive to light.  Cardiovascular:     Rate and Rhythm: Normal rate and regular rhythm.     Pulses: Normal pulses.     Heart sounds: Normal heart sounds.  Pulmonary:     Effort: Pulmonary effort is normal.     Breath sounds: Normal breath sounds.  Musculoskeletal:     Cervical back: Normal range  of motion and neck supple.  Neurological:     General: No focal deficit present.     Mental Status: He is alert and oriented to person, place, and time.  Psychiatric:        Mood and Affect: Mood normal.        Behavior: Behavior normal.     ASSESSMENT AND PLAN: 1. Polyarthralgia: - Continue Prednisone as prescribed.  - Follow-up with primary provider as needed.  - predniSONE (DELTASONE) 10 MG tablet; Take 1 tablet (10 mg total) by mouth daily with breakfast.  Dispense: 30 tablet; Refill: 0  2. Preop cardiovascular exam: - Patient presents today with surgical clearance form for surgery of back lesion/lower left back cyst scheduled 05/02/2020. The clearance form lists the need for an EKG and stress test.  - Referral to Cardiology for further evaluation and management.  - Follow-up with primary provider as needed. - Ambulatory referral to Cardiology     Patient was given the opportunity to ask questions.  Patient verbalized understanding of the plan and was able to repeat key elements of the plan. Patient was given clear instructions to go to Emergency Department or return to medical center if symptoms don't improve, worsen, or new problems develop.The patient verbalized understanding.   Orders Placed This Encounter  Procedures  . Ambulatory referral to Cardiology     Requested Prescriptions   Signed Prescriptions Disp Refills  . predniSONE (DELTASONE) 10 MG tablet 30 tablet 0    Sig: Take 1 tablet (10 mg total) by mouth daily with breakfast.    Camillia Herter, NP

## 2020-04-29 NOTE — Progress Notes (Signed)
Needs prednisone refill

## 2020-04-30 ENCOUNTER — Other Ambulatory Visit (HOSPITAL_COMMUNITY)
Admission: RE | Admit: 2020-04-30 | Discharge: 2020-04-30 | Disposition: A | Discharge status: Home / Self Care | Payer: PRIVATE HEALTH INSURANCE | Source: Ambulatory Visit | Attending: Surgery | Admitting: Surgery

## 2020-04-30 ENCOUNTER — Other Ambulatory Visit: Payer: Self-pay

## 2020-04-30 DIAGNOSIS — Z01812 Encounter for preprocedural laboratory examination: Secondary | ICD-10-CM | POA: Insufficient documentation

## 2020-04-30 DIAGNOSIS — Z20822 Contact with and (suspected) exposure to covid-19: Secondary | ICD-10-CM | POA: Diagnosis not present

## 2020-04-30 NOTE — Pre-Procedure Instructions (Addendum)
Attempted to contact patient via phone to remind him of his scheduled covid test prior to surgery. Left message on voicemail.  Patient may have gone to another lab for test.  Patient has gone to another facility today for his preop covid test.

## 2020-04-30 NOTE — Progress Notes (Signed)
Per Dr Hampton Abbot the patient will not need to have Medical Clearance for surgery.

## 2020-05-01 LAB — SARS CORONAVIRUS 2 (TAT 6-24 HRS): SARS Coronavirus 2: NEGATIVE

## 2020-05-02 ENCOUNTER — Ambulatory Visit
Admission: RE | Admit: 2020-05-02 | Discharge: 2020-05-02 | Disposition: A | Discharge status: Home / Self Care | Payer: PRIVATE HEALTH INSURANCE | Attending: Surgery | Admitting: Surgery

## 2020-05-02 ENCOUNTER — Encounter: Payer: Self-pay | Admitting: Surgery

## 2020-05-02 ENCOUNTER — Ambulatory Visit: Payer: PRIVATE HEALTH INSURANCE | Admitting: Anesthesiology

## 2020-05-02 ENCOUNTER — Encounter
Admission: RE | Disposition: A | Discharge status: Home / Self Care | Payer: Self-pay | Source: Home / Self Care | Attending: Surgery

## 2020-05-02 ENCOUNTER — Other Ambulatory Visit: Payer: Self-pay

## 2020-05-02 DIAGNOSIS — Z8249 Family history of ischemic heart disease and other diseases of the circulatory system: Secondary | ICD-10-CM | POA: Diagnosis not present

## 2020-05-02 DIAGNOSIS — Z79899 Other long term (current) drug therapy: Secondary | ICD-10-CM | POA: Insufficient documentation

## 2020-05-02 DIAGNOSIS — L720 Epidermal cyst: Secondary | ICD-10-CM | POA: Diagnosis not present

## 2020-05-02 DIAGNOSIS — Z8042 Family history of malignant neoplasm of prostate: Secondary | ICD-10-CM | POA: Insufficient documentation

## 2020-05-02 DIAGNOSIS — Z803 Family history of malignant neoplasm of breast: Secondary | ICD-10-CM | POA: Diagnosis not present

## 2020-05-02 DIAGNOSIS — Z7952 Long term (current) use of systemic steroids: Secondary | ICD-10-CM | POA: Diagnosis not present

## 2020-05-02 DIAGNOSIS — L729 Follicular cyst of the skin and subcutaneous tissue, unspecified: Secondary | ICD-10-CM | POA: Diagnosis present

## 2020-05-02 DIAGNOSIS — Z801 Family history of malignant neoplasm of trachea, bronchus and lung: Secondary | ICD-10-CM | POA: Insufficient documentation

## 2020-05-02 HISTORY — PX: EXCISION OF BACK LESION: SHX6597

## 2020-05-02 SURGERY — EXCISION, LESION, BACK
Anesthesia: General | Site: Back | Laterality: Left

## 2020-05-02 MED ORDER — FAMOTIDINE 20 MG PO TABS
ORAL_TABLET | ORAL | Status: AC
  Filled 2020-05-02: qty 1

## 2020-05-02 MED ORDER — FENTANYL CITRATE (PF) 100 MCG/2ML IJ SOLN
INTRAMUSCULAR | Status: AC
  Filled 2020-05-02: qty 2

## 2020-05-02 MED ORDER — FENTANYL CITRATE (PF) 100 MCG/2ML IJ SOLN: 25.0000 ug | INTRAMUSCULAR | Status: DC | PRN

## 2020-05-02 MED ORDER — BUPIVACAINE LIPOSOME 1.3 % IJ SUSP
INTRAMUSCULAR | Status: AC
  Filled 2020-05-02: qty 20

## 2020-05-02 MED ORDER — SODIUM CHLORIDE FLUSH 0.9 % IV SOLN
INTRAVENOUS | Status: AC
  Filled 2020-05-02: qty 40

## 2020-05-02 MED ORDER — MIDAZOLAM HCL 2 MG/2ML IJ SOLN
INTRAMUSCULAR | Status: AC
  Filled 2020-05-02: qty 2

## 2020-05-02 MED ORDER — CHLORHEXIDINE GLUCONATE CLOTH 2 % EX PADS
6.0000 | MEDICATED_PAD | Freq: Once | CUTANEOUS | Status: AC
  Administered 2020-05-02: 6 via TOPICAL

## 2020-05-02 MED ORDER — LACTATED RINGERS IV SOLN: INTRAVENOUS | Status: DC

## 2020-05-02 MED ORDER — CHLORHEXIDINE GLUCONATE 0.12 % MT SOLN
15.0000 mL | Freq: Once | OROMUCOSAL | Status: AC
  Administered 2020-05-02: 15 mL via OROMUCOSAL

## 2020-05-02 MED ORDER — FENTANYL CITRATE (PF) 100 MCG/2ML IJ SOLN
INTRAMUSCULAR | Status: DC | PRN
  Administered 2020-05-02 (×3): 25 ug via INTRAVENOUS

## 2020-05-02 MED ORDER — ACETAMINOPHEN 500 MG PO TABS
ORAL_TABLET | ORAL | Status: AC
  Filled 2020-05-02: qty 2

## 2020-05-02 MED ORDER — MIDAZOLAM HCL 2 MG/2ML IJ SOLN
INTRAMUSCULAR | Status: DC | PRN
  Administered 2020-05-02: 2 mg via INTRAVENOUS

## 2020-05-02 MED ORDER — IBUPROFEN 600 MG PO TABS: 600.0000 mg | ORAL_TABLET | Freq: Three times a day (TID) | ORAL | 1 refills | Status: AC | PRN

## 2020-05-02 MED ORDER — LIDOCAINE HCL (CARDIAC) PF 100 MG/5ML IV SOSY
PREFILLED_SYRINGE | INTRAVENOUS | Status: DC | PRN
  Administered 2020-05-02: 100 mg via INTRAVENOUS

## 2020-05-02 MED ORDER — CHLORHEXIDINE GLUCONATE 0.12 % MT SOLN
OROMUCOSAL | Status: AC
  Filled 2020-05-02: qty 15

## 2020-05-02 MED ORDER — GABAPENTIN 100 MG PO CAPS
ORAL_CAPSULE | ORAL | Status: AC
  Filled 2020-05-02: qty 1

## 2020-05-02 MED ORDER — GABAPENTIN 100 MG PO CAPS
100.0000 mg | ORAL_CAPSULE | ORAL | Status: AC
  Administered 2020-05-02: 100 mg via ORAL

## 2020-05-02 MED ORDER — PROPOFOL 10 MG/ML IV BOLUS
INTRAVENOUS | Status: DC | PRN
  Administered 2020-05-02: 150 mg via INTRAVENOUS
  Administered 2020-05-02: 30 mg via INTRAVENOUS

## 2020-05-02 MED ORDER — ACETAMINOPHEN 160 MG/5ML PO SOLN
325.0000 mg | ORAL | Status: DC | PRN
  Filled 2020-05-02: qty 20.3

## 2020-05-02 MED ORDER — BUPIVACAINE HCL (PF) 0.5 % IJ SOLN
INTRAMUSCULAR | Status: AC
  Filled 2020-05-02: qty 30

## 2020-05-02 MED ORDER — CEFAZOLIN SODIUM-DEXTROSE 2-4 GM/100ML-% IV SOLN
INTRAVENOUS | Status: AC
  Filled 2020-05-02: qty 100

## 2020-05-02 MED ORDER — ACETAMINOPHEN 500 MG PO TABS: 1000.0000 mg | ORAL_TABLET | Freq: Four times a day (QID) | ORAL | Status: AC | PRN

## 2020-05-02 MED ORDER — CEFAZOLIN SODIUM-DEXTROSE 2-4 GM/100ML-% IV SOLN
2.0000 g | INTRAVENOUS | Status: AC
  Administered 2020-05-02: 2 g via INTRAVENOUS

## 2020-05-02 MED ORDER — PROPOFOL 10 MG/ML IV BOLUS
INTRAVENOUS | Status: AC
  Filled 2020-05-02: qty 20

## 2020-05-02 MED ORDER — PROPOFOL 500 MG/50ML IV EMUL
INTRAVENOUS | Status: AC
  Filled 2020-05-02: qty 50

## 2020-05-02 MED ORDER — BUPIVACAINE-EPINEPHRINE (PF) 0.25% -1:200000 IJ SOLN
INTRAMUSCULAR | Status: DC | PRN
  Administered 2020-05-02: 30 mL

## 2020-05-02 MED ORDER — HYDROCODONE-ACETAMINOPHEN 7.5-325 MG PO TABS: 1.0000 | ORAL_TABLET | Freq: Once | ORAL | Status: DC | PRN

## 2020-05-02 MED ORDER — ACETAMINOPHEN 500 MG PO TABS
1000.0000 mg | ORAL_TABLET | ORAL | Status: AC
  Administered 2020-05-02: 1000 mg via ORAL

## 2020-05-02 MED ORDER — OXYCODONE HCL 5 MG PO TABS: 5.0000 mg | ORAL_TABLET | Freq: Four times a day (QID) | ORAL | 0 refills | Status: DC | PRN

## 2020-05-02 MED ORDER — BUPIVACAINE-EPINEPHRINE (PF) 0.25% -1:200000 IJ SOLN
INTRAMUSCULAR | Status: AC
  Filled 2020-05-02: qty 30

## 2020-05-02 MED ORDER — ONDANSETRON HCL 4 MG/2ML IJ SOLN: 4.0000 mg | Freq: Once | INTRAMUSCULAR | Status: DC | PRN

## 2020-05-02 MED ORDER — ORAL CARE MOUTH RINSE: 15.0000 mL | Freq: Once | OROMUCOSAL | Status: AC

## 2020-05-02 MED ORDER — DEXAMETHASONE SODIUM PHOSPHATE 10 MG/ML IJ SOLN
INTRAMUSCULAR | Status: DC | PRN
  Administered 2020-05-02: 10 mg via INTRAVENOUS

## 2020-05-02 MED ORDER — EPHEDRINE SULFATE 50 MG/ML IJ SOLN
INTRAMUSCULAR | Status: DC | PRN
  Administered 2020-05-02 (×2): 10 mg via INTRAVENOUS

## 2020-05-02 MED ORDER — ONDANSETRON HCL 4 MG/2ML IJ SOLN
INTRAMUSCULAR | Status: DC | PRN
  Administered 2020-05-02: 4 mg via INTRAVENOUS

## 2020-05-02 MED ORDER — ACETAMINOPHEN 325 MG PO TABS
325.0000 mg | ORAL_TABLET | ORAL | Status: DC | PRN
Start: 2020-05-02 — End: 2020-05-02

## 2020-05-02 MED ORDER — FAMOTIDINE 20 MG PO TABS
20.0000 mg | ORAL_TABLET | Freq: Once | ORAL | Status: AC
  Administered 2020-05-02: 20 mg via ORAL

## 2020-05-02 SURGICAL SUPPLY — 34 items
ADH SKN CLS APL DERMABOND .7 (GAUZE/BANDAGES/DRESSINGS) ×1
APL PRP STRL LF DISP 70% ISPRP (MISCELLANEOUS) ×1
CHLORAPREP W/TINT 26 (MISCELLANEOUS) ×2 IMPLANT
COVER WAND RF STERILE (DRAPES) ×2 IMPLANT
DERMABOND ADVANCED (GAUZE/BANDAGES/DRESSINGS) ×1
DERMABOND ADVANCED .7 DNX12 (GAUZE/BANDAGES/DRESSINGS) ×1 IMPLANT
DRAPE 3/4 80X56 (DRAPES) ×4 IMPLANT
DRAPE LAPAROTOMY 100X77 ABD (DRAPES) ×2 IMPLANT
ELECT CAUTERY BLADE 6.4 (BLADE) ×2 IMPLANT
ELECT REM PT RETURN 9FT ADLT (ELECTROSURGICAL) ×2
ELECTRODE REM PT RTRN 9FT ADLT (ELECTROSURGICAL) ×1 IMPLANT
GLOVE SURG SYN 7.0 (GLOVE) ×2 IMPLANT
GLOVE SURG SYN 7.0 PF PI (GLOVE) ×1 IMPLANT
GLOVE SURG SYN 7.5  E (GLOVE) ×2
GLOVE SURG SYN 7.5 E (GLOVE) ×1 IMPLANT
GLOVE SURG SYN 7.5 PF PI (GLOVE) ×1 IMPLANT
GOWN STRL REUS W/ TWL LRG LVL3 (GOWN DISPOSABLE) ×2 IMPLANT
GOWN STRL REUS W/TWL LRG LVL3 (GOWN DISPOSABLE) ×4
KIT TURNOVER KIT A (KITS) ×2 IMPLANT
LABEL OR SOLS (LABEL) ×2 IMPLANT
MANIFOLD NEPTUNE II (INSTRUMENTS) ×2 IMPLANT
NEEDLE HYPO 22GX1.5 SAFETY (NEEDLE) ×2 IMPLANT
NS IRRIG 1000ML POUR BTL (IV SOLUTION) ×2 IMPLANT
PACK BASIN MINOR ARMC (MISCELLANEOUS) ×2 IMPLANT
SUT MNCRL 4-0 (SUTURE) ×2
SUT MNCRL 4-0 27XMFL (SUTURE) ×1
SUT VIC AB 0 SH 27 (SUTURE) ×4 IMPLANT
SUT VIC AB 2-0 SH 27 (SUTURE) ×2
SUT VIC AB 2-0 SH 27XBRD (SUTURE) IMPLANT
SUT VIC AB 3-0 SH 27 (SUTURE) ×4
SUT VIC AB 3-0 SH 27X BRD (SUTURE) ×2 IMPLANT
SUTURE MNCRL 4-0 27XMF (SUTURE) ×1 IMPLANT
SYR 30ML LL (SYRINGE) ×2 IMPLANT
SYR BULB IRRIG 60ML STRL (SYRINGE) ×1 IMPLANT

## 2020-05-02 NOTE — Interval H&P Note (Signed)
History and Physical Interval Note:  05/02/2020 10:21 AM  [name]  has presented today for surgery, with the diagnosis of left lower back cyst.  The various methods of treatment have been discussed with the patient and family. After consideration of risks, benefits and other options for treatment, the patient has consented to  Procedure(s): EXCISION OF BACK LESION, left lower back cyst (Left) as a surgical intervention.  The patient's history has been reviewed, patient examined, no change in status, stable for surgery.  I have reviewed the patient's chart and labs.  Questions were answered to the patient's satisfaction.     Sherol Sabas

## 2020-05-02 NOTE — Op Note (Signed)
  Procedure Date:  05/02/2020  Pre-operative Diagnosis:  Left lower back cyst  Post-operative Diagnosis:  Left lower back cyst  Procedure:  Excision of Left lower back cyst  Surgeon:  Melvyn Neth, MD  Assistant:  Grant Ruts, PA-S  Anesthesia:  LMA  Estimated Blood Loss:  5 ml  Specimens:  Left lower back cyst  Complications:  None  Indications for Procedure:  This is a 65 y.o. male with diagnosis of a symptomatic left lower back cyst.  The patient wishes to have this excised. The risks of bleeding, abscess or infection, injury to surrounding structures, and need for further procedures were all discussed with the patient and he was willing to proceed.  Description of Procedure: The patient was correctly identified in the preoperative area and brought into the operating room.  The patient was placed supine with VTE prophylaxis in place.  Appropriate time-outs were performed.  Anesthesia was induced and the patient was intubated with LMA.  Appropriate antibiotics were infused.  The patient was then placed in right lateral decubitus position.  The patient's left lower back was prepped and draped in usual sterile fashion.  An elliptical 8 cm incision was made over the cyst, and cautery was used to dissect down the dermal tissue to the cyst itself.  Skin flaps were created using cautery as well, and then the cyst was excised using cautery, intact.  It was sent off to pathology.  The cavity was then irrigated and hemostasis was assured with cautery.  Local anesthetic was infused intradermally.  The wound was then closed in multiple layers using 2-0 Vicrly, 3-0 Vicryl and 4-0 Monocryl.  The incision was cleaned and sealed with DermaBond.  The patient was then emerged from anesthesia, extubated, and brought to the recovery room for further management.    The patient tolerated the procedure well and all counts were correct at the end of the case.   Melvyn Neth, MD

## 2020-05-02 NOTE — Anesthesia Procedure Notes (Signed)
Procedure Name: LMA Insertion Date/Time: 05/02/2020 11:00 AM Performed by: Allean Found, CRNA Pre-anesthesia Checklist: Patient identified, Patient being monitored, Timeout performed, Emergency Drugs available and Suction available Patient Re-evaluated:Patient Re-evaluated prior to induction Oxygen Delivery Method: Circle system utilized Preoxygenation: Pre-oxygenation with 100% oxygen Induction Type: IV induction Ventilation: Mask ventilation without difficulty LMA: LMA inserted LMA Size: 5.0 Tube type: Oral Number of attempts: 1 Placement Confirmation: positive ETCO2 and breath sounds checked- equal and bilateral Tube secured with: Tape Dental Injury: Teeth and Oropharynx as per pre-operative assessment

## 2020-05-02 NOTE — Anesthesia Preprocedure Evaluation (Addendum)
Anesthesia Evaluation  Patient identified by MRN, date of birth, ID band Patient awake    Reviewed: Allergy & Precautions, H&P , NPO status , reviewed documented beta blocker date and time   Airway Mallampati: II  TM Distance: >3 FB Neck ROM: full    Dental  (+) Poor Dentition, Chipped, Missing Many broken, missing teeth:   Pulmonary neg sleep apnea,    Pulmonary exam normal        Cardiovascular hypertension, Normal cardiovascular exam     Neuro/Psych    GI/Hepatic neg GERD  ,  Endo/Other    Renal/GU      Musculoskeletal  (+) Arthritis ,   Abdominal   Peds  Hematology   Anesthesia Other Findings Past Medical History: No date: Erectile dysfunction 11/30/2019: Essential hypertension No date: Gout No date: Infected prepatellar bursa, right No date: Polyarthritis No date: Pre-diabetes Past Surgical History: No date: CHOLECYSTECTOMY 12/28/2018: IRRIGATION AND DEBRIDEMENT KNEE; Right     Comment:  Procedure: IRRIGATION AND DEBRIDEMENT KNEE;  Surgeon:               Mcarthur Rossetti, MD;  Location: [facility];  Service:              Orthopedics;  Laterality: Right; No date: KNEE SURGERY No date: ROTATOR CUFF REPAIR 2007: THUMB ARTHROSCOPY; Right   Reproductive/Obstetrics                            Anesthesia Physical Anesthesia Plan  ASA: II  Anesthesia Plan: General   Post-op Pain Management:    Induction: Intravenous  PONV Risk Score and Plan: Ondansetron, TIVA and Treatment may vary due to age or medical condition  Airway Management Planned: Nasal Cannula and Natural Airway  Additional Equipment:   Intra-op Plan:   Post-operative Plan:   Informed Consent: I have reviewed the patients History and Physical, chart, labs and discussed the procedure including the risks, benefits and alternatives for the proposed anesthesia with the patient or authorized representative who  has indicated his/her understanding and acceptance.     Dental Advisory Given  Plan Discussed with: CRNA  Anesthesia Plan Comments: (Poss LMA/ETT)        Anesthesia Quick Evaluation

## 2020-05-02 NOTE — Transfer of Care (Signed)
Immediate Anesthesia Transfer of Care Note  Patient: [name]  Procedure(s) Performed: EXCISION OF BACK LESION, left lower back cyst (Left Back)  Patient Location: PACU  Anesthesia Type:General  Level of Consciousness: sedated  Airway & Oxygen Therapy: Patient Spontanous Breathing and Patient connected to face mask oxygen  Post-op Assessment: Report given to RN and Post -op Vital signs reviewed and stable  Post vital signs: Reviewed and stable  Last Vitals:  Vitals Value Taken Time  BP 116/80 05/02/20 1205  Temp    Pulse 71 05/02/20 1207  Resp 17 05/02/20 1207  SpO2 100 % 05/02/20 1207  Vitals shown include unvalidated device data.  Last Pain:  Vitals:   05/02/20 0918  TempSrc: Oral  PainSc: 0-No pain         Complications: No complications documented.

## 2020-05-03 ENCOUNTER — Encounter: Payer: Self-pay | Admitting: Surgery

## 2020-05-03 LAB — SURGICAL PATHOLOGY

## 2020-05-09 ENCOUNTER — Encounter: Payer: Self-pay | Admitting: General Practice

## 2020-05-09 NOTE — Anesthesia Postprocedure Evaluation (Signed)
Anesthesia Post Note  Patient: Hashir Deleeuw  Procedure(s) Performed: EXCISION OF BACK LESION, left lower back cyst (Left Back)  Patient location during evaluation: PACU Anesthesia Type: General Level of consciousness: awake and alert Pain management: pain level controlled Vital Signs Assessment: post-procedure vital signs reviewed and stable Respiratory status: spontaneous breathing, nonlabored ventilation and respiratory function stable Cardiovascular status: blood pressure returned to baseline and stable Postop Assessment: no apparent nausea or vomiting Anesthetic complications: no   No complications documented.   Last Vitals:  Vitals:   05/02/20 1245 05/02/20 1305  BP: 130/78 127/71  Pulse: 75 65  Resp: 14 16  Temp: 36.8 C 36.6 C  SpO2: 96% 99%    Last Pain:  Vitals:   05/02/20 1305  TempSrc: Oral  PainSc: 0-No pain                 Alphonsus Sias

## 2020-05-14 ENCOUNTER — Ambulatory Visit (INDEPENDENT_AMBULATORY_CARE_PROVIDER_SITE_OTHER): Payer: PRIVATE HEALTH INSURANCE

## 2020-05-14 ENCOUNTER — Ambulatory Visit
Admission: EM | Admit: 2020-05-14 | Discharge: 2020-05-14 | Disposition: A | Discharge status: Home / Self Care | Payer: PRIVATE HEALTH INSURANCE | Attending: Emergency Medicine | Admitting: Emergency Medicine

## 2020-05-14 ENCOUNTER — Other Ambulatory Visit: Payer: Self-pay

## 2020-05-14 ENCOUNTER — Encounter: Payer: Self-pay | Admitting: Emergency Medicine

## 2020-05-14 DIAGNOSIS — M546 Pain in thoracic spine: Secondary | ICD-10-CM

## 2020-05-14 DIAGNOSIS — M542 Cervicalgia: Secondary | ICD-10-CM | POA: Diagnosis not present

## 2020-05-14 IMAGING — DX DG CERVICAL SPINE COMPLETE 4+V
5 series · 5 of 5 positions shown · non-contrast
Comparison: None.

CLINICAL DATA: Neck pain for 2 days after injury

EXAM:
CERVICAL SPINE - COMPLETE 4+ VIEW

[cervical spine ap]
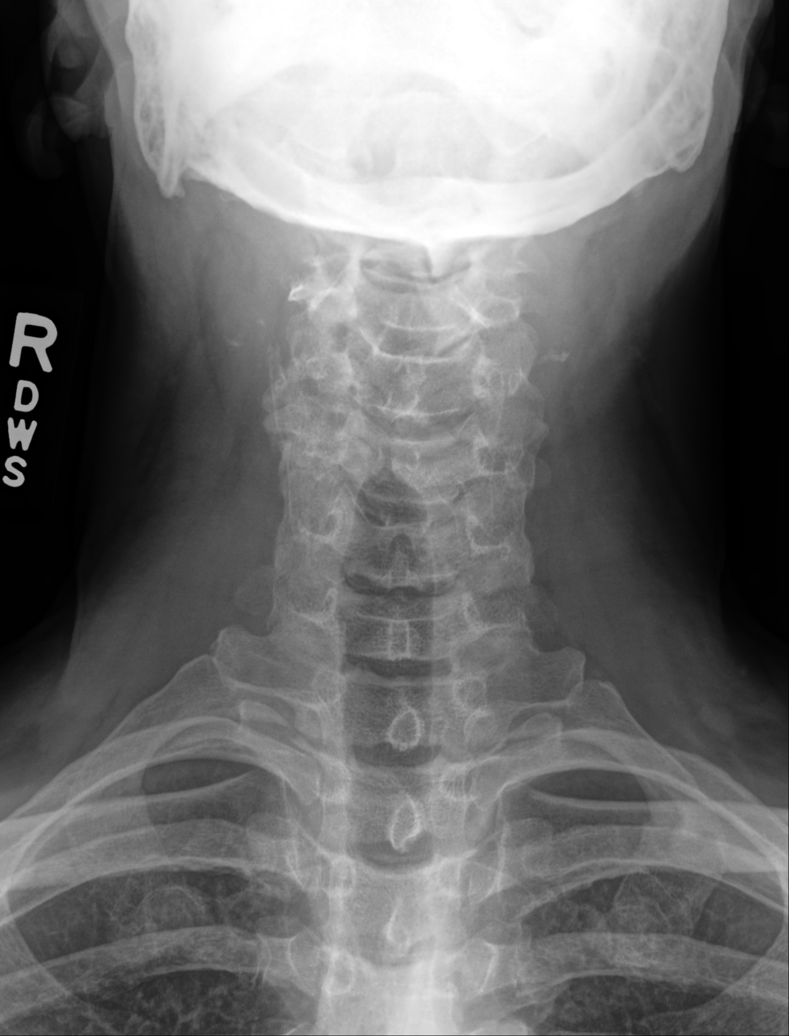

[cervical spine oblique (1 of 2)]
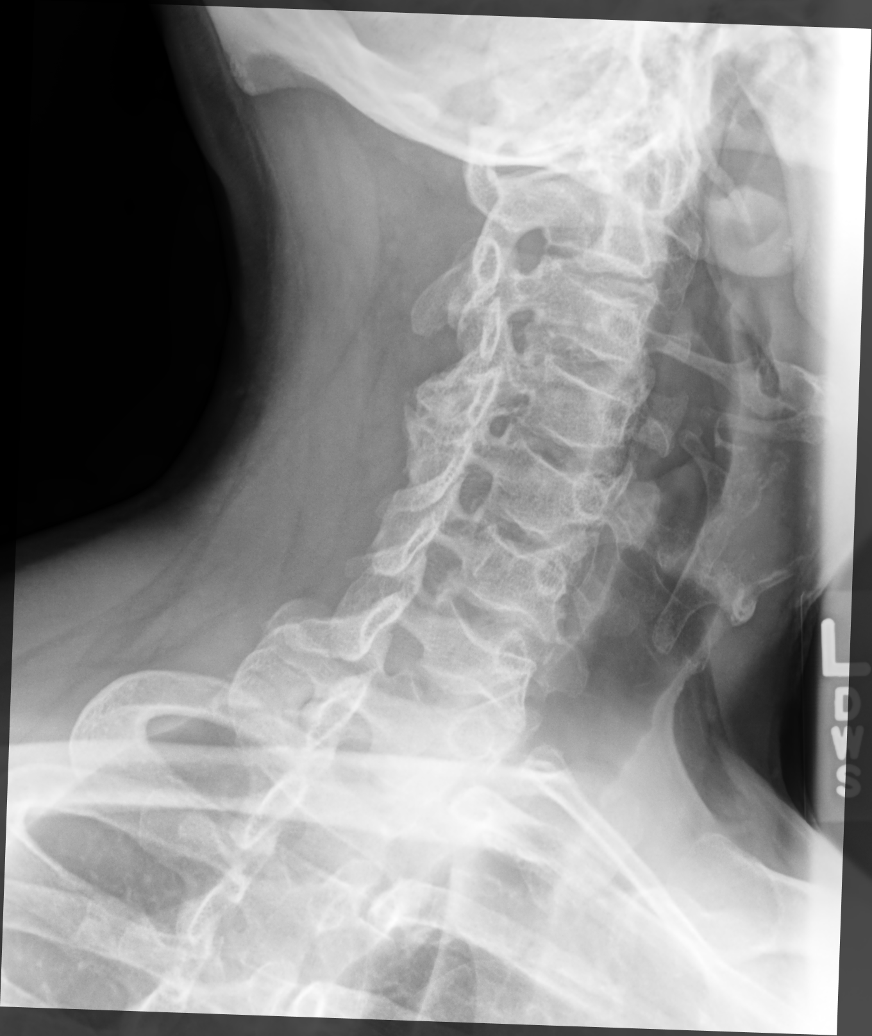

[cervical spine oblique (2 of 2)]
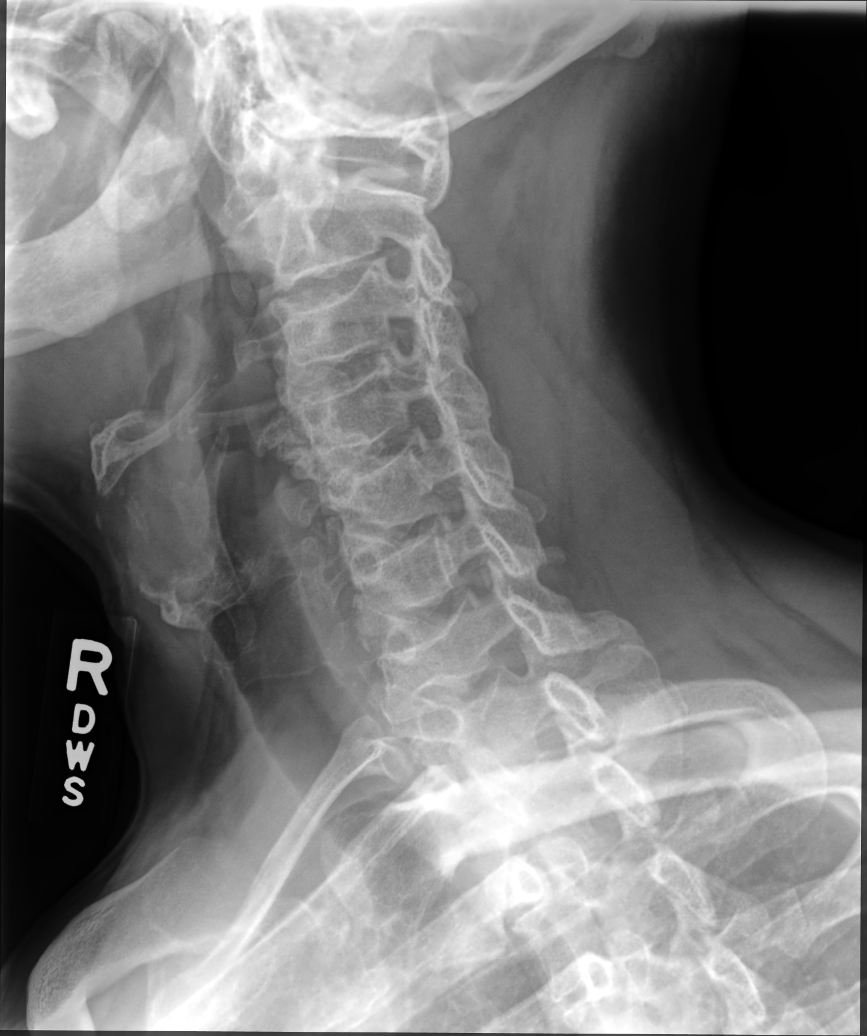

[cervical spine lat (1 of 2)]
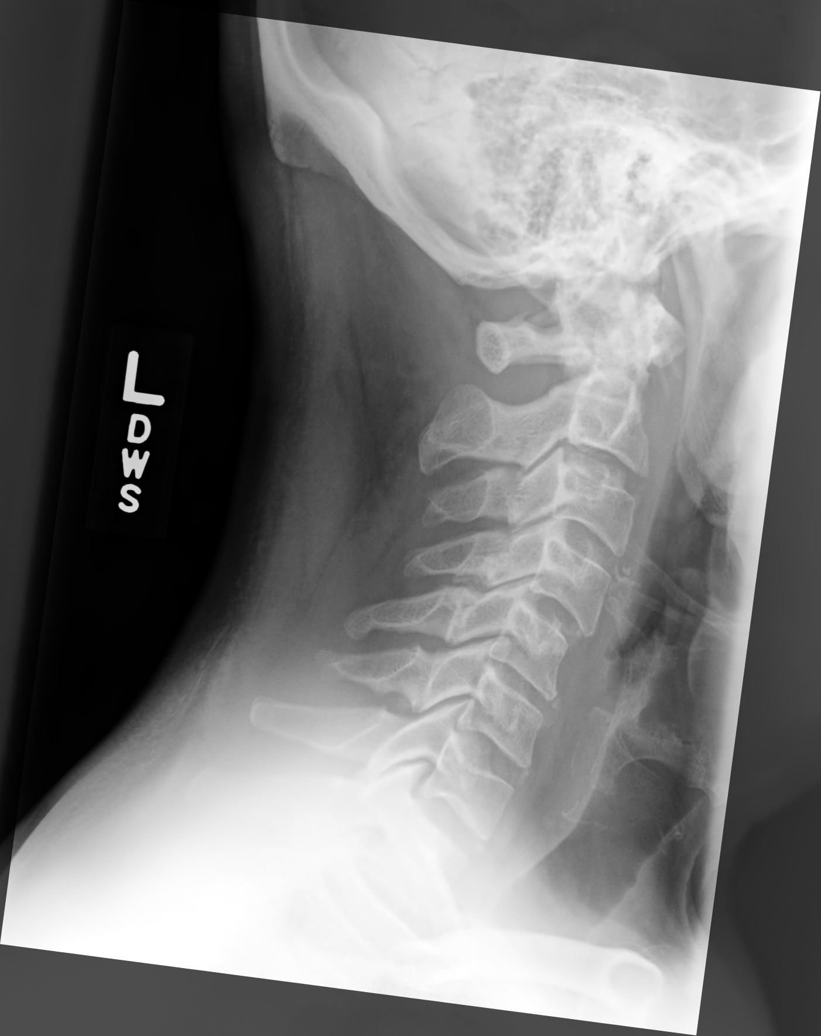

[cervical spine lat (2 of 2)]
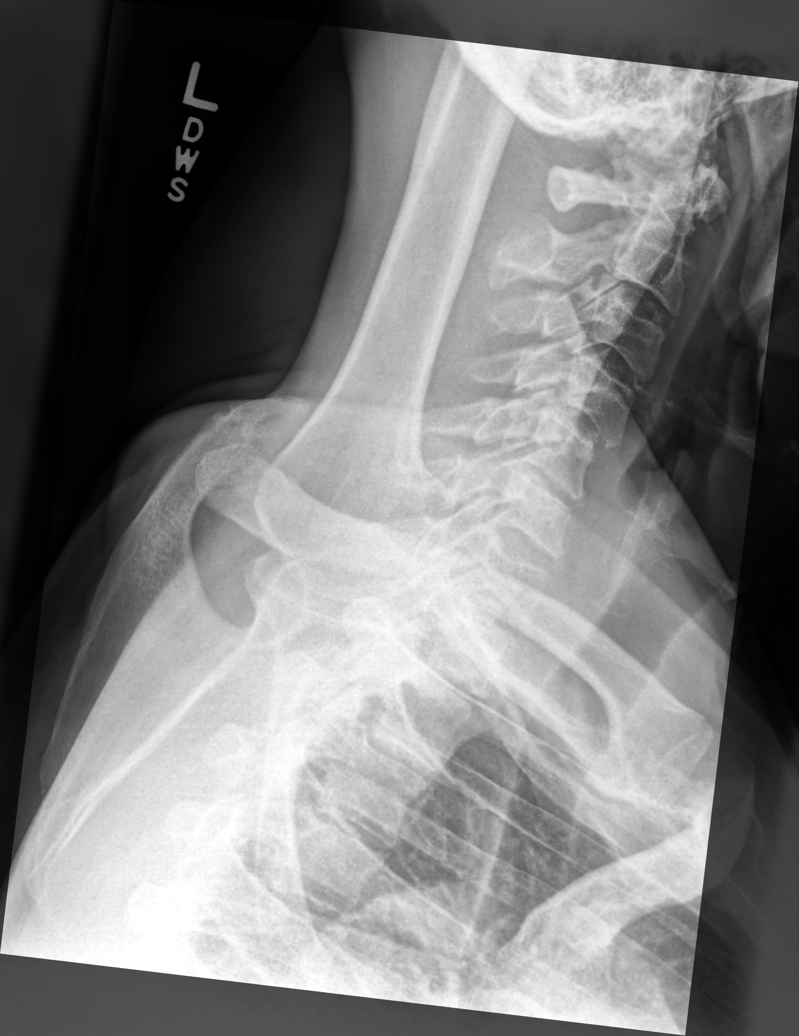

[5 of 5 positions shown; findings below may reference images not displayed]

FINDINGS: There is no evidence of cervical spine fracture or prevertebral soft
tissue swelling. Preserved cervical lordosis. Intervertebral disc
heights are preserved. Facet and uncovertebral arthropathy
contribute to bony foraminal narrowing most pronounced on the right
at C4-5 and C3-4. Bilateral carotid atherosclerosis.
IMPRESSION: 1. No acute osseous abnormality.
2. Facet and uncovertebral arthropathy contribute to bony foraminal
narrowing most pronounced on the right at C4-5 and C3-4.

## 2020-05-14 MED ORDER — PREDNISONE 10 MG PO TABS: ORAL_TABLET | ORAL | 0 refills | Status: DC

## 2020-05-14 MED ORDER — TIZANIDINE HCL 4 MG PO TABS: 2.0000 mg | ORAL_TABLET | Freq: Four times a day (QID) | ORAL | 0 refills | Status: DC | PRN

## 2020-05-14 MED ORDER — NAPROXEN 500 MG PO TABS: 500.0000 mg | ORAL_TABLET | Freq: Two times a day (BID) | ORAL | 0 refills | Status: DC

## 2020-05-14 NOTE — ED Triage Notes (Signed)
Patient c/o neck pain and back numbness x 2 days.   Patient states that onset of symptoms began after "shoveling outside".   Patient denies any falls.   Patient states pain is constant and worsened by ROM of neck.   Patient used Tylenol w/ no relief of symptoms.

## 2020-05-14 NOTE — Discharge Instructions (Addendum)
X-ray negative for any signs of fracture Please follow-up with sports medicine for further imaging if symptoms persisting Increase to prednisone 50 mg for 2 days, then 40 mg for 2 days, 30 mg for 2 days then return to daily 20 mg prednisone course May supplement tizanidine which is a muscle relaxer at home and bedtime, this may cause drowsiness Alternate ice and heat to back Follow-up if not improving

## 2020-05-14 NOTE — ED Provider Notes (Signed)
[name]    CSN: [id number] Arrival date & time: 05/14/20  1004      History   Chief Complaint Chief Complaint  Patient presents with  . Neck Pain  . Back Pain    HPI [name] is a 65 y.o. male history of hypertension, gout, presenting today for evaluation of neck pain.  Reports 2-3 days ago he was shoveling in the yard and felt a popping sensation in his lower neck/upper back.  Since he has had continued pain as well as numbness tingling centrally extending towards the right side. Denies radiation into upper extremities, denies weakness in arms.  Taking Tylenol without full relief.  On prednisone 20 mg daily for arthralgias from PCP.  HPI  Past Medical History:  Diagnosis Date  . Erectile dysfunction   . Essential hypertension 11/30/2019  . Gout   . Infected prepatellar bursa, right   . Polyarthritis   . Pre-diabetes     Patient Active Problem List   Diagnosis Date Noted  . Epidermoid cyst of skin of back   . Grief reaction 02/09/2020  . Influenza vaccine refused 01/17/2020  . Hyperlipidemia 12/12/2019  . Elevated PSA, less than 10 ng/ml 12/12/2019  . Vitamin D deficiency 12/12/2019  . Abnormal thyroid blood test 12/12/2019  . Essential hypertension 11/30/2019  . Subcutaneous nodule of back 11/30/2019  . Prepatellar abscess 12/29/2018  . Cellulitis of right knee 12/28/2018  . Infected prepatellar bursa, right   . Prediabetes 07/08/2016  . Elevated blood pressure reading without diagnosis of hypertension 06/01/2016  . Dental caries 05/21/2015  . Thrombocytosis 04/24/2015  . Erectile dysfunction 04/23/2015  . Polyarthritis 03/08/2015  . ROTATOR CUFF INJURY, LEFT SHOULDER 06/20/2009    Past Surgical History:  Procedure Laterality Date  . CHOLECYSTECTOMY    . EXCISION OF BACK LESION Left 05/02/2020   Procedure: EXCISION OF BACK LESION, left lower back cyst;  Surgeon: Olean Ree, MD;  Location: [facility];  Service: General;  Laterality:  Left;  . IRRIGATION AND DEBRIDEMENT KNEE Right 12/28/2018   Procedure: IRRIGATION AND DEBRIDEMENT KNEE;  Surgeon: Mcarthur Rossetti, MD;  Location: [facility];  Service: Orthopedics;  Laterality: Right;  . KNEE SURGERY    . ROTATOR CUFF REPAIR    . THUMB ARTHROSCOPY Right 2007       Home Medications    Prior to Admission medications   Medication Sig Start Date End Date Taking? Authorizing Provider  predniSONE (DELTASONE) 10 MG tablet Increase to 50 mg for 2 days, then 40 mg for 2 days, 30 mg for 2 days, then return to daily 20 mg as prescribed 05/14/20  Yes Dori Devino C, PA-C  tiZANidine (ZANAFLEX) 4 MG tablet Take 0.5-1 tablets (2-4 mg total) by mouth every 6 (six) hours as needed for muscle spasms. 05/14/20  Yes Letoya Stallone C, PA-C  acetaminophen (TYLENOL) 500 MG tablet Take 1,000 mg by mouth every 6 (six) hours as needed for moderate pain.    [provider]  acetaminophen (TYLENOL) 500 MG tablet Take 2 tablets (1,000 mg total) by mouth every 6 (six) hours as needed for mild pain. 05/02/20   Olean Ree, MD  ibuprofen (ADVIL) 600 MG tablet Take 1 tablet (600 mg total) by mouth every 8 (eight) hours as needed for moderate pain. 05/02/20   Olean Ree, MD  lisinopril (ZESTRIL) 40 MG tablet Take 1 tablet (40 mg total) by mouth daily. 03/14/20   Nicolette Bang, DO  oxyCODONE (OXY IR/ROXICODONE) 5 MG  immediate release tablet Take 1 tablet (5 mg total) by mouth every 6 (six) hours as needed for severe pain. 05/02/20   Olean Ree, MD  Saw Palmetto, Serenoa repens, (SAW PALMETTO PO) Take 1 capsule by mouth daily.    [provider]  Vitamin D, Ergocalciferol, (DRISDOL) 1.25 MG (50000 UNIT) CAPS capsule Take 1 capsule (50,000 Units total) by mouth every 7 (seven) days. Patient not taking: No sig reported 03/22/20   Nicolette Bang, DO    Family History Family History  Problem Relation Age of Onset  . Breast cancer Mother   . Prostate cancer  Father   . Hypertension Father   . Lung cancer Son     Social History Social History   Tobacco Use  . Smoking status: Never Smoker  . Smokeless tobacco: Never Used  Vaping Use  . Vaping Use: Never used  Substance Use Topics  . Alcohol use: No    Alcohol/week: 0.0 standard drinks  . Drug use: No     Allergies   Patient has no known allergies.   Review of Systems Review of Systems  Constitutional: Negative for fatigue and fever.  Eyes: Negative for redness, itching and visual disturbance.  Respiratory: Negative for shortness of breath.   Cardiovascular: Negative for chest pain and leg swelling.  Gastrointestinal: Negative for nausea and vomiting.  Musculoskeletal: Positive for back pain, myalgias and neck pain. Negative for arthralgias.  Skin: Negative for color change, rash and wound.  Neurological: Negative for dizziness, syncope, weakness, light-headedness and headaches.     Physical Exam Triage Vital Signs ED Triage Vitals  Enc Vitals Group     BP      Pulse      Resp      Temp      Temp src      SpO2      Weight      Height      Head Circumference      Peak Flow      Pain Score      Pain Loc      Pain Edu?      Excl. in [place]?    No data found.  Updated Vital Signs BP (!) 145/79 (BP Location: Left Arm)   Pulse 80   Temp 98.3 F (36.8 C) (Oral)   Resp 14   SpO2 96%   Visual Acuity Right Eye Distance:   Left Eye Distance:   Bilateral Distance:    Right Eye Near:   Left Eye Near:    Bilateral Near:     Physical Exam Vitals and nursing note reviewed.  Constitutional:      Appearance: He is well-developed.     Comments: No acute distress  HENT:     Head: Normocephalic and atraumatic.     Nose: Nose normal.  Eyes:     Conjunctiva/sclera: Conjunctivae normal.  Cardiovascular:     Rate and Rhythm: Normal rate.  Pulmonary:     Effort: Pulmonary effort is normal. No respiratory distress.  Abdominal:     General: There is no distension.   Musculoskeletal:        General: Normal range of motion.     Cervical back: Neck supple.     Comments: Back: Nontender to palpation of superior cervical spine, tenderness to palpation to lower cervical spine and to upper thoracic area without palpable deformity or step-off, mild tenderness to palpation extending to right trapezius area  Full active range of motion  of bilateral shoulders, strength 5/5 and equal bilaterally, grip strength 5/5 and equal bilaterally, sensation intact distally  Skin:    General: Skin is warm and dry.  Neurological:     Mental Status: He is alert and oriented to person, place, and time.      UC Treatments / Results  Labs (all labs ordered are listed, but only abnormal results are displayed) Labs Reviewed - No data to display  EKG   Radiology DG Cervical Spine Complete  Result Date: 05/14/2020 CLINICAL DATA:  Neck pain for 2 days after injury EXAM: CERVICAL SPINE - COMPLETE 4+ VIEW COMPARISON:  None. FINDINGS: There is no evidence of cervical spine fracture or prevertebral soft tissue swelling. Preserved cervical lordosis. Intervertebral disc heights are preserved. Facet and uncovertebral arthropathy contribute to bony foraminal narrowing most pronounced on the right at C4-5 and C3-4. Bilateral carotid atherosclerosis. IMPRESSION: 1. No acute osseous abnormality. 2. Facet and uncovertebral arthropathy contribute to bony foraminal narrowing most pronounced on the right at C4-5 and C3-4. Electronically Signed   By: Davina Poke D.O.   On: 05/14/2020 11:23    Procedures Procedures (including critical care time)  Medications Ordered in UC Medications - No data to display  Initial Impression / Assessment and Plan / UC Course  I have reviewed the triage vital signs and the nursing notes.  Pertinent labs & imaging results that were available during my care of the patient were reviewed by me and considered in my medical decision making (see chart for  details).     X-ray negative for acute fracture, does show some foraminal narrowing and other arthropathy within cervical spine, no signs of shoveler's fracture or spinous process fracture at this time.  Given patient is already on prednisone, will defer further NSAIDs, will increase to 50 x 2 days followed by 40 mg x 2 days, 30 mg x 2 days and then return to his daily 20 mg prednisone dose.  Supplement with tizanidine.  Encouraged to follow-up with PCP or sports medicine if symptoms persisting.   Discussed strict return precautions. Patient verbalized understanding and is agreeable with plan.  Final Clinical Impressions(s) / UC Diagnoses   Final diagnoses:  Acute midline thoracic back pain     Discharge Instructions     X-ray negative for any signs of fracture Please follow-up with sports medicine for further imaging if symptoms persisting Increase to prednisone 50 mg for 2 days, then 40 mg for 2 days, 30 mg for 2 days then return to daily 20 mg prednisone course May supplement tizanidine which is a muscle relaxer at home and bedtime, this may cause drowsiness Alternate ice and heat to back Follow-up if not improving    ED Prescriptions    Medication Sig Dispense Auth. Provider   naproxen (NAPROSYN) 500 MG tablet  (Status: Discontinued) Take 1 tablet (500 mg total) by mouth 2 (two) times daily. 30 tablet Nanette Wirsing C, PA-C   tiZANidine (ZANAFLEX) 4 MG tablet Take 0.5-1 tablets (2-4 mg total) by mouth every 6 (six) hours as needed for muscle spasms. 30 tablet Manpreet Strey C, PA-C   predniSONE (DELTASONE) 10 MG tablet Increase to 50 mg for 2 days, then 40 mg for 2 days, 30 mg for 2 days, then return to daily 20 mg as prescribed 24 tablet Brode Sculley, [place] C, PA-C     PDMP not reviewed this encounter.   Janith Lima, PA-C 05/14/20 1149

## 2020-07-08 ENCOUNTER — Ambulatory Visit
Admission: EM | Admit: 2020-07-08 | Discharge: 2020-07-08 | Disposition: A | Discharge status: Home / Self Care | Payer: PRIVATE HEALTH INSURANCE | Attending: Emergency Medicine | Admitting: Emergency Medicine

## 2020-07-08 ENCOUNTER — Other Ambulatory Visit: Payer: Self-pay

## 2020-07-08 DIAGNOSIS — J069 Acute upper respiratory infection, unspecified: Secondary | ICD-10-CM

## 2020-07-08 DIAGNOSIS — Z20822 Contact with and (suspected) exposure to covid-19: Secondary | ICD-10-CM | POA: Diagnosis not present

## 2020-07-08 MED ORDER — BENZONATATE 200 MG PO CAPS: 200.0000 mg | ORAL_CAPSULE | Freq: Three times a day (TID) | ORAL | 0 refills | Status: AC | PRN

## 2020-07-08 MED ORDER — PREDNISONE 20 MG PO TABS: 20.0000 mg | ORAL_TABLET | Freq: Every day | ORAL | 0 refills | Status: DC

## 2020-07-08 MED ORDER — LORATADINE 10 MG PO TABS: 10.0000 mg | ORAL_TABLET | Freq: Every day | ORAL | 0 refills | Status: DC

## 2020-07-08 NOTE — ED Triage Notes (Signed)
Pt c/o cough, chest congestion, headache, body aches, and fatigue since Thursday night. States has been out of his prednisone for almost 2 wks.

## 2020-07-08 NOTE — ED Provider Notes (Signed)
[name]    CSN: [id number] Arrival date & time: 07/08/20  1843      History   Chief Complaint Chief Complaint  Patient presents with  . Cough    HPI [name] is a 65 y.o. male history of hypertension, gout, presenting today for evaluation of URI symptoms.  Reports over the past 4 to 5 days he has had cough, congestion, headaches, body aches and generalized fatigue.  Denies any known fevers.  Denies known exposure to COVID or flu.  Denies GI symptoms.  He also reports increased arthralgias over the past couple weeks as he has been off prednisone.  He typically takes 20 mg daily and reports that when he does not take this he has a lot of arthralgias.  He is currently between PCPs as his current PCP recently left the practice.  Requesting refill.  Verbalizes understanding of negative side effects of long-term use of chronic steroids.  HPI  Past Medical History:  Diagnosis Date  . Erectile dysfunction   . Essential hypertension 11/30/2019  . Gout   . Infected prepatellar bursa, right   . Polyarthritis   . Pre-diabetes     Patient Active Problem List   Diagnosis Date Noted  . Epidermoid cyst of skin of back   . Grief reaction 02/09/2020  . Influenza vaccine refused 01/17/2020  . Hyperlipidemia 12/12/2019  . Elevated PSA, less than 10 ng/ml 12/12/2019  . Vitamin D deficiency 12/12/2019  . Abnormal thyroid blood test 12/12/2019  . Essential hypertension 11/30/2019  . Subcutaneous nodule of back 11/30/2019  . Prepatellar abscess 12/29/2018  . Cellulitis of right knee 12/28/2018  . Infected prepatellar bursa, right   . Prediabetes 07/08/2016  . Elevated blood pressure reading without diagnosis of hypertension 06/01/2016  . Dental caries 05/21/2015  . Thrombocytosis 04/24/2015  . Erectile dysfunction 04/23/2015  . Polyarthritis 03/08/2015  . ROTATOR CUFF INJURY, LEFT SHOULDER 06/20/2009    Past Surgical History:  Procedure Laterality Date  .  CHOLECYSTECTOMY    . EXCISION OF BACK LESION Left 05/02/2020   Procedure: EXCISION OF BACK LESION, left lower back cyst;  Surgeon: Olean Ree, MD;  Location: [facility] ORS;  Service: General;  Laterality: Left;  . IRRIGATION AND DEBRIDEMENT KNEE Right 12/28/2018   Procedure: IRRIGATION AND DEBRIDEMENT KNEE;  Surgeon: Mcarthur Rossetti, MD;  Location: [facility];  Service: Orthopedics;  Laterality: Right;  . KNEE SURGERY    . ROTATOR CUFF REPAIR    . THUMB ARTHROSCOPY Right 2007       Home Medications    Prior to Admission medications   Medication Sig Start Date End Date Taking? Authorizing Provider  benzonatate (TESSALON) 200 MG capsule Take 1 capsule (200 mg total) by mouth 3 (three) times daily as needed for up to 7 days for cough. 07/08/20 07/15/20 Yes Marya Lowden C, PA-C  loratadine (CLARITIN) 10 MG tablet Take 1 tablet (10 mg total) by mouth daily. 07/08/20  Yes Aldene Hendon C, PA-C  predniSONE (DELTASONE) 20 MG tablet Take 1 tablet (20 mg total) by mouth daily. 07/08/20  Yes Aldrick Derrig C, PA-C  acetaminophen (TYLENOL) 500 MG tablet Take 1,000 mg by mouth every 6 (six) hours as needed for moderate pain.    [provider]  acetaminophen (TYLENOL) 500 MG tablet Take 2 tablets (1,000 mg total) by mouth every 6 (six) hours as needed for mild pain. 05/02/20   Olean Ree, MD  ibuprofen (ADVIL) 600 MG tablet Take 1 tablet (600 mg  total) by mouth every 8 (eight) hours as needed for moderate pain. 05/02/20   Olean Ree, MD  lisinopril (ZESTRIL) 40 MG tablet Take 1 tablet (40 mg total) by mouth daily. 03/14/20   Nicolette Bang, DO  Saw Palmetto, Serenoa repens, (SAW PALMETTO PO) Take 1 capsule by mouth daily.    [provider]  tiZANidine (ZANAFLEX) 4 MG tablet Take 0.5-1 tablets (2-4 mg total) by mouth every 6 (six) hours as needed for muscle spasms. 05/14/20   Ladene Allocca, Elesa Hacker, PA-C    Family History Family History  Problem Relation Age of Onset  .  Breast cancer Mother   . Prostate cancer Father   . Hypertension Father   . Lung cancer Son     Social History Social History   Tobacco Use  . Smoking status: Never Smoker  . Smokeless tobacco: Never Used  Vaping Use  . Vaping Use: Never used  Substance Use Topics  . Alcohol use: No    Alcohol/week: 0.0 standard drinks  . Drug use: No     Allergies   Patient has no known allergies.   Review of Systems Review of Systems  Constitutional: Positive for fatigue. Negative for activity change, appetite change, chills and fever.  HENT: Positive for congestion, rhinorrhea, sinus pressure and sore throat. Negative for ear pain and trouble swallowing.   Eyes: Negative for discharge and redness.  Respiratory: Positive for cough. Negative for chest tightness and shortness of breath.   Cardiovascular: Negative for chest pain.  Gastrointestinal: Negative for abdominal pain, diarrhea, nausea and vomiting.  Musculoskeletal: Positive for arthralgias. Negative for myalgias.  Skin: Negative for rash.  Neurological: Positive for headaches. Negative for dizziness and light-headedness.     Physical Exam Triage Vital Signs ED Triage Vitals  Enc Vitals Group     BP      Pulse      Resp      Temp      Temp src      SpO2      Weight      Height      Head Circumference      Peak Flow      Pain Score      Pain Loc      Pain Edu?      Excl. in [place]?    No data found.  Updated Vital Signs BP (!) 152/109 (BP Location: Left Arm)   Pulse 81   Temp 99 F (37.2 C) (Oral)   Resp 18   SpO2 98%   Visual Acuity Right Eye Distance:   Left Eye Distance:   Bilateral Distance:    Right Eye Near:   Left Eye Near:    Bilateral Near:     Physical Exam Vitals and nursing note reviewed.  Constitutional:      Appearance: He is well-developed.     Comments: No acute distress  HENT:     Head: Normocephalic and atraumatic.     Ears:     Comments: Bilateral ears without tenderness to  palpation of external auricle, tragus and mastoid, EAC's without erythema or swelling, TM's with good bony landmarks and cone of light. Non erythematous.     Nose: Nose normal.     Mouth/Throat:     Comments: Oral mucosa pink and moist, no tonsillar enlargement or exudate. Posterior pharynx patent and nonerythematous, no uvula deviation or swelling. Normal phonation. Eyes:     Conjunctiva/sclera: Conjunctivae normal.  Cardiovascular:  Rate and Rhythm: Normal rate and regular rhythm.  Pulmonary:     Effort: Pulmonary effort is normal. No respiratory distress.     Comments: Breathing comfortably at rest, CTABL, no wheezing, rales or other adventitious sounds auscultated Abdominal:     General: There is no distension.  Musculoskeletal:        General: Normal range of motion.     Cervical back: Neck supple.  Skin:    General: Skin is warm and dry.  Neurological:     Mental Status: He is alert and oriented to person, place, and time.      UC Treatments / Results  Labs (all labs ordered are listed, but only abnormal results are displayed) Labs Reviewed  NOVEL CORONAVIRUS, NAA    EKG   Radiology No results found.  Procedures Procedures (including critical care time)  Medications Ordered in UC Medications - No data to display  Initial Impression / Assessment and Plan / UC Course  I have reviewed the triage vital signs and the nursing notes.  Pertinent labs & imaging results that were available during my care of the patient were reviewed by me and considered in my medical decision making (see chart for details).     1.  Viral URI-COVID test pending, recommending symptomatic and supportive care, exam reassuring, lungs clear to auscultation, no other sign of infection.  Continue to monitor  2.  Chronic arthralgias-did refill patient's prednisone, discussed risks associated with this and encourage patient to follow-up with PCP for further refills of this  Discussed  strict return precautions. Patient verbalized understanding and is agreeable with plan.  Final Clinical Impressions(s) / UC Diagnoses   Final diagnoses:  Encounter for screening laboratory testing for COVID-19 virus  Viral URI with cough     Discharge Instructions     COVID test pending, monitor MyChart for results Rest and fluids Tylenol as needed for any fevers, headaches, body aches Tessalon every 8 hours for cough or may use over-the-counter Robitussin, Delsym Daily Claritin help with congestion and postnasal drainage I have refilled your daily prednisone, follow-up with primary care for further refills of this Follow-up if any symptoms not improving or worsening    ED Prescriptions    Medication Sig Dispense Auth. Provider   predniSONE (DELTASONE) 20 MG tablet Take 1 tablet (20 mg total) by mouth daily. 30 tablet Caine Barfield C, PA-C   benzonatate (TESSALON) 200 MG capsule Take 1 capsule (200 mg total) by mouth 3 (three) times daily as needed for up to 7 days for cough. 28 capsule Uma Jerde C, PA-C   loratadine (CLARITIN) 10 MG tablet Take 1 tablet (10 mg total) by mouth daily. 15 tablet Shatiqua Heroux, Sulphur C, PA-C     PDMP not reviewed this encounter.   Janith Lima, [place] 07/09/20 [phone]

## 2020-07-08 NOTE — Discharge Instructions (Signed)
COVID test pending, monitor MyChart for results Rest and fluids Tylenol as needed for any fevers, headaches, body aches Tessalon every 8 hours for cough or may use over-the-counter Robitussin, Delsym Daily Claritin help with congestion and postnasal drainage I have refilled your daily prednisone, follow-up with primary care for further refills of this Follow-up if any symptoms not improving or worsening

## 2020-07-10 LAB — NOVEL CORONAVIRUS, NAA: SARS-CoV-2, NAA: DETECTED — AB

## 2020-07-10 LAB — SARS-COV-2, NAA 2 DAY TAT

## 2020-07-11 ENCOUNTER — Telehealth: Payer: Self-pay

## 2020-07-11 ENCOUNTER — Telehealth: Payer: Self-pay | Admitting: Unknown Physician Specialty

## 2020-07-11 NOTE — Telephone Encounter (Signed)
Called to discuss with patient about COVID-19 symptoms and the use of one of the available treatments for those with mild to moderate Covid symptoms and at a high risk of hospitalization.  Pt appears to qualify for outpatient treatment due to co-morbid conditions and/or a member of an at-risk group in accordance with the FDA Emergency Use Authorization.    Symptom onset: 07/03/20 Cough,sore throat,runny nose,body aches Vaccinated: Yes Booster? Unknown Immunocompromised? No Qualifiers: HTN NIH Criteria: Tier 1  Unable to reach pt - Left message and call back number [phone].   Marcello Moores

## 2020-07-11 NOTE — Telephone Encounter (Signed)
Pt. Called back. Would like to speak with APP. Also had his booster.

## 2020-07-11 NOTE — Telephone Encounter (Signed)
Called to discuss with patient about COVID-19 symptoms and the use of one of the available treatments for those with mild to moderate Covid symptoms and at a high risk of hospitalization.  Pt appears to qualify for outpatient treatment due to co-morbid conditions and/or a member of an at-risk group in accordance with the FDA Emergency Use Authorization.    Symptom onset: 5/5 Vaccinated: yes Booster? yes NIH Criteria: 1  Outside of window for effective tx (5 days for oral and 7 days for IV)  Kathrine Haddock

## 2020-08-26 ENCOUNTER — Other Ambulatory Visit: Payer: Self-pay

## 2020-08-26 ENCOUNTER — Ambulatory Visit: Payer: PRIVATE HEALTH INSURANCE | Admitting: Physician Assistant

## 2020-08-26 VITALS — BP 142/76 | HR 76 | Temp 98.2°F | Resp 18 | Ht 68.0 in | Wt 200.0 lb

## 2020-08-26 DIAGNOSIS — E559 Vitamin D deficiency, unspecified: Secondary | ICD-10-CM | POA: Diagnosis not present

## 2020-08-26 DIAGNOSIS — E785 Hyperlipidemia, unspecified: Secondary | ICD-10-CM

## 2020-08-26 DIAGNOSIS — F331 Major depressive disorder, recurrent, moderate: Secondary | ICD-10-CM

## 2020-08-26 DIAGNOSIS — I10 Essential (primary) hypertension: Secondary | ICD-10-CM | POA: Diagnosis not present

## 2020-08-26 DIAGNOSIS — Z1211 Encounter for screening for malignant neoplasm of colon: Secondary | ICD-10-CM

## 2020-08-26 DIAGNOSIS — I2510 Atherosclerotic heart disease of native coronary artery without angina pectoris: Secondary | ICD-10-CM

## 2020-08-26 DIAGNOSIS — M130 Polyarthritis, unspecified: Secondary | ICD-10-CM

## 2020-08-26 HISTORY — DX: Major depressive disorder, recurrent, moderate: F33.1

## 2020-08-26 MED ORDER — VITAMIN D (ERGOCALCIFEROL) 1.25 MG (50000 UNIT) PO CAPS: 50000.0000 [IU] | ORAL_CAPSULE | ORAL | 2 refills | Status: DC

## 2020-08-26 MED ORDER — PREDNISONE 20 MG PO TABS: 20.0000 mg | ORAL_TABLET | Freq: Every day | ORAL | 0 refills | Status: DC

## 2020-08-26 MED ORDER — ATORVASTATIN CALCIUM 10 MG PO TABS: 10.0000 mg | ORAL_TABLET | Freq: Every day | ORAL | 3 refills | Status: DC

## 2020-08-26 MED ORDER — DULOXETINE HCL 30 MG PO CPEP: 30.0000 mg | ORAL_CAPSULE | Freq: Every day | ORAL | 1 refills | Status: DC

## 2020-08-26 NOTE — Progress Notes (Signed)
Patient verified DOB Patient has not taken any other medication. Patient is only requesting prednisone. Patient has not picked up BP refill in a week.

## 2020-08-26 NOTE — Patient Instructions (Signed)
I sent over your last course of prednisone.  I strongly encourage you to also start the Cymbalta at the same time so that when you are done with your course of prednisone the Cymbalta should start offering relief.  If the Cymbalta makes you sleepy, you can take it at bedtime.  You will need to take vitamin D 50,000 units once a week for the next 12 weeks.  I sent this to the pharmacy as well.  I sent atorvastatin to your pharmacy, it is very important that you take this on a daily basis to help reduce your risk of a cardiovascular event.  You should have your cholesterol rechecked in approximately 3 to 6 months.  I started a referral for you to be seen by cardiology for a stress test.  And I started a referral for you to be seen by gastroenterology for colonoscopy.  Make sure you return their calls promptly so they do not close the referrals.  Please return to the mobile unit in 6 weeks for follow-up.  Kennieth Rad, PA-C Physician Assistant [facility] [email or web address]  Vitamin D Deficiency Vitamin D deficiency is when your body does not have enough vitamin D. Vitamin D is important to your body for many reasons: It helps the body absorb two important minerals--calcium and phosphorus. It plays a role in bone health. It may help to prevent some diseases, such as diabetes and multiple sclerosis. It plays a role in muscle function, including heart function. If vitamin D deficiency is severe, it can cause a condition in which your bones become soft. In adults, this condition is called osteomalacia. In children,this condition is called rickets. What are the causes? This condition may be caused by: Not eating enough foods that contain vitamin D. Not getting enough natural sun exposure. Having certain digestive system diseases that make it difficult for your body to absorb vitamin D. These diseases include Crohn's disease, chronic  pancreatitis, and cystic fibrosis. Having a surgery in which a part of the stomach or a part of the small intestine is removed. Having chronic kidney disease or liver disease. What increases the risk? You are more likely to develop this condition if you: Are older. Do not spend much time outdoors. Live in a long-term care facility. Have had broken bones. Have weak or thin bones (osteoporosis). Have a disease or condition that changes how the body absorbs vitamin D. Have dark skin. Take certain medicines, such as steroid medicines or certain seizure medicines. Are overweight or obese. What are the signs or symptoms? In mild cases of vitamin D deficiency, there may not be any symptoms. If the condition is severe, symptoms may include: Bone pain. Muscle pain. Falling often. Broken bones caused by a minor injury. How is this diagnosed? This condition may be diagnosed with blood tests. Imaging tests such as Levander Campion also be done to look for changes in the bone. How is this treated? Treatment for this condition may depend on what caused the condition. Treatment options include: Taking vitamin D supplements. Your health care provider will suggest what dose is best for you. Taking a calcium supplement. Your health care provider will suggest what dose is best for you. Follow these instructions at home: Eating and drinking  Eat foods that contain vitamin D. Choices include: Fortified dairy products, cereals, or juices. Fortified means that vitamin D has been added to the food. Check the label on the package to see if the food is fortified. Fatty fish, such  as salmon or trout. Eggs. Oysters. Mushrooms. The items listed above may not be a complete list of recommended foods and beverages. Contact a dietitian for more information. General instructions Take medicines and supplements only as told by your health care provider. Get regular, safe exposure to natural sunlight. Do not use a  tanning bed. Maintain a healthy weight. Lose weight if needed. Keep all follow-up visits as told by your health care provider. This is important. How is this prevented? You can get vitamin D by: Eating foods that naturally contain vitamin D. Eating or drinking products that have been fortified with vitamin D, such as cereals, juices, and dairy products (including milk). Taking a vitamin D supplement or a multivitamin supplement that contains vitamin D. Being in the sun. Your body naturally makes vitamin D when your skin is exposed to sunlight. Your body changes the sunlight into a form of the vitamin that it can use. Contact a health care provider if: Your symptoms do not go away. You feel nauseous or you vomit. You have fewer bowel movements than usual or are constipated. Summary Vitamin D deficiency is when your body does not have enough vitamin D. Vitamin D is important to your body for good bone health and muscle function, and it may help prevent some diseases. Vitamin D deficiency is primarily treated through supplementation. Your health care provider will suggest what dose is best for you. You can get vitamin D by eating foods that contain vitamin D, by being in the sun, and by taking a vitamin D supplement or a multivitamin supplement that contains vitamin D. This information is not intended to replace advice given to you by your health care provider. Make sure you discuss any questions you have with your healthcare provider. Document Revised: 10/25/2017 Document Reviewed: 10/25/2017 Elsevier Patient Education  [place].

## 2020-08-26 NOTE — Progress Notes (Signed)
Established Patient Office Visit  Subjective:  Patient ID: [name], male    DOB: [birth year]  Age: 65 y.o. MRN: [id number]  CC:  Chief Complaint  Patient presents with   Shoulder Pain    Left    HPI [name] reports that he was seen at urgent care on Jul 08, 2020 for chronic arthralgias.   Initial Impression / Assessment and Plan / UC Course  I have reviewed the triage vital signs and the nursing notes.   Pertinent labs & imaging results that were available during my care of the patient were reviewed by me and considered in my medical decision making (see chart for details).     1.  Viral URI-COVID test pending, recommending symptomatic and supportive care, exam reassuring, lungs clear to auscultation, no other sign of infection.  Continue to monitor   2.  Chronic arthralgias-did refill patient's prednisone, discussed risks associated with this and encourage patient to follow-up with PCP for further refills of this   Discussed strict return precautions. Patient verbalized understanding and is agreeable with plan.  States today that he has not been able to see his primary care provider, states that she has left her practice.  Reports that he does not have relief from his allover body aches when he is taking prednisone 20 mg once daily.  Reports that he completed the prescription from urgent care, states that his pain did return a few days after running out of the medication.  Reports that he did not start the Cymbalta as discussed during our last office visit.  Reports that he was told it would make him sleepy and he was afraid to try it.  States that he never did fill the prescription.  Reports that he stopped taking the Lipitor on his own, was not having any adverse effects from it, did not think that his cholesterol was elevated.  States that he did not start the vitamin D weekly treatments as prescribed by his primary care provider in January.   Past Medical  History:  Diagnosis Date   Erectile dysfunction    Essential hypertension 11/30/2019   Gout    Infected prepatellar bursa, right    Moderate episode of recurrent major depressive disorder ([place]) 08/26/2020   Polyarthritis    Pre-diabetes     Past Surgical History:  Procedure Laterality Date   CHOLECYSTECTOMY     EXCISION OF BACK LESION Left 05/02/2020   Procedure: EXCISION OF BACK LESION, left lower back cyst;  Surgeon: Olean Ree, MD;  Location: [facility];  Service: General;  Laterality: Left;   IRRIGATION AND DEBRIDEMENT KNEE Right 12/28/2018   Procedure: IRRIGATION AND DEBRIDEMENT KNEE;  Surgeon: Mcarthur Rossetti, MD;  Location: [facility];  Service: Orthopedics;  Laterality: Right;   KNEE SURGERY     ROTATOR CUFF REPAIR     THUMB ARTHROSCOPY Right 2007    Family History  Problem Relation Age of Onset   Breast cancer Mother    Prostate cancer Father    Hypertension Father    Lung cancer Son     Social History   Socioeconomic History   Marital status: Married    Spouse name: Not on file   Number of children: Not on file   Years of education: Not on file   Highest education level: Not on file  Occupational History   Not on file  Tobacco Use   Smoking status: Never   Smokeless tobacco: Never  Vaping Use  Vaping Use: Never used  Substance and Sexual Activity   Alcohol use: No    Alcohol/week: 0.0 standard drinks   Drug use: No   Sexual activity: Not Currently  Other Topics Concern   Not on file  Social History Narrative   Not on file   Social Determinants of Health   Financial Resource Strain: Not on file  Food Insecurity: Not on file  Transportation Needs: Not on file  Physical Activity: Not on file  Stress: Not on file  Social Connections: Not on file  Intimate Partner Violence: Not on file    Outpatient Medications Prior to Visit  Medication Sig Dispense Refill   acetaminophen (TYLENOL) 500 MG tablet Take 1,000 mg by mouth every 6 (six) hours as  needed for moderate pain.     acetaminophen (TYLENOL) 500 MG tablet Take 2 tablets (1,000 mg total) by mouth every 6 (six) hours as needed for mild pain.     lisinopril (ZESTRIL) 40 MG tablet Take 1 tablet (40 mg total) by mouth daily. 90 tablet 1   loratadine (CLARITIN) 10 MG tablet Take 1 tablet (10 mg total) by mouth daily. 15 tablet 0   Saw Palmetto, Serenoa repens, (SAW PALMETTO PO) Take 1 capsule by mouth daily.     ibuprofen (ADVIL) 600 MG tablet Take 1 tablet (600 mg total) by mouth every 8 (eight) hours as needed for moderate pain. (Patient not taking: Reported on 08/26/2020) 60 tablet 1   tiZANidine (ZANAFLEX) 4 MG tablet Take 0.5-1 tablets (2-4 mg total) by mouth every 6 (six) hours as needed for muscle spasms. (Patient not taking: Reported on 08/26/2020) 30 tablet 0   predniSONE (DELTASONE) 20 MG tablet Take 1 tablet (20 mg total) by mouth daily. 30 tablet 0   No facility-administered medications prior to visit.    No Known Allergies  ROS Review of Systems  Constitutional: Negative.   HENT: Negative.    Eyes: Negative.   Respiratory:  Negative for shortness of breath.   Cardiovascular:  Negative for chest pain.  Gastrointestinal: Negative.   Endocrine: Negative.   Genitourinary: Negative.   Musculoskeletal:  Positive for arthralgias.  Skin: Negative.   Allergic/Immunologic: Negative.   Neurological: Negative.   Hematological: Negative.   Psychiatric/Behavioral: Negative.       Objective:    Physical Exam Vitals and nursing note reviewed.  Constitutional:      General: He is not in acute distress.    Appearance: Normal appearance. He is not ill-appearing.  HENT:     Head: Normocephalic and atraumatic.     Right Ear: External ear normal.     Left Ear: External ear normal.     Nose: Nose normal.     Mouth/Throat:     Mouth: Mucous membranes are moist.     Pharynx: Oropharynx is clear.  Eyes:     Conjunctiva/sclera: Conjunctivae normal.     Pupils: Pupils are  equal, round, and reactive to light.  Cardiovascular:     Rate and Rhythm: Normal rate and regular rhythm.     Pulses: Normal pulses.     Heart sounds: Normal heart sounds.  Pulmonary:     Effort: Pulmonary effort is normal.     Breath sounds: Normal breath sounds.  Musculoskeletal:     Right shoulder: Crepitus present. No swelling. Decreased range of motion.     Left shoulder: Crepitus present. No swelling. Decreased range of motion.     Cervical back: Normal, normal range of motion  and neck supple.     Thoracic back: Decreased range of motion.     Lumbar back: Decreased range of motion.     Right hip: No crepitus. Normal range of motion.     Left hip: No crepitus. Normal range of motion.     Right knee: Crepitus present. No swelling.     Left knee: Crepitus present. No swelling.  Skin:    General: Skin is warm and dry.  Neurological:     General: No focal deficit present.     Mental Status: He is alert and oriented to person, place, and time.  Psychiatric:        Mood and Affect: Mood normal.        Behavior: Behavior normal.        Thought Content: Thought content normal.        Judgment: Judgment normal.    BP (!) 142/76 (BP Location: Left Arm, Patient Position: Sitting, Cuff Size: Normal)   Pulse 76   Temp 98.2 F (36.8 C) (Oral)   Resp 18   Ht 5\' 8"  (1.727 m)   Wt 200 lb (90.7 kg)   SpO2 100%   BMI 30.41 kg/m  Wt Readings from Last 3 Encounters:  08/26/20 200 lb (90.7 kg)  04/29/20 192 lb 6.4 oz (87.3 kg)  04/26/20 196 lb (88.9 kg)     Health Maintenance Due  Topic Date Due   COLON CANCER SCREENING ANNUAL FOBT  Never done   Zoster Vaccines- Shingrix (1 of 2) Never done   COVID-19 Vaccine (3 - Booster for Pfizer series) 11/07/2019    There are no preventive care reminders to display for this patient.  Lab Results  Component Value Date   TSH 0.779 12/12/2019   Lab Results  Component Value Date   WBC 8.9 03/14/2020   HGB 13.6 03/14/2020   HCT 39.8  03/14/2020   MCV 89 03/14/2020   PLT 372 03/14/2020   Lab Results  Component Value Date   NA 137 03/14/2020   K 4.4 03/14/2020   CO2 19 (L) 03/14/2020   GLUCOSE 131 (H) 03/14/2020   BUN 8 03/14/2020   CREATININE 0.92 03/14/2020   BILITOT 0.7 11/30/2019   ALKPHOS 119 11/30/2019   AST 16 11/30/2019   ALT 50 (H) 01/01/2019   PROT 7.5 11/30/2019   ALBUMIN 4.5 11/30/2019   CALCIUM 9.5 03/14/2020   ANIONGAP 10 12/22/2019   Lab Results  Component Value Date   CHOL 193 11/30/2019   Lab Results  Component Value Date   HDL 59 11/30/2019   Lab Results  Component Value Date   LDLCALC 117 (H) 11/30/2019   Lab Results  Component Value Date   TRIG 93 11/30/2019   Lab Results  Component Value Date   CHOLHDL 3.3 11/30/2019   Lab Results  Component Value Date   HGBA1C 6.8 (H) 01/17/2020      Assessment & Plan:   Problem List Items Addressed This Visit       Cardiovascular and Mediastinum   Essential hypertension   Relevant Medications   atorvastatin (LIPITOR) 10 MG tablet   CVD (cardiovascular disease)   Relevant Medications   atorvastatin (LIPITOR) 10 MG tablet   Other Relevant Orders   Ambulatory referral to Cardiology     Musculoskeletal and Integument   Polyarthritis - Primary   Relevant Medications   predniSONE (DELTASONE) 20 MG tablet     Other   Hyperlipidemia   Relevant Medications  atorvastatin (LIPITOR) 10 MG tablet   Vitamin D deficiency   Relevant Medications   Vitamin D, Ergocalciferol, (DRISDOL) 1.25 MG (50000 UNIT) CAPS capsule   Moderate episode of recurrent major depressive disorder (HCC)   Relevant Medications   DULoxetine (CYMBALTA) 30 MG capsule   Other Visit Diagnoses     Screen for colon cancer       Relevant Orders   Ambulatory referral to Gastroenterology       Meds ordered this encounter  Medications   Vitamin D, Ergocalciferol, (DRISDOL) 1.25 MG (50000 UNIT) CAPS capsule    Sig: Take 1 capsule (50,000 Units total) by  mouth every 7 (seven) days.    Dispense:  4 capsule    Refill:  2    Order Specific Question:   Supervising Provider    Answer:   Asencion Noble E [[id number]]   predniSONE (DELTASONE) 20 MG tablet    Sig: Take 1 tablet (20 mg total) by mouth daily.    Dispense:  30 tablet    Refill:  0    Order Specific Question:   Supervising Provider    Answer:   Elsie Stain [[id number]]   DULoxetine (CYMBALTA) 30 MG capsule    Sig: Take 1 capsule (30 mg total) by mouth daily.    Dispense:  30 capsule    Refill:  1    Order Specific Question:   Supervising Provider    Answer:   Asencion Noble E [[id number]]   atorvastatin (LIPITOR) 10 MG tablet    Sig: Take 1 tablet (10 mg total) by mouth daily.    Dispense:  90 tablet    Refill:  3    Order Specific Question:   Supervising Provider    Answer:   Joya Gaskins, PATRICK E [[id number]]  1. Polyarthritis Patient advised that he needs to find other ways to manage his arthritis without prednisone.  Patient does agree to do trial of Cymbalta.  Patient also agrees to take vitamin D as prescribed previously.  Patient to return to mobile unit in 6 weeks for follow-up on trial of Cymbalta. - predniSONE (DELTASONE) 20 MG tablet; Take 1 tablet (20 mg total) by mouth daily.  Dispense: 30 tablet; Refill: 0  2. Vitamin D deficiency  - Vitamin D, Ergocalciferol, (DRISDOL) 1.25 MG (50000 UNIT) CAPS capsule; Take 1 capsule (50,000 Units total) by mouth every 7 (seven) days.  Dispense: 4 capsule; Refill: 2  3. Essential hypertension   4. Moderate episode of recurrent major depressive disorder ([place]) Patient agreeable to trial - DULoxetine (CYMBALTA) 30 MG capsule; Take 1 capsule (30 mg total) by mouth daily.  Dispense: 30 capsule; Refill: 1  5. Hyperlipidemia, unspecified hyperlipidemia type Patient agreeable to resume, reviewed his cardiovascular risk score with him - atorvastatin (LIPITOR) 10 MG tablet; Take 1 tablet (10 mg total) by mouth daily.  Dispense: 90 tablet; Refill:  3  6. Screen for colon cancer  - Ambulatory referral to Gastroenterology  7. CVD (cardiovascular disease) Patient requests referral to cardiology for stress test evaluation due to significant family history - Ambulatory referral to Cardiology   I have reviewed the patient's medical history (PMH, PSH, Social History, Family History, Medications, and allergies) , and have been updated if relevant. I spent 39 minutes reviewing chart and  face to face time with patient.   Follow-up: Return in about 6 weeks (around 10/07/2020).    Loraine Grip Mayers, PA-C

## 2020-08-27 DIAGNOSIS — I2510 Atherosclerotic heart disease of native coronary artery without angina pectoris: Secondary | ICD-10-CM | POA: Insufficient documentation

## 2020-09-28 ENCOUNTER — Encounter (HOSPITAL_COMMUNITY): Payer: Self-pay

## 2020-09-28 ENCOUNTER — Ambulatory Visit (HOSPITAL_COMMUNITY)
Admission: EM | Admit: 2020-09-28 | Discharge: 2020-09-28 | Disposition: A | Discharge status: Home / Self Care | Payer: PRIVATE HEALTH INSURANCE | Attending: Family Medicine | Admitting: Family Medicine

## 2020-09-28 ENCOUNTER — Other Ambulatory Visit: Payer: Self-pay

## 2020-09-28 DIAGNOSIS — S86002A Unspecified injury of left Achilles tendon, initial encounter: Secondary | ICD-10-CM | POA: Diagnosis not present

## 2020-09-28 MED ORDER — PREDNISONE 20 MG PO TABS: 40.0000 mg | ORAL_TABLET | Freq: Every day | ORAL | 0 refills | Status: DC

## 2020-09-28 MED ORDER — TIZANIDINE HCL 4 MG PO TABS: 2.0000 mg | ORAL_TABLET | Freq: Four times a day (QID) | ORAL | 0 refills | Status: DC | PRN

## 2020-09-28 MED ORDER — KETOROLAC TROMETHAMINE 30 MG/ML IJ SOLN
INTRAMUSCULAR | Status: AC
  Filled 2020-09-28: qty 1

## 2020-09-28 MED ORDER — KETOROLAC TROMETHAMINE 30 MG/ML IJ SOLN
30.0000 mg | Freq: Once | INTRAMUSCULAR | Status: AC
  Administered 2020-09-28: 30 mg via INTRAMUSCULAR

## 2020-09-28 NOTE — Discharge Instructions (Addendum)
Follow-up with orthopedics first the next week for recheck

## 2020-09-28 NOTE — ED Triage Notes (Signed)
Pt reports lower left leg and left ankle pain since this morning after he trip from a ladder.

## 2020-09-28 NOTE — ED Provider Notes (Signed)
[facility]    CSN: [id number] Arrival date & time: 09/28/20  1139      History   Chief Complaint Chief Complaint  Patient presents with   Leg Pain    HPI [name] is a 65 y.o. male.   Patient presenting today with nearly 1 week of severe left Achilles tendon pain, mild inflammation to the area that acutely worsened today when he tripped over a ladder and strained the area further.  He is able to bear weight and slightly move at the ankle, but severely painful to do so.  Some tingling sensation to the rest of the foot but otherwise no abnormalities further distally on the foot.  Denies initial injury that he is aware of prior to today.  Has not been trying anything over-the-counter for symptoms.   Past Medical History:  Diagnosis Date   Erectile dysfunction    Essential hypertension 11/30/2019   Gout    Infected prepatellar bursa, right    Moderate episode of recurrent major depressive disorder ([place]) 08/26/2020   Polyarthritis    Pre-diabetes     Patient Active Problem List   Diagnosis Date Noted   CVD (cardiovascular disease) 08/27/2020   Moderate episode of recurrent major depressive disorder ([place]) 08/26/2020   Epidermoid cyst of skin of back    Grief reaction 02/09/2020   Influenza vaccine refused 01/17/2020   Hyperlipidemia 12/12/2019   Elevated PSA, less than 10 ng/ml 12/12/2019   Vitamin D deficiency 12/12/2019   Abnormal thyroid blood test 12/12/2019   Essential hypertension 11/30/2019   Subcutaneous nodule of back 11/30/2019   Prepatellar abscess 12/29/2018   Cellulitis of right knee 12/28/2018   Infected prepatellar bursa, right    Prediabetes 07/08/2016   Elevated blood pressure reading without diagnosis of hypertension 06/01/2016   Dental caries 05/21/2015   Thrombocytosis 04/24/2015   Erectile dysfunction 04/23/2015   Polyarthritis 03/08/2015   ROTATOR CUFF INJURY, LEFT SHOULDER 06/20/2009    Past Surgical History:  Procedure  Laterality Date   CHOLECYSTECTOMY     EXCISION OF BACK LESION Left 05/02/2020   Procedure: EXCISION OF BACK LESION, left lower back cyst;  Surgeon: Olean Ree, MD;  Location: [facility];  Service: General;  Laterality: Left;   IRRIGATION AND DEBRIDEMENT KNEE Right 12/28/2018   Procedure: IRRIGATION AND DEBRIDEMENT KNEE;  Surgeon: Mcarthur Rossetti, MD;  Location: [facility];  Service: Orthopedics;  Laterality: Right;   KNEE SURGERY     ROTATOR CUFF REPAIR     THUMB ARTHROSCOPY Right 2007       Home Medications    Prior to Admission medications   Medication Sig Start Date End Date Taking? Authorizing Provider  predniSONE (DELTASONE) 20 MG tablet Take 2 tablets (40 mg total) by mouth daily with breakfast. 09/28/20  Yes Volney American, PA-C  acetaminophen (TYLENOL) 500 MG tablet Take 1,000 mg by mouth every 6 (six) hours as needed for moderate pain.    [provider]  acetaminophen (TYLENOL) 500 MG tablet Take 2 tablets (1,000 mg total) by mouth every 6 (six) hours as needed for mild pain. 05/02/20   Olean Ree, MD  atorvastatin (LIPITOR) 10 MG tablet Take 1 tablet (10 mg total) by mouth daily. 08/26/20   Mayers, Cari S, PA-C  DULoxetine (CYMBALTA) 30 MG capsule Take 1 capsule (30 mg total) by mouth daily. 08/26/20   Mayers, Cari S, PA-C  ibuprofen (ADVIL) 600 MG tablet Take 1 tablet (600 mg total) by mouth every 8 (  eight) hours as needed for moderate pain. Patient not taking: No sig reported 05/02/20   Olean Ree, MD  lisinopril (ZESTRIL) 40 MG tablet Take 1 tablet (40 mg total) by mouth daily. 03/14/20   Nicolette Bang, MD  loratadine (CLARITIN) 10 MG tablet Take 1 tablet (10 mg total) by mouth daily. 07/08/20   Wieters, Hallie C, PA-C  predniSONE (DELTASONE) 20 MG tablet Take 1 tablet (20 mg total) by mouth daily. 08/26/20   Mayers, Cari S, PA-C  Saw Palmetto, Serenoa repens, (SAW PALMETTO PO) Take 1 capsule by mouth daily.    [provider]  tiZANidine  (ZANAFLEX) 4 MG tablet Take 0.5-1 tablets (2-4 mg total) by mouth every 6 (six) hours as needed for muscle spasms. Do not drink alcohol or drive while taking this medication.  May cause drowsiness. 09/28/20   Volney American, PA-C  Vitamin D, Ergocalciferol, (DRISDOL) 1.25 MG (50000 UNIT) CAPS capsule Take 1 capsule (50,000 Units total) by mouth every 7 (seven) days. 08/26/20   Mayers, Loraine Grip, PA-C    Family History Family History  Problem Relation Age of Onset   Breast cancer Mother    Prostate cancer Father    Hypertension Father    Lung cancer Son     Social History Social History   Tobacco Use   Smoking status: Never   Smokeless tobacco: Never  Vaping Use   Vaping Use: Never used  Substance Use Topics   Alcohol use: No    Alcohol/week: 0.0 standard drinks   Drug use: No     Allergies   Patient has no known allergies.   Review of Systems Review of Systems Per HPI  Physical Exam Triage Vital Signs ED Triage Vitals  Enc Vitals Group     BP 09/28/20 1331 (!) 164/87     Pulse Rate 09/28/20 1331 76     Resp 09/28/20 1331 18     Temp 09/28/20 1331 98.7 F (37.1 C)     Temp Source 09/28/20 1331 Oral     SpO2 09/28/20 1331 98 %     Weight --      Height --      Head Circumference --      Peak Flow --      Pain Score 09/28/20 1329 10     Pain Loc --      Pain Edu? --      Excl. in [place]? --    No data found.  Updated Vital Signs BP (!) 164/87 (BP Location: Right Arm)   Pulse 76   Temp 98.7 F (37.1 C) (Oral)   Resp 18   SpO2 98%   Visual Acuity Right Eye Distance:   Left Eye Distance:   Bilateral Distance:    Right Eye Near:   Left Eye Near:    Bilateral Near:     Physical Exam Vitals and nursing note reviewed.  Constitutional:      Appearance: Normal appearance.  HENT:     Head: Atraumatic.  Eyes:     Extraocular Movements: Extraocular movements intact.     Conjunctiva/sclera: Conjunctivae normal.  Cardiovascular:     Rate and Rhythm:  Normal rate and regular rhythm.  Pulmonary:     Effort: Pulmonary effort is normal.     Breath sounds: Normal breath sounds.  Musculoskeletal:        General: Swelling, tenderness and signs of injury present. Normal range of motion.     Cervical back: Normal range  of motion and neck supple.     Comments: Left Achilles diffusely edematous, tender to palpation.  Possibly a slight bulge/deformity where tendon inserts into gastrocnemius area of greatest tenderness.  Does have some guarded range of motion in the ankle and states he was able to walk into the clinic today though very painful to do so.  Currently in a wheelchair, ambulatory at baseline.  Skin:    General: Skin is warm and dry.     Findings: No erythema.  Neurological:     General: No focal deficit present.     Mental Status: He is oriented to person, place, and time.     Comments: Left lower extremity neurovascularly intact  Psychiatric:        Mood and Affect: Mood normal.        Thought Content: Thought content normal.        Judgment: Judgment normal.     UC Treatments / Results  Labs (all labs ordered are listed, but only abnormal results are displayed) Labs Reviewed - No data to display  EKG   Radiology No results found.  Procedures Procedures (including critical care time)  Medications Ordered in UC Medications  ketorolac (TORADOL) 30 MG/ML injection 30 mg (has no administration in time range)    Initial Impression / Assessment and Plan / UC Course  I have reviewed the triage vital signs and the nursing notes.  Pertinent labs & imaging results that were available during my care of the patient were reviewed by me and considered in my medical decision making (see chart for details).     Suspect left Achilles strain, possibly a partial tear but very low suspicion for a full rupture of Achilles tendon given some range of motion intact and have been weightbearing this morning.  We will treat with IM Toradol,  prednisone, Zanaflex and close orthopedic follow-up early next week.  RICE protocol reviewed.  Return for acutely worsening symptoms  Final Clinical Impressions(s) / UC Diagnoses   Final diagnoses:  Injury of left Achilles tendon, initial encounter     Discharge Instructions      Follow-up with orthopedics first the next week for recheck     ED Prescriptions     Medication Sig Dispense Auth. Provider   tiZANidine (ZANAFLEX) 4 MG tablet Take 0.5-1 tablets (2-4 mg total) by mouth every 6 (six) hours as needed for muscle spasms. Do not drink alcohol or drive while taking this medication.  May cause drowsiness. 15 tablet Volney American, [place]   predniSONE (DELTASONE) 20 MG tablet Take 2 tablets (40 mg total) by mouth daily with breakfast. 8 tablet Volney American, [place]      PDMP not reviewed this encounter.   Volney American, [place] 09/28/20 1440

## 2020-10-22 NOTE — Progress Notes (Deleted)
Cardiology Office Note   Date:  10/22/2020   ID:  [name], DOB 01/30/1956, MRN [id number]  PCP:  Nicolette Bang, MD  Cardiologist:   None Referring:  ***  No chief complaint on file.     History of Present Illness: [name] is a 65 y.o. male who presents for ***     Past Medical History:  Diagnosis Date   Erectile dysfunction    Essential hypertension 11/30/2019   Gout    Infected prepatellar bursa, right    Moderate episode of recurrent major depressive disorder ([place]) 08/26/2020   Polyarthritis    Pre-diabetes     Past Surgical History:  Procedure Laterality Date   CHOLECYSTECTOMY     EXCISION OF BACK LESION Left 05/02/2020   Procedure: EXCISION OF BACK LESION, left lower back cyst;  Surgeon: Olean Ree, MD;  Location: [facility] ORS;  Service: General;  Laterality: Left;   IRRIGATION AND DEBRIDEMENT KNEE Right 12/28/2018   Procedure: IRRIGATION AND DEBRIDEMENT KNEE;  Surgeon: Mcarthur Rossetti, MD;  Location: [facility];  Service: Orthopedics;  Laterality: Right;   KNEE SURGERY     ROTATOR CUFF REPAIR     THUMB ARTHROSCOPY Right 2007     Current Outpatient Medications  Medication Sig Dispense Refill   acetaminophen (TYLENOL) 500 MG tablet Take 1,000 mg by mouth every 6 (six) hours as needed for moderate pain.     acetaminophen (TYLENOL) 500 MG tablet Take 2 tablets (1,000 mg total) by mouth every 6 (six) hours as needed for mild pain.     atorvastatin (LIPITOR) 10 MG tablet Take 1 tablet (10 mg total) by mouth daily. 90 tablet 3   DULoxetine (CYMBALTA) 30 MG capsule Take 1 capsule (30 mg total) by mouth daily. 30 capsule 1   ibuprofen (ADVIL) 600 MG tablet Take 1 tablet (600 mg total) by mouth every 8 (eight) hours as needed for moderate pain. (Patient not taking: No sig reported) 60 tablet 1   lisinopril (ZESTRIL) 40 MG tablet Take 1 tablet (40 mg total) by mouth daily. 90 tablet 1   loratadine (CLARITIN) 10 MG tablet Take 1 tablet (10 mg  total) by mouth daily. 15 tablet 0   predniSONE (DELTASONE) 20 MG tablet Take 1 tablet (20 mg total) by mouth daily. 30 tablet 0   predniSONE (DELTASONE) 20 MG tablet Take 2 tablets (40 mg total) by mouth daily with breakfast. 8 tablet 0   Saw Palmetto, Serenoa repens, (SAW PALMETTO PO) Take 1 capsule by mouth daily.     tiZANidine (ZANAFLEX) 4 MG tablet Take 0.5-1 tablets (2-4 mg total) by mouth every 6 (six) hours as needed for muscle spasms. Do not drink alcohol or drive while taking this medication.  May cause drowsiness. 15 tablet 0   Vitamin D, Ergocalciferol, (DRISDOL) 1.25 MG (50000 UNIT) CAPS capsule Take 1 capsule (50,000 Units total) by mouth every 7 (seven) days. 4 capsule 2   No current facility-administered medications for this visit.    Allergies:   Patient has no known allergies.    Social History:  The patient  reports that he has never smoked. He has never used smokeless tobacco. He reports that he does not drink alcohol and does not use drugs.   Family History:  The patient's ***family history includes Breast cancer in his mother; Hypertension in his father; Lung cancer in his son; Prostate cancer in his father.    ROS:  Please see the history of present illness.  Otherwise, review of systems are positive for {NONE DEFAULTED:18576}.   All other systems are reviewed and negative.    PHYSICAL EXAM: VS:  There were no vitals taken for this visit. , BMI There is no height or weight on file to calculate BMI. GENERAL:  Well appearing HEENT:  Pupils equal round and reactive, fundi not visualized, oral mucosa unremarkable NECK:  No jugular venous distention, waveform within normal limits, carotid upstroke brisk and symmetric, no bruits, no thyromegaly LYMPHATICS:  No cervical, inguinal adenopathy LUNGS:  Clear to auscultation bilaterally BACK:  No CVA tenderness CHEST:  Unremarkable HEART:  PMI not displaced or sustained,S1 and S2 within normal limits, no S3, no S4, no  clicks, no rubs, *** murmurs ABD:  Flat, positive bowel sounds normal in frequency in pitch, no bruits, no rebound, no guarding, no midline pulsatile mass, no hepatomegaly, no splenomegaly EXT:  2 plus pulses throughout, no edema, no cyanosis no clubbing SKIN:  No rashes no nodules NEURO:  Cranial nerves II through XII grossly intact, motor grossly intact throughout PSYCH:  Cognitively intact, oriented to person place and time    EKG:  EKG {ACTION; IS/IS [id number] ordered today. The ekg ordered today demonstrates ***   Recent Labs: 12/12/2019: TSH 0.779 03/14/2020: BUN 8; Creatinine, Ser 0.92; Hemoglobin 13.6; Platelets 372; Potassium 4.4; Sodium 137    Lipid Panel    Component Value Date/Time   CHOL 193 11/30/2019 1109   TRIG 93 11/30/2019 1109   HDL 59 11/30/2019 1109   CHOLHDL 3.3 11/30/2019 1109   LDLCALC 117 (H) 11/30/2019 1109      Wt Readings from Last 3 Encounters:  08/26/20 200 lb (90.7 kg)  04/29/20 192 lb 6.4 oz (87.3 kg)  04/26/20 196 lb (88.9 kg)      Other studies Reviewed: Additional studies/ records that were reviewed today include: ***. Review of the above records demonstrates:  Please see elsewhere in the note.  ***   ASSESSMENT AND PLAN:  ***   Current medicines are reviewed at length with the patient today.  The patient {ACTIONS; HAS/DOES NOT HAVE:19233} concerns regarding medicines.  The following changes have been made:  {PLAN; NO CHANGE:13088:s}  Labs/ tests ordered today include: *** No orders of the defined types were placed in this encounter.    Disposition:   FU with ***    Signed, Minus Breeding, MD  10/22/2020 10:20 PM    [facility]

## 2020-10-23 ENCOUNTER — Ambulatory Visit: Payer: PRIVATE HEALTH INSURANCE | Admitting: Cardiology

## 2020-10-28 ENCOUNTER — Encounter: Payer: Self-pay | Admitting: Cardiology

## 2020-10-28 ENCOUNTER — Ambulatory Visit (INDEPENDENT_AMBULATORY_CARE_PROVIDER_SITE_OTHER): Payer: PRIVATE HEALTH INSURANCE | Admitting: Cardiology

## 2020-10-28 ENCOUNTER — Other Ambulatory Visit: Payer: Self-pay

## 2020-10-28 VITALS — BP 138/78 | HR 85 | Ht 67.5 in | Wt 197.0 lb

## 2020-10-28 DIAGNOSIS — I10 Essential (primary) hypertension: Secondary | ICD-10-CM | POA: Diagnosis not present

## 2020-10-28 DIAGNOSIS — E7800 Pure hypercholesterolemia, unspecified: Secondary | ICD-10-CM

## 2020-10-28 DIAGNOSIS — Z8249 Family history of ischemic heart disease and other diseases of the circulatory system: Secondary | ICD-10-CM

## 2020-10-28 MED ORDER — METOPROLOL TARTRATE 100 MG PO TABS: 100.0000 mg | ORAL_TABLET | Freq: Once | ORAL | 0 refills | Status: DC

## 2020-10-28 NOTE — Addendum Note (Signed)
Addended by: Antonieta Iba on: 10/28/2020 02:38 PM   Modules accepted: Orders

## 2020-10-28 NOTE — Progress Notes (Signed)
Cardiology CONSULT Note    Date:  10/28/2020   ID:  Lj Sliman, DOB 1955/04/04, MRN [id number]  PCP:  Nicolette Bang, MD  Cardiologist:  Fransico Him, MD   Chief Complaint  Patient presents with  . New Patient (Initial Visit)    Family hx of CVD with personal hx of pre DM, HTN and HLD     History of Present Illness:  [name] is a 65 y.o. male who is being seen today for the evaluation of strong family hx of CAD at the request of Mayers, Cari S, PA-C.  This is a 65yo AAM with a hx of HTN, pre DM and family hx of cardiovascular disease.  He has 6 brothers and 3 of them have had MIs and 2 had CABG.  He says that if he tries to run any he will be very exhausted after and has noticed decreased exercise tolerance. He has recently noticed DOE but thinks he may be out of shape.  He denies any chest pain, PND, orthopnea, LE edema (except with chronic joint inflammation), dizziness, palpitations or syncope. He is compliant with his meds and is tolerating meds with no SE.     Past Medical History:  Diagnosis Date  . Erectile dysfunction   . Essential hypertension 11/30/2019  . Gout   . Infected prepatellar bursa, right   . Moderate episode of recurrent major depressive disorder ([place]) 08/26/2020  . Polyarthritis   . Pre-diabetes     Past Surgical History:  Procedure Laterality Date  . CHOLECYSTECTOMY    . EXCISION OF BACK LESION Left 05/02/2020   Procedure: EXCISION OF BACK LESION, left lower back cyst;  Surgeon: Olean Ree, MD;  Location: [facility] ORS;  Service: General;  Laterality: Left;  . IRRIGATION AND DEBRIDEMENT KNEE Right 12/28/2018   Procedure: IRRIGATION AND DEBRIDEMENT KNEE;  Surgeon: Mcarthur Rossetti, MD;  Location: [facility];  Service: Orthopedics;  Laterality: Right;  . KNEE SURGERY    . ROTATOR CUFF REPAIR    . THUMB ARTHROSCOPY Right 2007    Current Medications: Current Meds  Medication Sig  . acetaminophen (TYLENOL) 500 MG tablet Take 2  tablets (1,000 mg total) by mouth every 6 (six) hours as needed for mild pain.  Marland Kitchen atorvastatin (LIPITOR) 10 MG tablet Take 1 tablet (10 mg total) by mouth daily.  . DULoxetine (CYMBALTA) 30 MG capsule Take 1 capsule (30 mg total) by mouth daily.  Marland Kitchen ibuprofen (ADVIL) 600 MG tablet Take 1 tablet (600 mg total) by mouth every 8 (eight) hours as needed for moderate pain.  Marland Kitchen lisinopril (ZESTRIL) 10 MG tablet Take 10 mg by mouth daily.  Marland Kitchen loratadine (CLARITIN) 10 MG tablet Take 1 tablet (10 mg total) by mouth daily.  . predniSONE (DELTASONE) 20 MG tablet Take 2 tablets (40 mg total) by mouth daily with breakfast.  . Saw Palmetto, Serenoa repens, (SAW PALMETTO PO) Take 1 capsule by mouth daily.  Marland Kitchen tiZANidine (ZANAFLEX) 4 MG tablet Take 0.5-1 tablets (2-4 mg total) by mouth every 6 (six) hours as needed for muscle spasms. Do not drink alcohol or drive while taking this medication.  May cause drowsiness.  . Vitamin D, Ergocalciferol, (DRISDOL) 1.25 MG (50000 UNIT) CAPS capsule Take 1 capsule (50,000 Units total) by mouth every 7 (seven) days.  . [DISCONTINUED] lisinopril (ZESTRIL) 40 MG tablet Take 1 tablet (40 mg total) by mouth daily.    Allergies:   Patient has no known allergies.   Social History  Socioeconomic History  . Marital status: Married    Spouse name: Not on file  . Number of children: Not on file  . Years of education: Not on file  . Highest education level: Not on file  Occupational History  . Not on file  Tobacco Use  . Smoking status: Never  . Smokeless tobacco: Never  Vaping Use  . Vaping Use: Never used  Substance and Sexual Activity  . Alcohol use: No    Alcohol/week: 0.0 standard drinks  . Drug use: No  . Sexual activity: Not Currently  Other Topics Concern  . Not on file  Social History Narrative  . Not on file   Social Determinants of Health   Financial Resource Strain: Not on file  Food Insecurity: Not on file  Transportation Needs: Not on file  Physical  Activity: Not on file  Stress: Not on file  Social Connections: Not on file     Family History:  The patient's family history includes Breast cancer in his mother; Coronary artery disease (age of onset: 48) in his brother; Coronary artery disease (age of onset: 43) in his brother and brother; Hypertension in his father; Lung cancer in his son; Prostate cancer in his father.   ROS:   Please see the history of present illness.    ROS All other systems reviewed and are negative.  No flowsheet data found.  PHYSICAL EXAM:   VS:  BP 138/78   Pulse 85   Ht 5' 7.5" (1.715 m)   Wt 197 lb (89.4 kg)   SpO2 96%   BMI 30.40 kg/m    GEN: Well nourished, well developed, in no acute distress  HEENT: normal  Neck: no JVD, carotid bruits, or masses Cardiac: RRR; no murmurs, rubs, or gallops,no edema.  Intact distal pulses bilaterally.  Respiratory:  clear to auscultation bilaterally, normal work of breathing GI: soft, nontender, nondistended, + BS MS: no deformity or atrophy  Skin: warm and dry, no rash Neuro:  Alert and Oriented x 3, Strength and sensation are intact Psych: euthymic mood, full affect  Wt Readings from Last 3 Encounters:  10/28/20 197 lb (89.4 kg)  08/26/20 200 lb (90.7 kg)  04/29/20 192 lb 6.4 oz (87.3 kg)      Studies/Labs Reviewed:   EKG:  EKG is not ordered today.  The ekg ordered 12/2019 demonstrates NSR with nonspecific T wave abnormality  Recent Labs: 12/12/2019: TSH 0.779 03/14/2020: BUN 8; Creatinine, Ser 0.92; Hemoglobin 13.6; Platelets 372; Potassium 4.4; Sodium 137   Lipid Panel    Component Value Date/Time   CHOL 193 11/30/2019 1109   TRIG 93 11/30/2019 1109   HDL 59 11/30/2019 1109   CHOLHDL 3.3 11/30/2019 1109   LDLCALC 117 (H) 11/30/2019 1109     Additional studies/ records that were reviewed today include:  OV notes from PCP    ASSESSMENT:    1. Family history of cardiovascular disease   2. Essential hypertension   3. Pure  hypercholesterolemia      PLAN:  In order of problems listed above:  Family history of cardiovascular disease -he is completely asymptomatic from a cardiac standpoint but has significant CRFs including pre DM, HTN, HLD and fm hx of CVD -EKG is done today is nonischemia -will get a coronary Ca score to assess future risk  2.  HTN -BP controlled on exam  -Continue prescription drug management with Lisinopril '10mg'$  daily  3.  HLD -LDL goal < 100 -I have personally  reviewed and interpreted outside labs performed by patient's PCP which showed LDL 117, HDL 59, TAGs 93 and  in Sept 2022 -Continue prescription drug management with Atorvastatin '10mg'$  daily    -If he has CAD then LDL goal will be < 70  Time Spent: 25 minutes total time of encounter, including 15 minutes spent in face-to-face patient care on the date of this encounter. This time includes coordination of care and counseling regarding above mentioned problem list. Remainder of non-face-to-face time involved reviewing chart documents/testing relevant to the patient encounter and documentation in the medical record. I have independently reviewed documentation from referring provider  Medication Adjustments/Labs and Tests Ordered: Current medicines are reviewed at length with the patient today.  Concerns regarding medicines are outlined above.  Medication changes, Labs and Tests ordered today are listed in the Patient Instructions below.  There are no Patient Instructions on file for this visit.   Signed, Fransico Him, MD  10/28/2020 2:30 PM    [address], [place], [place]  [address] Phone: [phone]; Fax: [phone]

## 2020-10-28 NOTE — Patient Instructions (Signed)
Medication Instructions:  Your physician recommends that you continue on your current medications as directed. Please refer to the Current Medication list given to you today.  *If you need a refill on your cardiac medications before your next appointment, please call your pharmacy*   Lab Work: TODAY: BMET If you have labs (blood work) drawn today and your tests are completely normal, you will receive your results only by: [place] (if you have MyChart) OR A paper copy in the mail If you have any lab test that is abnormal or we need to change your treatment, we will call you to review the results.   Testing/Procedures: Your provider recommends that you have a coronary CTA scan.    Follow-Up: At [facility], you and your health needs are our priority.  As part of our continuing mission to provide you with exceptional heart care, we have created designated Provider Care Teams.  These Care Teams include your primary Cardiologist (physician) and Advanced Practice Providers (APPs -  Physician Assistants and Nurse Practitioners) who all work together to provide you with the care you need, when you need it.  Follow up with Dr. Radford Pax as needed based on results of testing.    Other Instructions   Your cardiac CT will be scheduled at:  [facility] [address] [place], [place] [address] [phone]  Please arrive at the [facility] main entrance (entrance A) of [facility] 30 minutes prior to test start time. Proceed to the [facility] Radiology Department (first floor) to check-in and test prep.  Please follow these instructions carefully (unless otherwise directed):  On the Night Before the Test: Be sure to Drink plenty of water. Do not consume any caffeinated/decaffeinated beverages or chocolate 12 hours prior to your test. Do not take any antihistamines 12 hours prior to your test.  On the Day of the Test: Drink plenty of water until 1 hour  prior to the test. Do not eat any food 4 hours prior to the test. You may take your regular medications prior to the test.  Take metoprolol (Lopressor) two hours prior to test. HOLD Furosemide/Hydrochlorothiazide morning of the test. FEMALES- please wear underwire-free bra if available, avoid dresses & tight clothing       After the Test: Drink plenty of water. After receiving IV contrast, you may experience a mild flushed feeling. This is normal. On occasion, you may experience a mild rash up to 24 hours after the test. This is not dangerous. If this occurs, you can take Benadryl 25 mg and increase your fluid intake. If you experience trouble breathing, this can be serious. If it is severe call 911 IMMEDIATELY. If it is mild, please call our office. If you take any of these medications: Glipizide/Metformin, Avandament, Glucavance, please do not take 48 hours after completing test unless otherwise instructed.  Please allow 2-4 weeks for scheduling of routine cardiac CTs. Some insurance companies require a pre-authorization which may delay scheduling of this test.   For non-scheduling related questions, please contact the cardiac imaging nurse navigator should you have any questions/concerns: Marchia Bond, Cardiac Imaging Nurse Navigator Gordy Clement, Cardiac Imaging Nurse Navigator [facility] Direct Office Dial: [phone]   For scheduling needs, including cancellations and rescheduling, please call [place], [phone].

## 2020-10-29 LAB — BASIC METABOLIC PANEL
BUN/Creatinine Ratio: 9 — ABNORMAL LOW (ref 10–24)
BUN: 9 mg/dL (ref 8–27)
CO2: 24 mmol/L (ref 20–29)
Calcium: 10 mg/dL (ref 8.6–10.2)
Chloride: 103 mmol/L (ref 96–106)
Creatinine, Ser: 1.01 mg/dL (ref 0.76–1.27)
Glucose: 112 mg/dL — ABNORMAL HIGH (ref 65–99)
Potassium: 4.8 mmol/L (ref 3.5–5.2)
Sodium: 139 mmol/L (ref 134–144)
eGFR: 83 mL/min/{1.73_m2} (ref >59)

## 2020-10-31 DIAGNOSIS — R931 Abnormal findings on diagnostic imaging of heart and coronary circulation: Secondary | ICD-10-CM

## 2020-10-31 HISTORY — DX: Abnormal findings on diagnostic imaging of heart and coronary circulation: R93.1

## 2020-11-01 ENCOUNTER — Telehealth (HOSPITAL_COMMUNITY): Payer: Self-pay | Admitting: Emergency Medicine

## 2020-11-01 NOTE — Telephone Encounter (Signed)
Reaching out to patient to offer assistance regarding upcoming cardiac imaging study; pt verbalizes understanding of appt date/time, parking situation and where to check in, pre-test NPO status and medications ordered, and verified current allergies; name and call back number provided for further questions should they arise Marchia Bond RN Navigator Cardiac Imaging [facility] [phone] office [phone] cell   Denies iv issues denies claustro '100mg'$  metopolol 2 hr prior

## 2020-11-05 ENCOUNTER — Ambulatory Visit (HOSPITAL_COMMUNITY): Admission: RE | Admit: 2020-11-05 | Payer: PRIVATE HEALTH INSURANCE | Source: Ambulatory Visit

## 2020-11-08 ENCOUNTER — Telehealth (HOSPITAL_COMMUNITY): Payer: Self-pay | Admitting: Emergency Medicine

## 2020-11-08 NOTE — Telephone Encounter (Signed)
Unable to leave vm since vm box is full  Marchia Bond RN Navigator Cardiac [facility] [phone] Office  [phone] Cell

## 2020-11-12 ENCOUNTER — Ambulatory Visit (HOSPITAL_COMMUNITY)
Admission: RE | Admit: 2020-11-12 | Discharge: 2020-11-12 | Disposition: A | Discharge status: Home / Self Care | Payer: PRIVATE HEALTH INSURANCE | Source: Ambulatory Visit | Attending: Cardiology | Admitting: Cardiology

## 2020-11-12 ENCOUNTER — Encounter: Payer: Self-pay | Admitting: Cardiology

## 2020-11-12 ENCOUNTER — Other Ambulatory Visit: Payer: Self-pay

## 2020-11-12 DIAGNOSIS — I2510 Atherosclerotic heart disease of native coronary artery without angina pectoris: Secondary | ICD-10-CM | POA: Diagnosis not present

## 2020-11-12 DIAGNOSIS — E7800 Pure hypercholesterolemia, unspecified: Secondary | ICD-10-CM | POA: Insufficient documentation

## 2020-11-12 DIAGNOSIS — Z136 Encounter for screening for cardiovascular disorders: Secondary | ICD-10-CM | POA: Insufficient documentation

## 2020-11-12 DIAGNOSIS — I700 Atherosclerosis of aorta: Secondary | ICD-10-CM | POA: Diagnosis not present

## 2020-11-12 DIAGNOSIS — Z8249 Family history of ischemic heart disease and other diseases of the circulatory system: Secondary | ICD-10-CM | POA: Diagnosis not present

## 2020-11-12 DIAGNOSIS — I10 Essential (primary) hypertension: Secondary | ICD-10-CM | POA: Insufficient documentation

## 2020-11-12 DIAGNOSIS — R931 Abnormal findings on diagnostic imaging of heart and coronary circulation: Secondary | ICD-10-CM | POA: Insufficient documentation

## 2020-11-12 DIAGNOSIS — Z006 Encounter for examination for normal comparison and control in clinical research program: Secondary | ICD-10-CM

## 2020-11-12 IMAGING — CT CT HEART MORP W/ CTA COR W/ SCORE W/ CA W/CM &/OR W/O CM
4 of 7 series · 8 of 20 positions shown, 9 images · IV contrast (APPLIED)
Comparison: None.
COMPARISON: None.

Addendum:
EXAM:
OVER-READ INTERPRETATION  CT CHEST

The following report is an over-read performed by radiologist Dr.
RINCHIN [REDACTED] on [DATE]. This over-read
does not include interpretation of cardiac or coronary anatomy or
pathology. The coronary CTA interpretation by the cardiologist is
attached.
CLINICAL DATA: 64 yo male with chest pain
Cardiac/Coronary CTA
TECHNIQUE: A non-contrast, gated CT scan was obtained with axial slices of 3 mm
through the heart for calcium scoring. Calcium scoring was performed
using the Agatston method. A 120 kV prospective, gated, contrast
cardiac scan was obtained. Gantry rotation speed was 250 msecs and
collimation was 0.6 mm. Two sublingual nitroglycerin tablets (0.8
mg) were given. The 3D data set was reconstructed in 5% intervals of
the 35-75% of the R-R cycle. Diastolic phases were analyzed on a
dedicated workstation using MPR, MIP, and VRT modes. The patient
received 95 cc of contrast.

[Series 6: ts diast sharp · axial · 0.39mm/px · z∈[+1307,+1345]mm · 2 of 286 slices shown]
[im 96/286  lung]
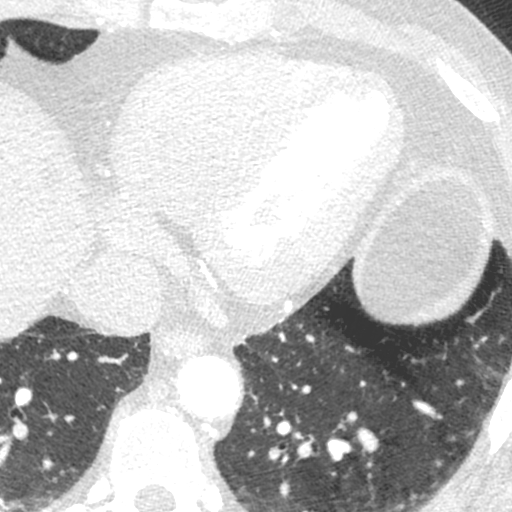
[im 191/286  lung]
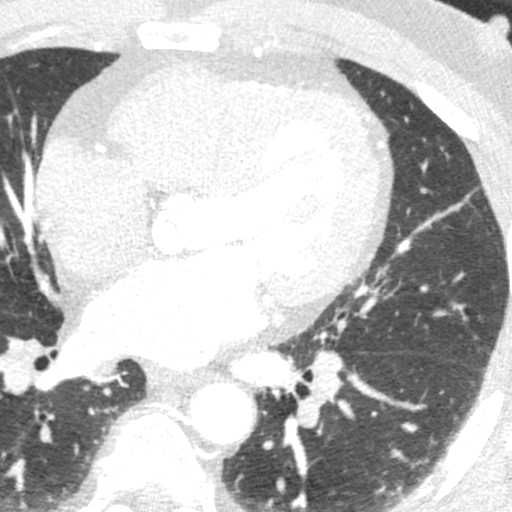

[Series 7: best diast · axial · 0.39mm/px · z∈[+1307,+1345]mm · 2 of 286 slices shown, 3 images]
[im 96/286  vessel]
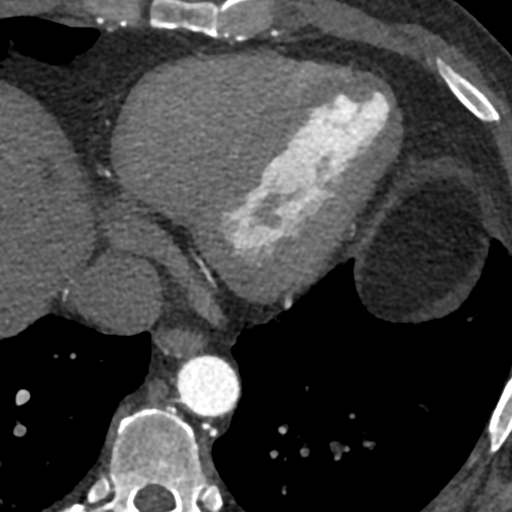
[im 96/286  lung]
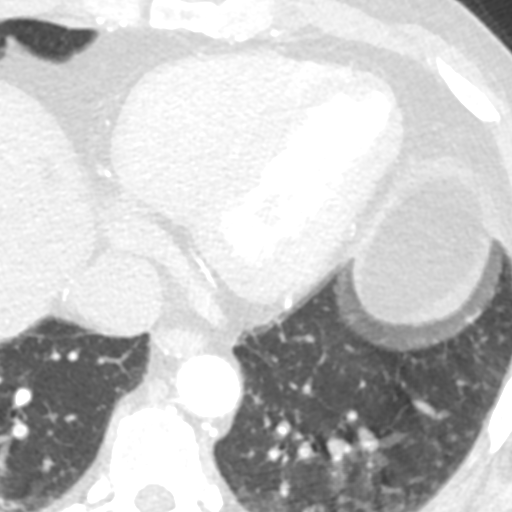
[im 191/286  vessel]
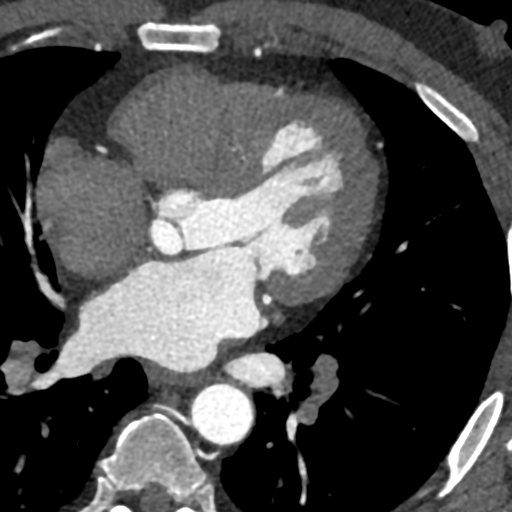

[Series 8: best syst · axial · 0.39mm/px · z∈[+1307,+1345]mm · 2 of 286 slices shown]
[im 96/286  vessel]
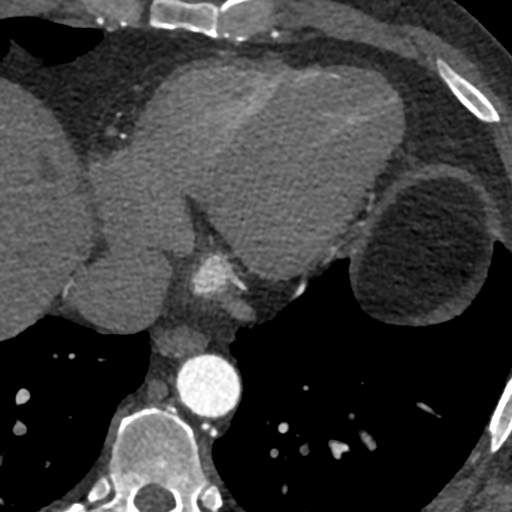
[im 191/286  vessel]
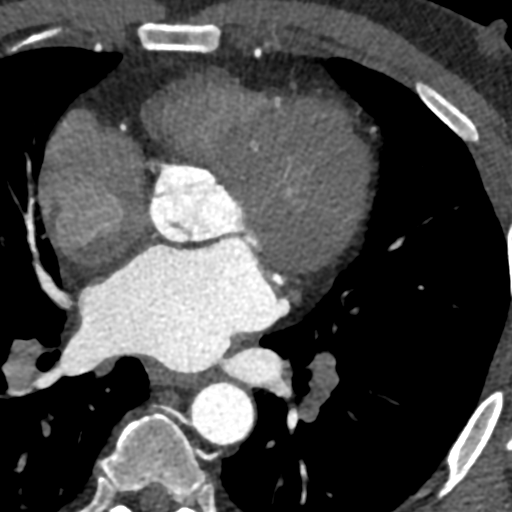

[Series 9: ts syst sharp · axial · 0.39mm/px · z∈[+1307,+1345]mm · 2 of 286 slices shown]
[im 96/286  lung]
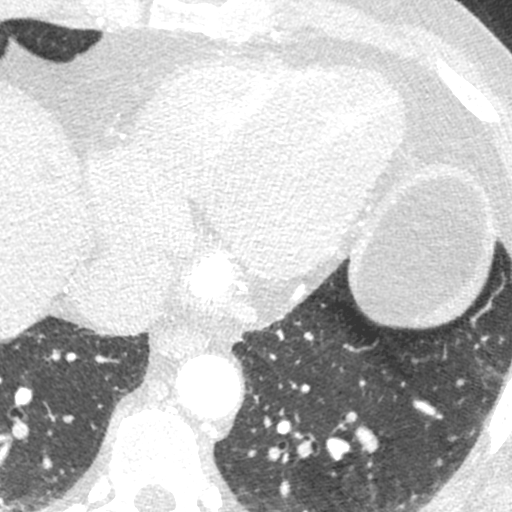
[im 191/286  lung]
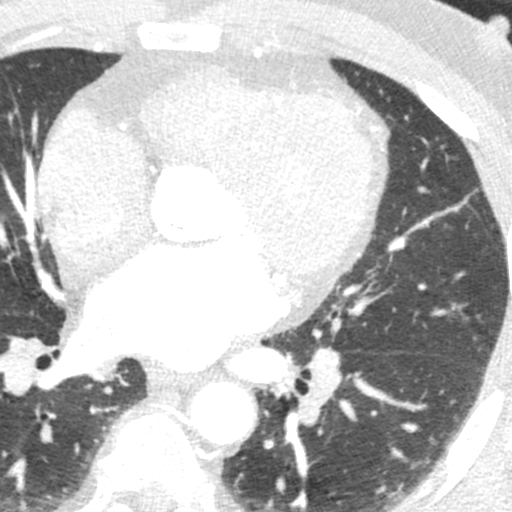

[8 of 20 positions shown; findings below may reference images not displayed]

FINDINGS: Vascular: Heart is normal size.  Aorta normal caliber.

Mediastinum/Nodes: No adenopathy.

Lungs/Pleura: Linear scarring in the lingula and right middle lobe.
No effusions.

Upper Abdomen: Imaging into the upper abdomen demonstrates no acute
findings.

Musculoskeletal: Chest wall soft tissues are unremarkable. No acute
bone abnormality.
IMPRESSION: No acute extra cardiac abnormality.

Bibasilar scarring.
FINDINGS: Image quality: Average.

Noise artifact is: moderate. (mis-registration).

Coronary Arteries:  Normal coronary origin.  Right dominance.

Left main: The left main is a large caliber vessel with a normal
take off from the left coronary cusp that trifurcates into a LAD,
LCX, and ramus intermedius. There is no plaque or stenosis.

Left anterior descending artery: The LAD is patent without evidence
of plaque or stenosis. The LAD gives off 1 large branching patent
diagonal branch.

Ramus intermedius: patent with no evidence of plaque or stenosis.

Left circumflex artery: The LCX is non-dominant; there is minimal
(0-24) calcified plaque in the proximal, mid and distal vessel. The
LCX gives off 1 small patent obtuse marginal and then 2 larger
patent OM branches.

Right coronary artery: The RCA is dominant with high take off from
the right coronary cusp. There is minimal (0-24) calcified plaque in
the proximal vessel. The RCA terminates as a small PDA without
evidence of plaque or stenosis.

Right Atrium: Right atrial size is within normal limits.

Right Ventricle: The right ventricular cavity is within normal
limits.

Left Atrium: Left atrial size is normal in size with no left atrial
appendage filling defect.

Left Ventricle: The ventricular cavity size is within normal limits.
There are no stigmata of prior infarction. There is no abnormal
filling defect.

Pulmonary arteries: Normal in size.

Pulmonary veins: Normal pulmonary venous drainage.

Pericardium: Normal thickness with no significant effusion or
calcium present.

Cardiac valves: The aortic valve is trileaflet with mild
calcification on left coronary cusp. The mitral valve is normal
structure without significant calcification.

Aorta: Normal caliber; aortic atherosclerosis.

Extra-cardiac findings: See attached radiology report for
non-cardiac structures.
IMPRESSION: 1. Coronary calcium score of 237. This was 86 percentile for age-,
sex, and race-matched controls.

2. Normal coronary origin with right dominance.

3. Minimal CAD as outlined above.

4. Aortic atherosclerosis.

RECOMMENDATIONS:
CAD-RADS 1: Minimal non-obstructive CAD (0-24%). Consider
non-atherosclerotic causes of chest pain. Consider preventive
therapy and risk factor modification.

*** End of Addendum ***
EXAM:
OVER-READ INTERPRETATION  CT CHEST

The following report is an over-read performed by radiologist Dr.
RINCHIN [REDACTED] on [DATE]. This over-read
does not include interpretation of cardiac or coronary anatomy or
pathology. The coronary CTA interpretation by the cardiologist is
attached.
FINDINGS: Vascular: Heart is normal size.  Aorta normal caliber.

Mediastinum/Nodes: No adenopathy.

Lungs/Pleura: Linear scarring in the lingula and right middle lobe.
No effusions.

Upper Abdomen: Imaging into the upper abdomen demonstrates no acute
findings.

Musculoskeletal: Chest wall soft tissues are unremarkable. No acute
bone abnormality.
IMPRESSION: No acute extra cardiac abnormality.

Bibasilar scarring.

## 2020-11-12 MED ORDER — IOHEXOL 350 MG/ML SOLN
95.0000 mL | Freq: Once | INTRAVENOUS | Status: AC | PRN
  Administered 2020-11-12: 95 mL via INTRAVENOUS

## 2020-11-12 MED ORDER — NITROGLYCERIN 0.4 MG SL SUBL
SUBLINGUAL_TABLET | SUBLINGUAL | Status: AC
  Filled 2020-11-12: qty 2

## 2020-11-12 MED ORDER — NITROGLYCERIN 0.4 MG SL SUBL
0.8000 mg | SUBLINGUAL_TABLET | Freq: Once | SUBLINGUAL | Status: AC
  Administered 2020-11-12: 0.8 mg via SUBLINGUAL

## 2020-11-12 NOTE — Research (Signed)
IDENTIFY Informed Consent                  Subject Name: [name]    Subject met inclusion and exclusion criteria.  The informed consent form, study requirements and expectations were reviewed with the subject and questions and concerns were addressed prior to the signing of the consent form.  The subject verbalized understanding of the trial requirements.  The subject agreed to participate in the IDENTIFY trial and signed the informed consent at 10:15AM on 11/12/20.  The informed consent was obtained prior to performance of any protocol-specific procedures for the subject.  A copy of the signed informed consent was given to the subject and a copy was placed in the subject's medical record.    Lawson, Julie Hardin, Research Coordinator   

## 2020-11-15 ENCOUNTER — Telehealth: Payer: Self-pay

## 2020-11-15 DIAGNOSIS — E7800 Pure hypercholesterolemia, unspecified: Secondary | ICD-10-CM

## 2020-11-15 MED ORDER — ASPIRIN EC 81 MG PO TBEC: 81.0000 mg | DELAYED_RELEASE_TABLET | Freq: Every day | ORAL | 3 refills | Status: AC

## 2020-11-15 NOTE — Telephone Encounter (Signed)
The patient has been notified of the result and verbalized understanding.  All questions (if any) were answered. Antonieta Iba, RN 11/15/2020 9:27 AM  Lab work has been scheduled.

## 2020-11-15 NOTE — Telephone Encounter (Signed)
-----   Message from Sueanne Margarita, MD sent at 11/12/2020  8:06 PM EDT ----- Coronary CTA showed increased coronary Ca at 237 with minimal CAD in the LCx and RCA.  Add ASA '81mg'$  daily and have patient come in for FLP and ALT>

## 2020-11-18 ENCOUNTER — Other Ambulatory Visit: Payer: PRIVATE HEALTH INSURANCE | Admitting: *Deleted

## 2020-11-18 ENCOUNTER — Other Ambulatory Visit: Payer: Self-pay

## 2020-11-18 DIAGNOSIS — E7800 Pure hypercholesterolemia, unspecified: Secondary | ICD-10-CM

## 2020-11-18 LAB — LIPID PANEL
Chol/HDL Ratio: 3 ratio (ref 0.0–5.0)
Cholesterol, Total: 187 mg/dL (ref 100–199)
HDL: 62 mg/dL (ref >39)
LDL Chol Calc (NIH): 103 mg/dL — ABNORMAL HIGH (ref 0–99)
Triglycerides: 125 mg/dL (ref 0–149)
VLDL Cholesterol Cal: 22 mg/dL (ref 5–40)

## 2020-11-18 LAB — ALT: ALT: 16 IU/L (ref 0–44)

## 2020-11-21 ENCOUNTER — Other Ambulatory Visit: Payer: Self-pay | Admitting: Physician Assistant

## 2020-11-21 NOTE — Telephone Encounter (Signed)
Per patient request Lisinopril10 mg refilled for courtesy 30 day supply. Please schedule appointment for additional refills

## 2020-12-24 ENCOUNTER — Other Ambulatory Visit: Payer: Self-pay

## 2020-12-24 ENCOUNTER — Ambulatory Visit (INDEPENDENT_AMBULATORY_CARE_PROVIDER_SITE_OTHER): Payer: PRIVATE HEALTH INSURANCE | Admitting: Nurse Practitioner

## 2020-12-24 VITALS — BP 145/84 | HR 78 | Resp 18

## 2020-12-24 DIAGNOSIS — G8929 Other chronic pain: Secondary | ICD-10-CM | POA: Diagnosis not present

## 2020-12-24 DIAGNOSIS — M2550 Pain in unspecified joint: Secondary | ICD-10-CM | POA: Diagnosis not present

## 2020-12-24 MED ORDER — PREDNISONE 20 MG PO TABS: 40.0000 mg | ORAL_TABLET | Freq: Every day | ORAL | 0 refills | Status: DC

## 2020-12-24 NOTE — Progress Notes (Signed)
@Patient  ID: [name], male    DOB: [birth year], 65 y.o.   MRN: [id number]  Chief Complaint  Patient presents with   Joint Pain     Referring provider: Caryl Never*  HPI  Patient presents today for multiple joint pain.  He states for the past few weeks he has been having increased multiple joint pain, decreased range of motion, stiffness.  He states that this happens every year at this time.  He states that usually around the prednisone will help relieve the symptoms.  Patient has had extensive work-up through the [place] and states that work-up did not show rheumatoid arthritis. Denies f/c/s, n/v/d, hemoptysis, PND, chest pain or edema.    No Known Allergies  Immunization History  Administered Date(s) Administered   PFIZER(Purple Top)SARS-COV-2 Vaccination 05/17/2019, 06/07/2019    Past Medical History:  Diagnosis Date   Agatston coronary artery calcium score between 200 and 399 10/2020   Coronary CTA showed increased coronary Ca at 237 with minimal CAD in the LCx and RCA.   Erectile dysfunction    Essential hypertension 11/30/2019   Gout    Infected prepatellar bursa, right    Moderate episode of recurrent major depressive disorder ([place]) 08/26/2020   Polyarthritis    Pre-diabetes     Tobacco History: Social History   Tobacco Use  Smoking Status Never  Smokeless Tobacco Never   Counseling given: Yes   Outpatient Encounter Medications as of 12/24/2020  Medication Sig   acetaminophen (TYLENOL) 500 MG tablet Take 2 tablets (1,000 mg total) by mouth every 6 (six) hours as needed for mild pain.   aspirin EC 81 MG tablet Take 1 tablet (81 mg total) by mouth daily. Swallow whole.   atorvastatin (LIPITOR) 10 MG tablet Take 1 tablet (10 mg total) by mouth daily.   DULoxetine (CYMBALTA) 30 MG capsule Take 1 capsule (30 mg total) by mouth daily.   ibuprofen (ADVIL) 600 MG tablet Take 1 tablet (600 mg total) by mouth every 8 (eight) hours as needed for moderate  pain.   lisinopril (ZESTRIL) 10 MG tablet Take 1 tablet by mouth once daily   loratadine (CLARITIN) 10 MG tablet Take 1 tablet (10 mg total) by mouth daily.   metoprolol tartrate (LOPRESSOR) 100 MG tablet Take 1 tablet (100 mg total) by mouth once for 1 dose. Take 1 tablet two hours prior to CT scan   predniSONE (DELTASONE) 20 MG tablet Take 2 tablets (40 mg total) by mouth daily with breakfast.   Saw Palmetto, Serenoa repens, (SAW PALMETTO PO) Take 1 capsule by mouth daily.   tiZANidine (ZANAFLEX) 4 MG tablet Take 0.5-1 tablets (2-4 mg total) by mouth every 6 (six) hours as needed for muscle spasms. Do not drink alcohol or drive while taking this medication.  May cause drowsiness.   Vitamin D, Ergocalciferol, (DRISDOL) 1.25 MG (50000 UNIT) CAPS capsule Take 1 capsule (50,000 Units total) by mouth every 7 (seven) days.   [DISCONTINUED] predniSONE (DELTASONE) 20 MG tablet Take 2 tablets (40 mg total) by mouth daily with breakfast.   No facility-administered encounter medications on file as of 12/24/2020.     Review of Systems  Review of Systems  Constitutional: Negative.   HENT: Negative.    Cardiovascular: Negative.   Gastrointestinal: Negative.   Musculoskeletal:  Positive for arthralgias.  Allergic/Immunologic: Negative.   Neurological: Negative.   Psychiatric/Behavioral: Negative.        Physical Exam  BP (!) 145/84   Pulse 78   Resp  18   SpO2 97%   Wt Readings from Last 5 Encounters:  10/28/20 197 lb (89.4 kg)  08/26/20 200 lb (90.7 kg)  04/29/20 192 lb 6.4 oz (87.3 kg)  04/26/20 196 lb (88.9 kg)  04/17/20 194 lb 6.4 oz (88.2 kg)     Physical Exam Vitals and nursing note reviewed.  Constitutional:      General: He is not in acute distress.    Appearance: He is well-developed.  Cardiovascular:     Rate and Rhythm: Normal rate and regular rhythm.  Pulmonary:     Effort: Pulmonary effort is normal.     Breath sounds: Normal breath sounds.  Skin:    General:  Skin is warm and dry.  Neurological:     Mental Status: He is alert and oriented to person, place, and time.     Lab Results:  CBC    Component Value Date/Time   WBC 8.9 03/14/2020 1634   WBC 8.9 12/22/2019 1457   RBC 4.48 03/14/2020 1634   RBC 4.10 (L) 12/22/2019 1457   HGB 13.6 03/14/2020 1634   HCT 39.8 03/14/2020 1634   PLT 372 03/14/2020 1634   MCV 89 03/14/2020 1634   MCH 30.4 03/14/2020 1634   MCH 29.8 12/22/2019 1457   MCHC 34.2 03/14/2020 1634   MCHC 32.0 12/22/2019 1457   RDW 13.5 03/14/2020 1634   LYMPHSABS 1.0 01/17/2020 1422   MONOABS 0.9 12/28/2018 0101   EOSABS 0.0 01/17/2020 1422   BASOSABS 0.0 01/17/2020 1422    BMET    Component Value Date/Time   NA 139 10/28/2020 1442   K 4.8 10/28/2020 1442   CL 103 10/28/2020 1442   CO2 24 10/28/2020 1442   GLUCOSE 112 (H) 10/28/2020 1442   GLUCOSE 169 (H) 12/22/2019 1457   BUN 9 10/28/2020 1442   CREATININE 1.01 10/28/2020 1442   CALCIUM 10.0 10/28/2020 1442   GFRNONAA 87 03/14/2020 1634   GFRNONAA >60 12/22/2019 1457   GFRAA 101 03/14/2020 1634     Assessment & Plan:   Chronic pain of multiple joints Multiple Joint Pain:  Will order prednisone  Stay active  May try gentle stretching   Follow up:  Follow up if needed     Fenton Foy, NP 12/27/2020

## 2020-12-24 NOTE — Patient Instructions (Addendum)
Multiple Joint Pain:  Will order prednisone  Stay active  May try gentle stretching   Follow up:  Follow up if needed

## 2020-12-27 ENCOUNTER — Encounter: Payer: Self-pay | Admitting: Nurse Practitioner

## 2020-12-27 DIAGNOSIS — G8929 Other chronic pain: Secondary | ICD-10-CM | POA: Insufficient documentation

## 2020-12-27 NOTE — Assessment & Plan Note (Signed)
Multiple Joint Pain:  Will order prednisone  Stay active  May try gentle stretching   Follow up:  Follow up if needed

## 2020-12-31 ENCOUNTER — Telehealth: Payer: Self-pay | Admitting: Family Medicine

## 2020-12-31 NOTE — Telephone Encounter (Signed)
Patient has not been seen by [facility]. This concern should be routed to his PCP Dr. Redmond Pulling.

## 2020-12-31 NOTE — Telephone Encounter (Signed)
Pt came in to [facility] and received only 8 tablets. Pt is asking if he could obtain a month supply for predniSONE (DELTASONE) 20 MG tablet [[id number]]   Pharmacy  [facility] [place] [facility] [place]  [address] [place] ([place]) [place] [address]  Phone:  [phone]  Fax:  [phone]   Pt states he really needs more than 8. Please advise and thank you.

## 2021-01-19 ENCOUNTER — Other Ambulatory Visit: Payer: Self-pay | Admitting: Family

## 2021-02-11 ENCOUNTER — Telehealth: Payer: Self-pay | Admitting: Family Medicine

## 2021-02-11 NOTE — Telephone Encounter (Signed)
Pt says he had a prescription for 8 tablets for Prednisone from [facility] and is asking why so little amount and why not more. Pt states he really has a Hard time in the winter time and Dr. Carrolyn Meiers has prescribed Prednisone in the past and that always helps him so patient is asking for a refill and of more than 8 tablets please. Pt states he'd like to change Pharmacy to [facility] on [address] due to proximity vs [address].    predniSONE (DELTASONE) 20 MG tablet [[id number]]   lisinopril (ZESTRIL) 10 MG tablet [place] ([place]), [place]  [address], [place] ([place]) [place] [address]

## 2021-02-11 NOTE — Telephone Encounter (Signed)
Patient ws called to discuss medication concern. Patient had a full mailbox and I was not able to leave msg.

## 2021-03-11 ENCOUNTER — Ambulatory Visit: Payer: Medicare HMO | Admitting: Physician Assistant

## 2021-03-11 ENCOUNTER — Other Ambulatory Visit: Payer: Self-pay

## 2021-03-11 VITALS — BP 148/84 | HR 84 | Temp 98.2°F | Resp 18 | Ht 69.0 in | Wt 203.0 lb

## 2021-03-11 DIAGNOSIS — Z1211 Encounter for screening for malignant neoplasm of colon: Secondary | ICD-10-CM

## 2021-03-11 DIAGNOSIS — M130 Polyarthritis, unspecified: Secondary | ICD-10-CM | POA: Diagnosis not present

## 2021-03-11 DIAGNOSIS — R972 Elevated prostate specific antigen [PSA]: Secondary | ICD-10-CM | POA: Diagnosis not present

## 2021-03-11 DIAGNOSIS — E119 Type 2 diabetes mellitus without complications: Secondary | ICD-10-CM | POA: Diagnosis not present

## 2021-03-11 DIAGNOSIS — I10 Essential (primary) hypertension: Secondary | ICD-10-CM | POA: Diagnosis not present

## 2021-03-11 DIAGNOSIS — Z6829 Body mass index (BMI) 29.0-29.9, adult: Secondary | ICD-10-CM

## 2021-03-11 DIAGNOSIS — E559 Vitamin D deficiency, unspecified: Secondary | ICD-10-CM

## 2021-03-11 MED ORDER — LISINOPRIL 10 MG PO TABS: 10.0000 mg | ORAL_TABLET | Freq: Every day | ORAL | 0 refills | Status: DC

## 2021-03-11 MED ORDER — PREDNISONE 20 MG PO TABS: 20.0000 mg | ORAL_TABLET | Freq: Every day | ORAL | 0 refills | Status: AC

## 2021-03-11 MED ORDER — ACCU-CHEK SOFTCLIX LANCETS MISC: 12 refills | Status: AC

## 2021-03-11 MED ORDER — ACCU-CHEK AVIVA PLUS W/DEVICE KIT: 1.0000 [IU] | PACK | Freq: Once | 0 refills | Status: AC

## 2021-03-11 MED ORDER — DULOXETINE HCL 30 MG PO CPEP: 30.0000 mg | ORAL_CAPSULE | Freq: Every day | ORAL | 1 refills | Status: AC

## 2021-03-11 MED ORDER — METFORMIN HCL ER 500 MG PO TB24: 500.0000 mg | ORAL_TABLET | Freq: Every day | ORAL | 1 refills | Status: AC

## 2021-03-11 MED ORDER — ACCU-CHEK AVIVA PLUS VI STRP: ORAL_STRIP | 12 refills | Status: AC

## 2021-03-11 NOTE — Patient Instructions (Signed)
To help control your blood sugars, you are going to start taking metformin 500 mg once a day with breakfast.  It is important that you start checking your blood sugar levels daily, keep a written log and have available for all office visits.  It is important that you start following a low sugar diet as well.  To help with your joint pain, you will have 20 tablets available of prednisone 20 mg.  I strongly encourage you to start taking Cymbalta on a daily basis, this should start offering you some relief in approximately 3 to 4 weeks.  I have also started a referral for you to be seen by orthopedics for further evaluation, it is very important that you follow-up with that appointment.  We will call you with today's lab results.  Austin Rad, PA-C Physician Assistant [facility] [email or web address]   Diabetes Mellitus Basics Diabetes mellitus, or diabetes, is a long-term (chronic) disease. It occurs when the body does not properly use sugar (glucose) that is released from food after you eat. Diabetes mellitus may be caused by one or both of these problems: Your pancreas does not make enough of a hormone called insulin. Your body does not react in a normal way to the insulin that it makes. Insulin lets glucose enter cells in your body. This gives you energy. If you have diabetes, glucose cannot get into cells. This causes high blood glucose (hyperglycemia). How to treat and manage diabetes You may need to take insulin or other diabetes medicines daily to keep your glucose in balance. If you are prescribed insulin, you will learn how to give yourself insulin by injection. You may need to adjust the amount of insulin you take based on the foods that you eat. You will need to check your blood glucose levels using a glucose monitor as told by your health care provider. The readings can help determine if you have low or high blood  glucose. Generally, you should have these blood glucose levels: Before meals (preprandial): 80-130 mg/dL (4.4-7.2 mmol/L). After meals (postprandial): below 180 mg/dL (10 mmol/L). Hemoglobin A1c (HbA1c) level: less than 7%. Your health care provider will set treatment goals for you. Keep all follow-up visits. This is important. Follow these instructions at home: Diabetes medicines Take your diabetes medicines every day as told by your health care provider. List your diabetes medicines here: Name of medicine: ______________________________ Amount (dose): _______________ Time (a.m./p.m.): _______________ Notes: ___________________________________ Name of medicine: ______________________________ Amount (dose): _______________ Time (a.m./p.m.): _______________ Notes: ___________________________________ Name of medicine: ______________________________ Amount (dose): _______________ Time (a.m./p.m.): _______________ Notes: ___________________________________ Insulin If you use insulin, list the types of insulin you use here: Insulin type: ______________________________ Amount (dose): _______________ Time (a.m./p.m.): _______________Notes: ___________________________________ Insulin type: ______________________________ Amount (dose): _______________ Time (a.m./p.m.): _______________ Notes: ___________________________________ Insulin type: ______________________________ Amount (dose): _______________ Time (a.m./p.m.): _______________ Notes: ___________________________________ Insulin type: ______________________________ Amount (dose): _______________ Time (a.m./p.m.): _______________ Notes: ___________________________________ Insulin type: ______________________________ Amount (dose): _______________ Time (a.m./p.m.): _______________ Notes: ___________________________________ Managing blood glucose Check your blood glucose levels using a glucose monitor as told by your health care  provider. Write down the times that you check your glucose levels here: Time: _______________ Notes: ___________________________________ Time: _______________ Notes: ___________________________________ Time: _______________ Notes: ___________________________________ Time: _______________ Notes: ___________________________________ Time: _______________ Notes: ___________________________________ Time: _______________ Notes: ___________________________________  Low blood glucose Low blood glucose (hypoglycemia) is when glucose is at or below 70 mg/dL (3.9 mmol/L). Symptoms may include: Feeling: Hungry. Sweaty and clammy. Irritable or easily upset. Dizzy. Sleepy.  Having: A fast heartbeat. A headache. A change in your vision. Numbness around the mouth, lips, or tongue. Having trouble with: Moving (coordination). Sleeping. Treating low blood glucose To treat low blood glucose, eat or drink something containing sugar right away. If you can think clearly and swallow safely, follow the 15:15 rule: Take 15 grams of a fast-acting carb (carbohydrate), as told by your health care provider. Some fast-acting carbs are: Glucose tablets: take 3-4 tablets. Hard candy: eat 3-5 pieces. Fruit juice: drink 4 oz (120 mL). Regular (not diet) soda: drink 4-6 oz (120-180 mL). Honey or sugar: eat 1 Tbsp (15 mL). Check your blood glucose levels 15 minutes after you take the carb. If your glucose is still at or below 70 mg/dL (3.9 mmol/L), take 15 grams of a carb again. If your glucose does not go above 70 mg/dL (3.9 mmol/L) after 3 tries, get help right away. After your glucose goes back to normal, eat a meal or a snack within 1 hour. Treating very low blood glucose If your glucose is at or below 54 mg/dL (3 mmol/L), you have very low blood glucose (severe hypoglycemia). This is an emergency. Do not wait to see if the symptoms will go away. Get medical help right away. Call your local emergency services  (911 in the [place] Do not drive yourself to the hospital. Questions to ask your health care provider Should I talk with a diabetes educator? What equipment will I need to care for myself at home? What diabetes medicines do I need? When should I take them? How often do I need to check my blood glucose levels? What number can I call if I have questions? When is my follow-up visit? Where can I find a support group for people with diabetes? Where to find more information American Diabetes Association: www.diabetes.org Association of Diabetes Care and Education Specialists: www.diabeteseducator.org Contact a health care provider if: Your blood glucose is at or above 240 mg/dL (13.3 mmol/L) for 2 days in a row. You have been sick or have had a fever for 2 days or more, and you are not getting better. You have any of these problems for more than 6 hours: You cannot eat or drink. You feel nauseous. You vomit. You have diarrhea. Get help right away if: Your blood glucose is lower than 54 mg/dL (3 mmol/L). You get confused. You have trouble thinking clearly. You have trouble breathing. These symptoms may represent a serious problem that is an emergency. Do not wait to see if the symptoms will go away. Get medical help right away. Call your local emergency services (911 in the [place] Do not drive yourself to the hospital. Summary Diabetes mellitus is a chronic disease that occurs when the body does not properly use sugar (glucose) that is released from food after you eat. Take insulin and diabetes medicines as told. Check your blood glucose every day, as often as told. Keep all follow-up visits. This is important. This information is not intended to replace advice given to you by your health care provider. Make sure you discuss any questions you have with your health care provider. Document Revised: 06/20/2019 Document Reviewed: 06/20/2019 Elsevier Patient Education  [place].

## 2021-03-11 NOTE — Progress Notes (Signed)
Established Patient Office Visit  Subjective:  Patient ID: [name], male    DOB: 1955/12/15  Age: 66 y.o. MRN: [id number]  CC:  Chief Complaint  Patient presents with   Shoulder Pain    HPI [name] states that he has been having pain in both shoulders , states that this is chronic, worse in the cold weather, states that prednisone does offer relief.  States that he has been out of his lisinopril for the past 3 weeks.  Has not been checking his blood pressure at home.  States that he did complete the course of vitamin D, 12 weeks once weekly.  States that he did not start the Cymbalta, states that he heard it would give him a stomachache.     Past Medical History:  Diagnosis Date   Agatston coronary artery calcium score between 200 and 399 10/2020   Coronary CTA showed increased coronary Ca at 237 with minimal CAD in the LCx and RCA.   Erectile dysfunction    Essential hypertension 11/30/2019   Gout    Infected prepatellar bursa, right    Moderate episode of recurrent major depressive disorder ([place]) 08/26/2020   Polyarthritis    Pre-diabetes     Past Surgical History:  Procedure Laterality Date   CHOLECYSTECTOMY     EXCISION OF BACK LESION Left 05/02/2020   Procedure: EXCISION OF BACK LESION, left lower back cyst;  Surgeon: Olean Ree, MD;  Location: [facility] ORS;  Service: General;  Laterality: Left;   IRRIGATION AND DEBRIDEMENT KNEE Right 12/28/2018   Procedure: IRRIGATION AND DEBRIDEMENT KNEE;  Surgeon: Mcarthur Rossetti, MD;  Location: [facility];  Service: Orthopedics;  Laterality: Right;   KNEE SURGERY     ROTATOR CUFF REPAIR     THUMB ARTHROSCOPY Right 2007    Family History  Problem Relation Age of Onset   Breast cancer Mother    Prostate cancer Father    Hypertension Father    Coronary artery disease Brother 38       MIs with CABG   Coronary artery disease Brother 29       MIs with CABG   Coronary artery disease Brother 74       MIs    Lung cancer Son     Social History   Socioeconomic History   Marital status: Married    Spouse name: Not on file   Number of children: Not on file   Years of education: Not on file   Highest education level: Not on file  Occupational History   Not on file  Tobacco Use   Smoking status: Never   Smokeless tobacco: Never  Vaping Use   Vaping Use: Never used  Substance and Sexual Activity   Alcohol use: No    Alcohol/week: 0.0 standard drinks   Drug use: No   Sexual activity: Not Currently  Other Topics Concern   Not on file  Social History Narrative   Not on file   Social Determinants of Health   Financial Resource Strain: Not on file  Food Insecurity: Not on file  Transportation Needs: Not on file  Physical Activity: Not on file  Stress: Not on file  Social Connections: Not on file  Intimate Partner Violence: Not on file    Outpatient Medications Prior to Visit  Medication Sig Dispense Refill   acetaminophen (TYLENOL) 500 MG tablet Take 2 tablets (1,000 mg total) by mouth every 6 (six) hours as needed for mild pain.  aspirin EC 81 MG tablet Take 1 tablet (81 mg total) by mouth daily. Swallow whole. 90 tablet 3   ibuprofen (ADVIL) 600 MG tablet Take 1 tablet (600 mg total) by mouth every 8 (eight) hours as needed for moderate pain. 60 tablet 1   lisinopril (ZESTRIL) 10 MG tablet Take 1 tablet by mouth once daily 30 tablet 0   atorvastatin (LIPITOR) 10 MG tablet Take 1 tablet (10 mg total) by mouth daily. (Patient not taking: Reported on 03/11/2021) 90 tablet 3   loratadine (CLARITIN) 10 MG tablet Take 1 tablet (10 mg total) by mouth daily. (Patient not taking: Reported on 03/11/2021) 15 tablet 0   metoprolol tartrate (LOPRESSOR) 100 MG tablet Take 1 tablet (100 mg total) by mouth once for 1 dose. Take 1 tablet two hours prior to CT scan (Patient not taking: Reported on 03/11/2021) 1 tablet 0   Saw Palmetto, Serenoa repens, (SAW PALMETTO PO) Take 1 capsule by mouth daily.  (Patient not taking: Reported on 03/11/2021)     tiZANidine (ZANAFLEX) 4 MG tablet Take 0.5-1 tablets (2-4 mg total) by mouth every 6 (six) hours as needed for muscle spasms. Do not drink alcohol or drive while taking this medication.  May cause drowsiness. (Patient not taking: Reported on 03/11/2021) 15 tablet 0   Vitamin D, Ergocalciferol, (DRISDOL) 1.25 MG (50000 UNIT) CAPS capsule Take 1 capsule (50,000 Units total) by mouth every 7 (seven) days. (Patient not taking: Reported on 03/11/2021) 4 capsule 2   DULoxetine (CYMBALTA) 30 MG capsule Take 1 capsule (30 mg total) by mouth daily. (Patient not taking: Reported on 03/11/2021) 30 capsule 1   predniSONE (DELTASONE) 20 MG tablet Take 2 tablets (40 mg total) by mouth daily with breakfast. 8 tablet 0   No facility-administered medications prior to visit.    No Known Allergies  ROS Review of Systems  Constitutional: Negative.   HENT: Negative.    Eyes: Negative.   Respiratory:  Negative for shortness of breath.   Cardiovascular:  Negative for chest pain.  Gastrointestinal: Negative.   Endocrine: Negative.   Genitourinary: Negative.   Musculoskeletal:  Positive for arthralgias.  Skin: Negative.   Allergic/Immunologic: Negative.   Neurological: Negative.   Hematological: Negative.   Psychiatric/Behavioral:  Positive for dysphoric mood. Negative for self-injury, sleep disturbance and suicidal ideas. The patient is not nervous/anxious.      Objective:    Physical Exam Vitals and nursing note reviewed.  Constitutional:      Appearance: Normal appearance.  HENT:     Head: Normocephalic and atraumatic.     Right Ear: External ear normal.     Left Ear: External ear normal.     Nose: Nose normal.     Mouth/Throat:     Mouth: Mucous membranes are moist.     Pharynx: Oropharynx is clear.  Eyes:     Extraocular Movements: Extraocular movements intact.     Conjunctiva/sclera: Conjunctivae normal.     Pupils: Pupils are equal, round, and  reactive to light.  Cardiovascular:     Rate and Rhythm: Normal rate and regular rhythm.     Pulses: Normal pulses.     Heart sounds: Normal heart sounds.  Pulmonary:     Effort: Pulmonary effort is normal.     Breath sounds: Normal breath sounds.  Musculoskeletal:     Right shoulder: Bony tenderness and crepitus present. No swelling. Decreased range of motion.     Left shoulder: Bony tenderness and crepitus present. No swelling.  Decreased range of motion.     Right elbow: No swelling. Decreased range of motion.     Left elbow: No swelling. Decreased range of motion.     Cervical back: Normal range of motion and neck supple.     Right knee: Bony tenderness and crepitus present. No swelling. Decreased range of motion.     Left knee: Bony tenderness and crepitus present. No swelling. Decreased range of motion.  Skin:    General: Skin is warm and dry.  Neurological:     General: No focal deficit present.     Mental Status: He is alert and oriented to person, place, and time.  Psychiatric:        Mood and Affect: Mood normal.        Behavior: Behavior normal.        Thought Content: Thought content normal.        Judgment: Judgment normal.    BP (!) 148/84 (BP Location: Left Arm, Patient Position: Sitting, Cuff Size: Large)    Pulse 84    Temp 98.2 F (36.8 C) (Oral)    Resp 18    Ht '5\' 9"'  (1.753 m)    Wt 203 lb (92.1 kg)    SpO2 100%    BMI 29.98 kg/m  Wt Readings from Last 3 Encounters:  03/11/21 203 lb (92.1 kg)  10/28/20 197 lb (89.4 kg)  08/26/20 200 lb (90.7 kg)      Health Maintenance Due  Topic Date Due   Pneumonia Vaccine 48+ Years old (1 - PCV) Never done   TETANUS/TDAP  Never done   COLON CANCER SCREENING ANNUAL FOBT  Never done   COLONOSCOPY (Pts 45-51yr Insurance coverage will need to be confirmed)  Never done   Zoster Vaccines- Shingrix (1 of 2) Never done   COVID-19 Vaccine (3 - Booster for [place]) 08/02/2019   INFLUENZA VACCINE  Never done     There are no preventive care reminders to display for this patient.  Lab Results  Component Value Date   TSH 0.779 12/12/2019   Lab Results  Component Value Date   WBC 8.9 03/14/2020   HGB 13.6 03/14/2020   HCT 39.8 03/14/2020   MCV 89 03/14/2020   PLT 372 03/14/2020   Lab Results  Component Value Date   NA 139 10/28/2020   K 4.8 10/28/2020   CO2 24 10/28/2020   GLUCOSE 112 (H) 10/28/2020   BUN 9 10/28/2020   CREATININE 1.01 10/28/2020   BILITOT 0.7 11/30/2019   ALKPHOS 119 11/30/2019   AST 16 11/30/2019   ALT 16 11/18/2020   PROT 7.5 11/30/2019   ALBUMIN 4.5 11/30/2019   CALCIUM 10.0 10/28/2020   ANIONGAP 10 12/22/2019   EGFR 83 10/28/2020   Lab Results  Component Value Date   CHOL 187 11/18/2020   Lab Results  Component Value Date   HDL 62 11/18/2020   Lab Results  Component Value Date   LDLCALC 103 (H) 11/18/2020   Lab Results  Component Value Date   TRIG 125 11/18/2020   Lab Results  Component Value Date   CHOLHDL 3.0 11/18/2020   Lab Results  Component Value Date   HGBA1C 6.8 (H) 01/17/2020      Assessment & Plan:   Problem List Items Addressed This Visit       Cardiovascular and Mediastinum   Essential hypertension   Relevant Medications   lisinopril (ZESTRIL) 10 MG tablet     Musculoskeletal and Integument  Polyarthritis - Primary   Relevant Medications   predniSONE (DELTASONE) 20 MG tablet   DULoxetine (CYMBALTA) 30 MG capsule   Other Relevant Orders   Ambulatory referral to Orthopedic Surgery     Other   Prediabetes   Relevant Medications   metFORMIN (GLUCOPHAGE XR) 500 MG 24 hr tablet   Blood Glucose Monitoring Suppl (ACCU-CHEK AVIVA PLUS) w/Device KIT   Accu-Chek Softclix Lancets lancets   glucose blood (ACCU-CHEK AVIVA PLUS) test strip   Elevated PSA, less than 10 ng/ml   Relevant Orders   PSA   Vitamin D deficiency   Relevant Orders   Vitamin D, 25-hydroxy   Other Visit Diagnoses     Special screening for  malignant neoplasms, colon       Relevant Orders   Ambulatory referral to Gastroenterology   BMI 29.0-29.9,adult           Meds ordered this encounter  Medications   lisinopril (ZESTRIL) 10 MG tablet    Sig: Take 1 tablet (10 mg total) by mouth daily.    Dispense:  30 tablet    Refill:  0    Order Specific Question:   Supervising Provider    Answer:   Asencion Noble E [[id number]]   predniSONE (DELTASONE) 20 MG tablet    Sig: Take 1 tablet (20 mg total) by mouth daily with breakfast for 20 days.    Dispense:  20 tablet    Refill:  0    Order Specific Question:   Supervising Provider    Answer:   Noralyn Pick   metFORMIN (GLUCOPHAGE XR) 500 MG 24 hr tablet    Sig: Take 1 tablet (500 mg total) by mouth daily with breakfast.    Dispense:  30 tablet    Refill:  1    Order Specific Question:   Supervising Provider    Answer:   Asencion Noble E [[id number]]   DULoxetine (CYMBALTA) 30 MG capsule    Sig: Take 1 capsule (30 mg total) by mouth daily.    Dispense:  30 capsule    Refill:  1    Order Specific Question:   Supervising Provider    Answer:   Asencion Noble E [[id number]]   Blood Glucose Monitoring Suppl (ACCU-CHEK AVIVA PLUS) w/Device KIT    Sig: 1 Units by Does not apply route once for 1 dose.    Dispense:  1 kit    Refill:  0    Order Specific Question:   Supervising Provider    Answer:   Joya Gaskins, PATRICK E [[id number]]   Accu-Chek Softclix Lancets lancets    Sig: Use as instructed    Dispense:  100 each    Refill:  12    Order Specific Question:   Supervising Provider    Answer:   Asencion Noble E [[id number]]   glucose blood (ACCU-CHEK AVIVA PLUS) test strip    Sig: Use as instructed    Dispense:  100 each    Refill:  12    Order Specific Question:   Supervising Provider    Answer:   Asencion Noble E [[id number]]  1. Polyarthritis Once again strongly encourage patient to do trial of Cymbalta.  Patient has previously agreed to trial, once again does agree.  Once again explained to  patient that he would not be able to continue to be prescribed prednisone and due to potential adverse effects.  Patient has upcoming appointment to establish care [facility] with Dr.  Wilson on March 26, 2021, strongly encourage patient to keep appointment.   - Ambulatory referral to Orthopedic Surgery - predniSONE (DELTASONE) 20 MG tablet; Take 1 tablet (20 mg total) by mouth daily with breakfast for 20 days.  Dispense: 20 tablet; Refill: 0 - DULoxetine (CYMBALTA) 30 MG capsule; Take 1 capsule (30 mg total) by mouth daily.  Dispense: 30 capsule; Refill: 1  2. Essential hypertension Resume lisinopril.  Encourage patient to check blood pressure at home on a daily basis, keep a written log and have available for all office visits - lisinopril (ZESTRIL) 10 MG tablet; Take 1 tablet (10 mg total) by mouth daily.  Dispense: 30 tablet; Refill: 0  3. Type 2 diabetes mellitus without complication, without long-term current use of insulin (HCC) A1c 6.5.  Patient has previously had diagnosis of type 2 diabetes, and was prescribed metformin which she did not start.  Patient agreeable to start metformin at this time.  Patient education given on supportive care, low sugar diet.  Patient encouraged to check blood glucose levels at home on a daily basis, keep a written log and have available for all office visits - metFORMIN (GLUCOPHAGE XR) 500 MG 24 hr tablet; Take 1 tablet (500 mg total) by mouth daily with breakfast.  Dispense: 30 tablet; Refill: 1 - Blood Glucose Monitoring Suppl (ACCU-CHEK AVIVA PLUS) w/Device KIT; 1 Units by Does not apply route once for 1 dose.  Dispense: 1 kit; Refill: 0 - Accu-Chek Softclix Lancets lancets; Use as instructed  Dispense: 100 each; Refill: 12 - glucose blood (ACCU-CHEK AVIVA PLUS) test strip; Use as instructed  Dispense: 100 each; Refill: 12  4. Elevated PSA, less than 10 ng/ml Previous elevation, recheck today, patient has previously been referred to  urology for further evaluation, patient stated he was unable to attend to those referrals due to his insurance - PSA  5. Vitamin D deficiency  - Vitamin D, 25-hydroxy  6. Special screening for malignant neoplasms, colon Patient has previously been referred for colon cancer screening several times, states he was unable to attend to those referrals due to insurance - Ambulatory referral to Gastroenterology    I have reviewed the patient's medical history (PMH, PSH, Social History, Family History, Medications, and allergies) , and have been updated if relevant. I spent 40 minutes reviewing chart and  face to face time with patient.    Follow-up: Return in about 15 days (around 03/26/2021) for with Dr. Redmond Pulling at [facility].    Loraine Grip Mayers, PA-C

## 2021-03-11 NOTE — Progress Notes (Signed)
Patient has been out of BP medication for 3 weeks.Patient has not eaten today. Patient reports pain in both shoulders and left hip

## 2021-03-12 LAB — VITAMIN D 25 HYDROXY (VIT D DEFICIENCY, FRACTURES): Vit D, 25-Hydroxy: 11.9 ng/mL — ABNORMAL LOW (ref 30.0–100.0)

## 2021-03-12 LAB — PSA: Prostate Specific Ag, Serum: 7.1 ng/mL — ABNORMAL HIGH (ref 0.0–4.0)

## 2021-03-12 MED ORDER — VITAMIN D (ERGOCALCIFEROL) 1.25 MG (50000 UNIT) PO CAPS: 50000.0000 [IU] | ORAL_CAPSULE | ORAL | 2 refills | Status: DC

## 2021-03-12 NOTE — Addendum Note (Signed)
Addended by: Kennieth Rad on: 03/12/2021 01:42 PM   Modules accepted: Orders

## 2021-03-18 ENCOUNTER — Telehealth: Payer: Self-pay | Admitting: *Deleted

## 2021-03-18 NOTE — Telephone Encounter (Signed)
-----   Message from Kennieth Rad, [place] sent at 03/12/2021  1:41 PM EST ----- Please call patient and let him know that his vitamin D levels continue to be low, he does need to do another course of 12 weeks of 50,000 units once a week.  Prescription sent to his pharmacy.  His prostate level continues to be elevated.  It is very important that he follow-up with urology.  Referral restarted.

## 2021-03-18 NOTE — Telephone Encounter (Signed)
Medical Assistant left message on patient's home and cell voicemail. Voicemail states to give a call back to Aurthur Wingerter with [facility] at [phone]. 

## 2021-03-19 ENCOUNTER — Telehealth: Payer: Self-pay | Admitting: *Deleted

## 2021-03-19 NOTE — Telephone Encounter (Signed)
Patient verified DOB Patient is aware of results and needing to begin weekly vitamin d along with keeping urology appointment to address elevated PSA levels. Patient was provided the phone number for [facility] and reminded of his upcoming appointments on tomorrow and the 25th.

## 2021-03-20 ENCOUNTER — Encounter: Payer: Self-pay | Admitting: Physician Assistant

## 2021-03-20 ENCOUNTER — Ambulatory Visit: Payer: Self-pay

## 2021-03-20 ENCOUNTER — Ambulatory Visit (INDEPENDENT_AMBULATORY_CARE_PROVIDER_SITE_OTHER): Payer: Medicare HMO

## 2021-03-20 ENCOUNTER — Ambulatory Visit (INDEPENDENT_AMBULATORY_CARE_PROVIDER_SITE_OTHER): Payer: Medicare HMO | Admitting: Physician Assistant

## 2021-03-20 DIAGNOSIS — M25512 Pain in left shoulder: Secondary | ICD-10-CM | POA: Diagnosis not present

## 2021-03-20 DIAGNOSIS — G8929 Other chronic pain: Secondary | ICD-10-CM

## 2021-03-20 DIAGNOSIS — M2550 Pain in unspecified joint: Secondary | ICD-10-CM

## 2021-03-20 DIAGNOSIS — M25511 Pain in right shoulder: Secondary | ICD-10-CM

## 2021-03-20 MED ORDER — LIDOCAINE HCL 1 % IJ SOLN
3.0000 mL | INTRAMUSCULAR | Status: AC | PRN
  Administered 2021-03-20: 3 mL

## 2021-03-20 MED ORDER — METHYLPREDNISOLONE ACETATE 40 MG/ML IJ SUSP
40.0000 mg | INTRAMUSCULAR | Status: AC | PRN
  Administered 2021-03-20: 40 mg via INTRA_ARTICULAR

## 2021-03-20 NOTE — Addendum Note (Signed)
Addended by: Robyne Peers on: 03/20/2021 04:06 PM   Modules accepted: Orders

## 2021-03-20 NOTE — Progress Notes (Addendum)
Office Visit Note   Patient: [name]           Date of Birth: 1955-12-12           MRN: [id number] Visit Date: 03/20/2021              Requested by: Mayers, Loraine Grip, PA-C [id number],  [place] [address] PCP: Dorna Mai, MD   Assessment & Plan: Visit Diagnoses:  1. Chronic right shoulder pain   2. Chronic left shoulder pain   3. Polyarthralgia     Plan: Given patient's multiple arthralgias we will send him to rheumatology as see if they could shed some light on his bilateral shoulder pain hand pain and stiffness bilateral hip pain.  He is taking periodic prednisone 20 mg when he has flares and states this helps.  However its been 2 weeks since he last had any prednisone.  Therefore it was felt that he could benefit from a right shoulder injection.  Subacromial injection was given without incident.  He is he will monitor his glucose levels closely over the next 24 to 48 hours. We will send him for formal therapy for both shoulders to work on range of motion and strengthening.  See him back in 1 month to see how he is doing overall.  Follow-Up Instructions: Return in about 4 weeks (around 04/17/2021).   Orders:  Orders Placed This Encounter  Procedures   Large Joint Inj: R subacromial bursa   XR Shoulder Right   XR Shoulder Left   Meds ordered this encounter  Medications   lidocaine (XYLOCAINE) 1 % (with pres) injection 3 mL   methylPREDNISolone acetate (DEPO-MEDROL) injection 40 mg      Procedures: Large Joint Inj: R subacromial bursa on 03/20/2021 4:01 PM Indications: pain Details: 22 G 1.5 in needle, lateral approach  Arthrogram: No  Medications: 3 mL lidocaine 1 %; 40 mg methylPREDNISolone acetate 40 MG/ML Outcome: tolerated well, no immediate complications Procedure, treatment alternatives, risks and benefits explained, specific risks discussed. Consent was given by the patient. Immediately prior to procedure a time out was called to  verify the correct patient, procedure, equipment, support staff and site/side marked as required. Patient was prepped and draped in the usual sterile fashion.      Clinical Data: No additional findings.   Subjective: Chief Complaint  Patient presents with   Right Shoulder - Pain   Left Shoulder - Pain    HPI [name] is a 66 year old male comes in today with bilateral shoulder pain.  He states that he has flares of both shoulders, hands and hips every winter.  Sometimes both knees.  Right now both knees are doing well.  He has had rheumatoid labs drawn in the past and these of all been within normal limits.  He does have a history of gout however last labs that I am able to find it showed that his uric acid level was within normal limits. His main complaint today is bilateral shoulder pain he states that his right shoulder is worse than his left.  His right shoulder is 10 out of 10 pain at worst in his left shoulder 6-7 out of 10 pain at worst.  Has had no known injury to either shoulder.  He states prednisone does help please been told by his primary care provider that he should not take this due to long-term side effects.  He has never been seen by rheumatology.  He was found to  have low vitamin D level and is currently on replacement for low vitamin D.  He is also recently began taking Cymbalta. Denies any numbness tingling down either arm.  Has some neck pain but no limitation of range of motion neck.  Has limitation with overhead activity both shoulders.  He has been told he is prediabetic.  Review of Systems Denies any fevers or chills.  Objective: Vital Signs: There were no vitals taken for this visit.  Physical Exam General well-developed well-nourished male in no acute distress mood affect appropriate. Psych: Alert and oriented x3 Ortho Exam Bilateral shoulders down to 5 strength with external and internal rotation against resistance.  Empty can test is negative bilaterally.   Liftoff test negative bilaterally.  Active forward flexion bilateral shoulders 60 degrees able to come to about 140 degrees of overhead activity passively I can bring to 180 degrees creates quite a bit of discomfort.  No considerable crepitus left shoulder with passive range of motion.  Slight crepitus with range of motion of the right shoulder with internal and external rotation.  Specialty Comments:  No specialty comments available.  Imaging: XR Shoulder Left  Result Date: 03/20/2021 Left shoulder 3 views.  Left shoulder is well located.  No significant arthropathy.  Glenohumeral joint well-maintained.  No acute fractures.  XR Shoulder Right  Result Date: 03/20/2021 Right shoulder 3 views: Shows no acute fractures.  Downsloping acromion.  No significant arthropathy.  Shoulders well located.    PMFS History: Patient Active Problem List   Diagnosis Date Noted   Chronic pain of multiple joints 12/27/2020   Agatston coronary artery calcium score between 200 and 399 11/12/2020   CVD (cardiovascular disease) 08/27/2020   Moderate episode of recurrent major depressive disorder ([place]) 08/26/2020   Epidermoid cyst of skin of back    Grief reaction 02/09/2020   Influenza vaccine refused 01/17/2020   Hyperlipidemia 12/12/2019   Elevated PSA, less than 10 ng/ml 12/12/2019   Vitamin D deficiency 12/12/2019   Abnormal thyroid blood test 12/12/2019   Essential hypertension 11/30/2019   Subcutaneous nodule of back 11/30/2019   Prepatellar abscess 12/29/2018   Cellulitis of right knee 12/28/2018   Infected prepatellar bursa, right    Prediabetes 07/08/2016   Elevated blood pressure reading without diagnosis of hypertension 06/01/2016   Dental caries 05/21/2015   Thrombocytosis 04/24/2015   Erectile dysfunction 04/23/2015   Polyarthritis 03/08/2015   ROTATOR CUFF INJURY, LEFT SHOULDER 06/20/2009   Past Medical History:  Diagnosis Date   Agatston coronary artery calcium score between 200  and 399 10/2020   Coronary CTA showed increased coronary Ca at 237 with minimal CAD in the LCx and RCA.   Erectile dysfunction    Essential hypertension 11/30/2019   Gout    Infected prepatellar bursa, right    Moderate episode of recurrent major depressive disorder ([place]) 08/26/2020   Polyarthritis    Pre-diabetes     Family History  Problem Relation Age of Onset   Breast cancer Mother    Prostate cancer Father    Hypertension Father    Coronary artery disease Brother 91       MIs with CABG   Coronary artery disease Brother 31       MIs with CABG   Coronary artery disease Brother 44       MIs   Lung cancer Son     Past Surgical History:  Procedure Laterality Date   CHOLECYSTECTOMY     EXCISION OF BACK LESION Left  05/02/2020   Procedure: EXCISION OF BACK LESION, left lower back cyst;  Surgeon: Olean Ree, MD;  Location: [facility] ORS;  Service: General;  Laterality: Left;   IRRIGATION AND DEBRIDEMENT KNEE Right 12/28/2018   Procedure: IRRIGATION AND DEBRIDEMENT KNEE;  Surgeon: Mcarthur Rossetti, MD;  Location: [facility];  Service: Orthopedics;  Laterality: Right;   KNEE SURGERY     ROTATOR CUFF REPAIR     THUMB ARTHROSCOPY Right 2007   Social History   Occupational History   Not on file  Tobacco Use   Smoking status: Never   Smokeless tobacco: Never  Vaping Use   Vaping Use: Never used  Substance and Sexual Activity   Alcohol use: No    Alcohol/week: 0.0 standard drinks   Drug use: No   Sexual activity: Not Currently

## 2021-03-25 ENCOUNTER — Other Ambulatory Visit: Payer: Self-pay

## 2021-03-25 ENCOUNTER — Ambulatory Visit (INDEPENDENT_AMBULATORY_CARE_PROVIDER_SITE_OTHER): Payer: Medicare HMO | Admitting: Physical Therapy

## 2021-03-25 ENCOUNTER — Encounter: Payer: Self-pay | Admitting: Physical Therapy

## 2021-03-25 DIAGNOSIS — M25511 Pain in right shoulder: Secondary | ICD-10-CM | POA: Diagnosis not present

## 2021-03-25 DIAGNOSIS — M542 Cervicalgia: Secondary | ICD-10-CM | POA: Diagnosis not present

## 2021-03-25 DIAGNOSIS — M6281 Muscle weakness (generalized): Secondary | ICD-10-CM

## 2021-03-25 DIAGNOSIS — M25512 Pain in left shoulder: Secondary | ICD-10-CM

## 2021-03-25 DIAGNOSIS — G8929 Other chronic pain: Secondary | ICD-10-CM

## 2021-03-25 NOTE — Therapy (Signed)
[facility] [address] [place], [place], [address] Phone: [phone]   Fax:  [phone]  Physical Therapy Evaluation Referring diagnosis? M25.511 Treatment diagnosis? (if different than referring diagnosis) same as above What was this (referring dx) caused by? []  Surgery []  Fall [x]  Ongoing issue [x]  Arthritis []  Other: ____________  Laterality: []  Rt []  Lt [x]  Both  Check all possible CPT codes:  *CHOOSE 10 OR LESS*    []  97110 (Therapeutic Exercise)  []  92507 (SLP Treatment)  []  97112 (Neuro Re-ed)   []  92526 (Swallowing Treatment)   []  97116 (Gait Training)   []  [id number] (Cognitive Training, 1st 15 minutes) []  97140 (Manual Therapy)   []  97130 (Cognitive Training, each add'l 15 minutes)  []  97530 (Therapeutic Activities)  []  Other, List CPT Code ____________    []  78938 (Self Care)       [x]  All codes above (97110 - 97535)  [x]  97012 (Mechanical Traction)  [x]  97014 (E-stim Unattended)  []  97032 (E-stim manual)  [x]  97033 (Ionto)  [x]  97035 (Ultrasound)  []  10175 (Orthotic Fit) []  [id number] (Physical Performance Training) []  [id number] (Aquatic Therapy) []  97034 (Contrast Bath) []  97018 (Paraffin) []  97597 (Wound Care 1st 20 sq cm) []  97598 (Wound Care each add'l 20 sq cm) [x]  97016 (Vasopneumatic Device) []  [id number] (Orthotic Training) []  [phone] (Prosthetic Training)   Patient Details  Name: [name] MRN: [id number] Date of Birth: [birth year] Referring Provider (PT): Pete Pelt, [place]   Encounter Date: 03/25/2021   PT End of Session - 03/25/21 1122     Visit Number 1    Number of Visits 12    Date for PT Re-Evaluation 05/06/21    Authorization Type [employer]    PT Start Time [phone]    PT Stop Time 0932    PT Time Calculation (min) 45 min    Activity Tolerance Patient tolerated treatment well    Behavior During Therapy [facility] for tasks assessed/performed             Past Medical History:  Diagnosis Date   Agatston  coronary artery calcium score between 200 and 399 10/2020   Coronary CTA showed increased coronary Ca at 237 with minimal CAD in the LCx and RCA.   Erectile dysfunction    Essential hypertension 11/30/2019   Gout    Infected prepatellar bursa, right    Moderate episode of recurrent major depressive disorder ([place]) 08/26/2020   Polyarthritis    Pre-diabetes     Past Surgical History:  Procedure Laterality Date   CHOLECYSTECTOMY     EXCISION OF BACK LESION Left 05/02/2020   Procedure: EXCISION OF BACK LESION, left lower back cyst;  Surgeon: Olean Ree, MD;  Location: [facility];  Service: General;  Laterality: Left;   IRRIGATION AND DEBRIDEMENT KNEE Right 12/28/2018   Procedure: IRRIGATION AND DEBRIDEMENT KNEE;  Surgeon: Mcarthur Rossetti, MD;  Location: [facility];  Service: Orthopedics;  Laterality: Right;   KNEE SURGERY     ROTATOR CUFF REPAIR     THUMB ARTHROSCOPY Right 2007    There were no vitals filed for this visit.    Subjective Assessment - 03/25/21 1048     Subjective Pt. presents with inflammation and bilateral shoulder pain that has been present for 5 years. He states that the pain is most prominent during the months of October - March and is 10/10 at it's worst and 4-5/10 at rest. He describes the pain as dull and achey and that this pain radiates  down to his hands as well. Pt. reports numbness and tingling down both arms. He recieved a cortisone shot in his R shoulder and states that this did not help with the pain.    Pertinent History Achilles Tendon Surgery (October 2022), Previous L shoulder surgery (1970's), Prediabetes, Essential hypertension, Chronic pain of multiple joints, Moderate episode of recurrent major depressive disorder ([place]), CVD (cardiovascular disease)    Limitations House hold activities;Lifting    Diagnostic tests "Right shoulder 3 views: Shows no acute fractures, Downsloping acromion, No significant arthropathy, Shoulders well located." "Left  shoulder 3 views.  Left shoulder is well located.  No significant   arthropathy.  Glenohumeral joint well-maintained.  No acute fractures"    Patient Stated Goals Understand shoulder pain, Decrease pain    Currently in Pain? Yes    Pain Score 5     Pain Location Shoulder    Pain Orientation Left;Right    Pain Descriptors / Indicators Aching;Dull;Tingling;Shooting;Numbness;Pins and needles;Constant    Pain Type Chronic pain    Pain Radiating Towards Down arm to hands    Pain Onset More than a month ago    Pain Frequency Constant    Aggravating Factors  Pain worst when waking up in the morning, and with activities during the day    Pain Relieving Factors Rest, Prednisone helps slightly                [facility] PT Assessment - 03/25/21 0001       Assessment   Medical Diagnosis Chronic right shoulder pain, Chronic left shoulder pain    Referring Provider (PT) Pete Pelt, PA-C    Onset Date/Surgical Date --   5 years ago   Next MD Visit 03/26/2021    Prior Therapy Yes- Achilles Tendon repair   Another facility     Precautions   Precautions None      Restrictions   Weight Bearing Restrictions No      Balance Screen   Has the patient fallen in the past 6 months No      [place] residence      Prior Function   Level of Independence Independent    Vocation Other (comment)   Out of currently from work achilles tendon  injury     Cognition   Overall Cognitive Status Within Functional Limits for tasks assessed      Observation/Other Assessments   Focus on Therapeutic Outcomes (FOTO)  40%      ROM / Strength   AROM / PROM / Strength AROM;PROM;Strength      AROM   Overall AROM  Deficits    AROM Assessment Site Shoulder    Right/Left Shoulder Left;Right    Right Shoulder Extension --   WNL   Right Shoulder Flexion 110 Degrees    Right Shoulder ABduction 110 Degrees    Right Shoulder Internal Rotation --   WNL   Right Shoulder  External Rotation --   WNL   Left Shoulder Extension --   WNL   Left Shoulder Flexion 108 Degrees    Left Shoulder ABduction 100 Degrees    Left Shoulder Internal Rotation --   WNL   Left Shoulder External Rotation --   WNL     PROM   Overall PROM Comments All L/R PROM WNL, Except R Abd. 147 deg      Strength   Overall Strength Comments Prone Y's R/ L: 4/5    Strength Assessment Site  Shoulder    Right/Left Shoulder Right;Left    Right Shoulder Flexion 5/5    Right Shoulder ABduction 4/5    Right Shoulder Internal Rotation 4/5    Right Shoulder External Rotation 5/5    Right Shoulder Horizontal ABduction 4/5    Left Shoulder Flexion 5/5    Left Shoulder ABduction 5/5    Left Shoulder Internal Rotation 5/5    Left Shoulder External Rotation 5/5    Left Shoulder Horizontal ABduction 4/5      Palpation   Spinal mobility PAM's of C7-T4 hypomobility with concordant pain radiating to shoulders    Palpation comment Mild GH hypomobility of R>L with ant/ post/ inf. PAM's      Special Tests    Special Tests Cervical    Cervical Tests Spurling's      Spurling's   Findings Negative    Comment Both sides                        Objective measurements completed on examination: See above findings.       [facility] PT Treatment/Exercise - 03/25/21 0001       Modalities   Modalities Moist Heat      Moist Heat Therapy   Number Minutes Moist Heat 10 Minutes    Moist Heat Location Shoulder;Cervical                     PT Education - 03/25/21 1121     Education Details Pt. educated on HEP    Person(s) Educated Patient    Methods Explanation;Demonstration;Handout;Verbal cues    Comprehension Verbalized understanding;Need further instruction              PT Short Term Goals - 03/25/21 1136       PT SHORT TERM GOAL #1   Title Increase AROM R/L shoulder flexion 10 deg.    Baseline R- 110 L- 108 now    Time 4    Period Weeks    Status New     Target Date 04/22/21      PT SHORT TERM GOAL #2   Title Increase AROM R/L shoulder abduction 10 deg.    Baseline R- 110, L- 100 now    Time 4    Period Weeks    Status New    Target Date 04/22/21      PT SHORT TERM GOAL #3   Title Decrease morning pain 25%    Baseline 10/10 now    Time 4    Period Weeks    Status New    Target Date 04/22/21               PT Long Term Goals - 03/25/21 1140       PT LONG TERM GOAL #1   Title Pt will be I and compliant with HEP and verbalize plan for continued exercise post PT discharge    Time 6    Period Weeks    Status New    Target Date 05/06/21      PT LONG TERM GOAL #2   Title Increase FOTO score to 65%    Baseline 40%    Time 6    Period Weeks    Status New    Target Date 05/06/21      PT LONG TERM GOAL #3   Title Pt will reduce pain to overall less than 4-5/10 with usual activity and work activity.  Time 6    Period Weeks    Status New    Target Date 05/06/21      PT LONG TERM GOAL #4   Title Bilateral shoulder flexion and abduction WFL    Time 6    Period Weeks    Status New    Target Date 05/06/21                    Plan - 03/25/21 1123     Clinical Impression Statement Pt. presents with chronic bilateral decreased AROM in shoulder flexion and abduction limited by pain. Strength testing of both shoulders was strong, except for R shoulder abd, IR, and bilateral periscapular muscles. PROM was full and pain free in all motions except for R abduction which was limited. PAM's of the R shoulder showed mild hypomobility in comparison to the L shoulder, which contributes to lack of mobility. PAM's at the C7- T4 spinous processess, responded with concordant pain being referred to his shoulders. Spurling's test was negative on both sides. Skilled PT focusing on shoulder strenghtening, ROM and neck mobility is necessary.    Personal Factors and Comorbidities Comorbidity 3+;Time since onset of  injury/illness/exacerbation    Comorbidities Prediabetes, Essential hypertension, Chronic pain of multiple joints, Moderate episode of recurrent major depressive disorder ([place]), CVD (cardiovascular disease)    Examination-Activity Limitations Carry;Reach Overhead;Lift    Examination-Participation Restrictions Occupation;Cleaning;Community Activity    Stability/Clinical Decision Making Evolving/Moderate complexity    Clinical Decision Making Moderate    Rehab Potential Good    PT Frequency 2x / week    PT Duration 6 weeks    PT Treatment/Interventions ADLs/Self Care Home Management;Cryotherapy;Electrical Stimulation;Iontophoresis 4mg /ml Dexamethasone;Moist Heat;Ultrasound;Traction;Therapeutic activities;Therapeutic exercise;Balance training;Stair training;Gait training;Patient/family education;Manual techniques;Energy conservation;Passive range of motion;Dry needling;Vasopneumatic Device;Joint Manipulations;Spinal Manipulations    PT Next Visit Plan Reassess HEP, discuss with patient about doctor's appointment    PT Home Exercise Plan Access Code: [email or web address]    Consulted and Agree with Plan of Care Patient             Patient will benefit from skilled therapeutic intervention in order to improve the following deficits and impairments:  Decreased endurance, Decreased mobility, Hypomobility, Decreased range of motion, Decreased activity tolerance, Decreased strength, Impaired flexibility, Impaired UE functional use, Pain  Visit Diagnosis: Chronic left shoulder pain  Chronic right shoulder pain  Muscle weakness (generalized)  Cervicalgia     Problem List Patient Active Problem List   Diagnosis Date Noted   Chronic pain of multiple joints 12/27/2020   Agatston coronary artery calcium score between 200 and 399 11/12/2020   CVD (cardiovascular disease) 08/27/2020   Moderate episode of recurrent major depressive disorder ([place]) 08/26/2020   Epidermoid cyst of skin of back    Grief  reaction 02/09/2020   Influenza vaccine refused 01/17/2020   Hyperlipidemia 12/12/2019   Elevated PSA, less than 10 ng/ml 12/12/2019   Vitamin D deficiency 12/12/2019   Abnormal thyroid blood test 12/12/2019   Essential hypertension 11/30/2019   Subcutaneous nodule of back 11/30/2019   Prepatellar abscess 12/29/2018   Cellulitis of right knee 12/28/2018   Infected prepatellar bursa, right    Prediabetes 07/08/2016   Elevated blood pressure reading without diagnosis of hypertension 06/01/2016   Dental caries 05/21/2015   Thrombocytosis 04/24/2015   Erectile dysfunction 04/23/2015   Polyarthritis 03/08/2015   ROTATOR CUFF INJURY, LEFT SHOULDER 86/57/8469    [name], Student-PT 03/25/2021, 2:36 PM  [facility] [place],  [place], [address] Phone: [phone]   Fax:  [phone]  Name: [name] MRN: [id number] Date of Birth: 04/27/1955

## 2021-03-25 NOTE — Patient Instructions (Signed)
Access Code: [email or web address] URL: [email or web address] Date: 03/25/2021 Prepared by: Elsie Ra  Exercises Supine Shoulder Flexion Extension AAROM with Dowel - 2 x daily - 6 x weekly - 1-2 sets - 10 reps Supine Shoulder Abduction AAROM with Dowel - 2 x daily - 6 x weekly - 1-2 sets - 10 reps Shoulder Internal Rotation with Resistance - 2 x daily - 6 x weekly - 2-3 sets - 10 reps Shoulder External Rotation and Scapular Retraction with Resistance - 2 x daily - 6 x weekly - 2-3 sets - 10 reps Cervical Extension AROM with Strap - 2 x daily - 6 x weekly - 2 sets - 10 reps

## 2021-03-26 ENCOUNTER — Encounter: Payer: Self-pay | Admitting: Family Medicine

## 2021-03-26 ENCOUNTER — Ambulatory Visit (INDEPENDENT_AMBULATORY_CARE_PROVIDER_SITE_OTHER): Payer: Medicare HMO | Admitting: Family Medicine

## 2021-03-26 VITALS — BP 149/80 | HR 99 | Temp 98.3°F | Resp 16 | Wt 208.2 lb

## 2021-03-26 DIAGNOSIS — R7303 Prediabetes: Secondary | ICD-10-CM

## 2021-03-26 DIAGNOSIS — Z23 Encounter for immunization: Secondary | ICD-10-CM | POA: Diagnosis not present

## 2021-03-26 DIAGNOSIS — I10 Essential (primary) hypertension: Secondary | ICD-10-CM | POA: Diagnosis not present

## 2021-03-26 DIAGNOSIS — M2550 Pain in unspecified joint: Secondary | ICD-10-CM | POA: Diagnosis not present

## 2021-03-26 DIAGNOSIS — Z1211 Encounter for screening for malignant neoplasm of colon: Secondary | ICD-10-CM

## 2021-03-26 LAB — POCT GLYCOSYLATED HEMOGLOBIN (HGB A1C): Hemoglobin A1C: 6.3 % — AB (ref 4.0–5.6)

## 2021-03-26 MED ORDER — TRIAMCINOLONE ACETONIDE 40 MG/ML IJ SUSP
40.0000 mg | Freq: Once | INTRAMUSCULAR | Status: AC
  Administered 2021-03-26: 16:00:00 40 mg via INTRAMUSCULAR

## 2021-03-27 NOTE — Progress Notes (Signed)
Established Patient Office Visit  Subjective:  Patient ID: [name], male    DOB: Jan 12, 1956  Age: 66 y.o. MRN: [id number]  CC: No chief complaint on file.   HPI Ki Corbo presents for complaint of multiple joint pains. He has been taking some tylenol intermittently for sx without results.   Past Medical History:  Diagnosis Date   Agatston coronary artery calcium score between 200 and 399 10/2020   Coronary CTA showed increased coronary Ca at 237 with minimal CAD in the LCx and RCA.   Erectile dysfunction    Essential hypertension 11/30/2019   Gout    Infected prepatellar bursa, right    Moderate episode of recurrent major depressive disorder ([place]) 08/26/2020   Polyarthritis    Pre-diabetes     Past Surgical History:  Procedure Laterality Date   CHOLECYSTECTOMY     EXCISION OF BACK LESION Left 05/02/2020   Procedure: EXCISION OF BACK LESION, left lower back cyst;  Surgeon: Olean Ree, MD;  Location: [facility];  Service: General;  Laterality: Left;   IRRIGATION AND DEBRIDEMENT KNEE Right 12/28/2018   Procedure: IRRIGATION AND DEBRIDEMENT KNEE;  Surgeon: Mcarthur Rossetti, MD;  Location: [facility];  Service: Orthopedics;  Laterality: Right;   KNEE SURGERY     ROTATOR CUFF REPAIR     THUMB ARTHROSCOPY Right 2007    Family History  Problem Relation Age of Onset   Breast cancer Mother    Prostate cancer Father    Hypertension Father    Coronary artery disease Brother 17       MIs with CABG   Coronary artery disease Brother 26       MIs with CABG   Coronary artery disease Brother 39       MIs   Lung cancer Son     Social History   Socioeconomic History   Marital status: Married    Spouse name: Not on file   Number of children: Not on file   Years of education: Not on file   Highest education level: Not on file  Occupational History   Not on file  Tobacco Use   Smoking status: Never   Smokeless tobacco: Never  Vaping Use   Vaping Use: Never  used  Substance and Sexual Activity   Alcohol use: No    Alcohol/week: 0.0 standard drinks   Drug use: No   Sexual activity: Not Currently  Other Topics Concern   Not on file  Social History Narrative   Not on file   Social Determinants of Health   Financial Resource Strain: Not on file  Food Insecurity: Not on file  Transportation Needs: Not on file  Physical Activity: Not on file  Stress: Not on file  Social Connections: Not on file  Intimate Partner Violence: Not on file    ROS Review of Systems  Constitutional:  Negative for chills and fever.  Musculoskeletal:  Positive for arthralgias.  All other systems reviewed and are negative.  Objective:   Today's Vitals: BP (!) 149/80    Pulse 99    Temp 98.3 F (36.8 C) (Oral)    Resp 16    Wt 208 lb 3.2 oz (94.4 kg)    SpO2 93%    BMI 30.75 kg/m   Physical Exam Vitals and nursing note reviewed.  Constitutional:      General: He is not in acute distress. Cardiovascular:     Rate and Rhythm: Normal rate and regular rhythm.  Pulmonary:  Effort: Pulmonary effort is normal.     Breath sounds: Normal breath sounds.  Abdominal:     Palpations: Abdomen is soft.     Tenderness: There is no abdominal tenderness.  Musculoskeletal:        General: Tenderness (multiple joints) present. No deformity.     Right lower leg: No edema.     Left lower leg: No edema.  Neurological:     General: No focal deficit present.     Mental Status: He is alert and oriented to person, place, and time.    Assessment & Plan:   1. Essential hypertension Continue present management and monitor.   2. Polyarthralgia Kenalog IM injection given. Patient to d/c lipitor - triamcinolone acetonide (KENALOG-40) injection 40 mg  3. Prediabetes  - POCT glycosylated hemoglobin (Hb A1C)  4. Screening for colon cancer Referral for cologuard.  - Cologuard  5. Need for shingles vaccine     Outpatient Encounter Medications as of 03/26/2021   Medication Sig   Accu-Chek Softclix Lancets lancets Use as instructed   acetaminophen (TYLENOL) 500 MG tablet Take 2 tablets (1,000 mg total) by mouth every 6 (six) hours as needed for mild pain.   aspirin EC 81 MG tablet Take 1 tablet (81 mg total) by mouth daily. Swallow whole.   atorvastatin (LIPITOR) 10 MG tablet Take 1 tablet (10 mg total) by mouth daily. (Patient not taking: Reported on 03/11/2021)   DULoxetine (CYMBALTA) 30 MG capsule Take 1 capsule (30 mg total) by mouth daily.   glucose blood (ACCU-CHEK AVIVA PLUS) test strip Use as instructed   ibuprofen (ADVIL) 600 MG tablet Take 1 tablet (600 mg total) by mouth every 8 (eight) hours as needed for moderate pain.   lisinopril (ZESTRIL) 10 MG tablet Take 1 tablet (10 mg total) by mouth daily.   loratadine (CLARITIN) 10 MG tablet Take 1 tablet (10 mg total) by mouth daily. (Patient not taking: Reported on 03/11/2021)   metFORMIN (GLUCOPHAGE XR) 500 MG 24 hr tablet Take 1 tablet (500 mg total) by mouth daily with breakfast.   metoprolol tartrate (LOPRESSOR) 100 MG tablet Take 1 tablet (100 mg total) by mouth once for 1 dose. Take 1 tablet two hours prior to CT scan (Patient not taking: Reported on 03/11/2021)   predniSONE (DELTASONE) 20 MG tablet Take 1 tablet (20 mg total) by mouth daily with breakfast for 20 days.   Saw Palmetto, Serenoa repens, (SAW PALMETTO PO) Take 1 capsule by mouth daily. (Patient not taking: Reported on 03/11/2021)   tiZANidine (ZANAFLEX) 4 MG tablet Take 0.5-1 tablets (2-4 mg total) by mouth every 6 (six) hours as needed for muscle spasms. Do not drink alcohol or drive while taking this medication.  May cause drowsiness. (Patient not taking: Reported on 03/11/2021)   Vitamin D, Ergocalciferol, (DRISDOL) 1.25 MG (50000 UNIT) CAPS capsule Take 1 capsule (50,000 Units total) by mouth every 7 (seven) days.   [EXPIRED] triamcinolone acetonide (KENALOG-40) injection 40 mg    No facility-administered encounter medications on  file as of 03/26/2021.    Follow-up: No follow-ups on file.   Becky Sax, MD

## 2021-03-31 ENCOUNTER — Encounter: Payer: Self-pay | Admitting: Family Medicine

## 2021-04-02 ENCOUNTER — Encounter: Payer: Self-pay | Admitting: Rehabilitative and Restorative Service Providers"

## 2021-04-02 ENCOUNTER — Ambulatory Visit (INDEPENDENT_AMBULATORY_CARE_PROVIDER_SITE_OTHER): Payer: Medicare HMO | Admitting: Rehabilitative and Restorative Service Providers"

## 2021-04-02 ENCOUNTER — Other Ambulatory Visit: Payer: Self-pay

## 2021-04-02 DIAGNOSIS — M6281 Muscle weakness (generalized): Secondary | ICD-10-CM | POA: Diagnosis not present

## 2021-04-02 DIAGNOSIS — M25512 Pain in left shoulder: Secondary | ICD-10-CM | POA: Diagnosis not present

## 2021-04-02 DIAGNOSIS — M25511 Pain in right shoulder: Secondary | ICD-10-CM | POA: Diagnosis not present

## 2021-04-02 DIAGNOSIS — G8929 Other chronic pain: Secondary | ICD-10-CM

## 2021-04-02 DIAGNOSIS — M542 Cervicalgia: Secondary | ICD-10-CM | POA: Diagnosis not present

## 2021-04-02 NOTE — Patient Instructions (Signed)
Access Code: [email or web address] URL: [email or web address] Date: 04/02/2021 Prepared by: Scot Jun  Exercises Shoulder Internal Rotation with Resistance - 2 x daily - 6 x weekly - 2-3 sets - 10 reps Shoulder External Rotation and Scapular Retraction with Resistance - 2 x daily - 6 x weekly - 2-3 sets - 10 reps Cervical Extension AROM with Strap - 2 x daily - 6 x weekly - 2 sets - 10 reps Supine Shoulder Horizontal Abduction with Resistance - 2 x daily - 7 x weekly - 3 sets - 10-15 reps Shoulder External Rotation with Anchored Resistance - 2 x daily - 7 x weekly - 10 reps - 3 sets Standing Shoulder Row with Anchored Resistance - 2 x daily - 7 x weekly - 10 reps - 3 sets Shoulder Extension with Resistance - 2 x daily - 7 x weekly - 10 reps - 3 sets

## 2021-04-02 NOTE — Therapy (Signed)
[facility] Physical Therapy [address] [place], [place], [address] Phone: [phone]   Fax:  [phone]  Physical Therapy Treatment  Patient Details  Name: [name] MRN: [id number] Date of Birth: 12/16/1955 Referring Provider (PT): Pete Pelt, [place]   Encounter Date: 04/02/2021   PT End of Session - 04/02/21 1134     Visit Number 2    Number of Visits 12    Date for PT Re-Evaluation 05/06/21    Authorization Type [employer]    Authorization - Visit Number 2    Authorization - Number of Visits 12    PT Start Time 1107    PT Stop Time 9767    PT Time Calculation (min) 38 min    Activity Tolerance Patient tolerated treatment well    Behavior During Therapy [facility] for tasks assessed/performed             Past Medical History:  Diagnosis Date   Agatston coronary artery calcium score between 200 and 399 10/2020   Coronary CTA showed increased coronary Ca at 237 with minimal CAD in the LCx and RCA.   Erectile dysfunction    Essential hypertension 11/30/2019   Gout    Infected prepatellar bursa, right    Moderate episode of recurrent major depressive disorder ([place]) 08/26/2020   Polyarthritis    Pre-diabetes     Past Surgical History:  Procedure Laterality Date   CHOLECYSTECTOMY     EXCISION OF BACK LESION Left 05/02/2020   Procedure: EXCISION OF BACK LESION, left lower back cyst;  Surgeon: Olean Ree, MD;  Location: [facility] ORS;  Service: General;  Laterality: Left;   IRRIGATION AND DEBRIDEMENT KNEE Right 12/28/2018   Procedure: IRRIGATION AND DEBRIDEMENT KNEE;  Surgeon: Mcarthur Rossetti, MD;  Location: [facility] ORS;  Service: Orthopedics;  Laterality: Right;   KNEE SURGERY     ROTATOR CUFF REPAIR     THUMB ARTHROSCOPY Right 2007    There were no vitals filed for this visit.   Subjective Assessment - 04/02/21 1108     Subjective Pt. indicated feeling about the same overall, achy and sore today.  Worse in morning    Pertinent History  [place] Tendon Surgery (October 2022), Previous L shoulder surgery (1970's), Prediabetes, Essential hypertension, Chronic pain of multiple joints, Moderate episode of recurrent major depressive disorder ([place]), CVD (cardiovascular disease)    Limitations House hold activities;Lifting    Diagnostic tests "Right shoulder 3 views: Shows no acute fractures, Downsloping acromion, No significant arthropathy, Shoulders well located." "Left shoulder 3 views.  Left shoulder is well located.  No significant   arthropathy.  Glenohumeral joint well-maintained.  No acute fractures"    Patient Stated Goals Understand shoulder pain, Decrease pain    Currently in Pain? Yes    Pain Score 5     Pain Location Shoulder    Pain Orientation Left;Right    Pain Descriptors / Indicators Aching    Pain Type Chronic pain    Pain Onset More than a month ago    Pain Frequency Constant    Aggravating Factors  difficulty lifting arms    Pain Relieving Factors nothing specific reported                [facility] PT Assessment - 04/02/21 0001       Assessment   Medical Diagnosis Chronic right shoulder pain, Chronic left shoulder pain    Referring Provider (PT) Pete Pelt, PA-C      AROM   Overall  AROM Comments bilateral flexion elevation against gravity to approx. 110 degrees today    Right Shoulder Flexion 155 Degrees   in supine   Right Shoulder External Rotation 90 Degrees   in 90 deg abduction in supine   Left Shoulder Flexion 150 Degrees   in supine   Left Shoulder External Rotation 85 Degrees   in 90 deg abduction in supine     PROM   Overall PROM Comments No limitation or pain in PROM for bilateral shoulder joint      Strength   Right Shoulder ABduction 5/5    Right Shoulder Internal Rotation 5/5    Right Shoulder External Rotation 5/5    Left Shoulder Internal Rotation 5/5    Left Shoulder External Rotation 4/5                           [facility] Adult PT Treatment/Exercise -  04/02/21 0001       Exercises   Exercises Shoulder;Other Exercises    Other Exercises  Review of existing HEP, adjustments per handout      Shoulder Exercises: Supine   Horizontal ABduction Both   2 x 15   Theraband Level (Shoulder Horizontal ABduction) Level 3 (Green)    External Rotation --    Theraband Level (Shoulder External Rotation) --    Internal Rotation --    Theraband Level (Shoulder Internal Rotation) --      Shoulder Exercises: Standing   External Rotation Both   3 x 10 c towel at side   Theraband Level (Shoulder External Rotation) Level 3 (Green)    Internal Rotation Both   x20 c towel at side   Theraband Level (Shoulder Internal Rotation) Level 3 (Green)    Extension 20 reps    Theraband Level (Shoulder Extension) Level 3 (Green)    Row 20 reps;Both    Theraband Level (Shoulder Row) Level 3 (Green)      Manual Therapy   Manual therapy comments passive range flexion, abduction bilateral                     PT Education - 04/02/21 1134     Education Details HEP progression    Person(s) Educated Patient    Methods Explanation;Demonstration;Verbal cues;Handout    Comprehension Verbalized understanding;Returned demonstration              PT Short Term Goals - 04/02/21 1134       PT SHORT TERM GOAL #1   Title Increase AROM R/L shoulder flexion 10 deg.    Time 4    Period Weeks    Status Achieved    Target Date 04/22/21      PT SHORT TERM GOAL #2   Title Increase AROM R/L shoulder abduction 10 deg.    Time 4    Period Weeks    Status On-going    Target Date 04/22/21      PT SHORT TERM GOAL #3   Title Decrease morning pain 25%    Baseline 10/10 now    Time 4    Period Weeks    Status On-going    Target Date 04/22/21               PT Long Term Goals - 03/25/21 1140       PT LONG TERM GOAL #1   Title Pt will be I and compliant with HEP and verbalize plan for continued exercise  post PT discharge    Time 6    Period Weeks     Status New    Target Date 05/06/21      PT LONG TERM GOAL #2   Title Increase FOTO score to 65%    Baseline 40%    Time 6    Period Weeks    Status New    Target Date 05/06/21      PT LONG TERM GOAL #3   Title Pt will reduce pain to overall less than 4-5/10 with usual activity and work activity.    Time 6    Period Weeks    Status New    Target Date 05/06/21      PT LONG TERM GOAL #4   Title Bilateral shoulder flexion and abduction WFL    Time 6    Period Weeks    Status New    Target Date 05/06/21                   Plan - 04/02/21 1140     Clinical Impression Statement Active range in gravity reduced positioning WFL s symptoms as noted today.  Pt. to benefit from progressive strengthening and movement coordination improvements for bilateral shoulder to improve mechanics of movement to facilitate improved functional use of arm.    Personal Factors and Comorbidities Comorbidity 3+;Time since onset of injury/illness/exacerbation    Comorbidities Prediabetes, Essential hypertension, Chronic pain of multiple joints, Moderate episode of recurrent major depressive disorder ([place]), CVD (cardiovascular disease)    Examination-Activity Limitations Carry;Reach Overhead;Lift    Examination-Participation Restrictions Occupation;Cleaning;Community Activity    Stability/Clinical Decision Making Evolving/Moderate complexity    Rehab Potential Good    PT Frequency 2x / week    PT Duration 6 weeks    PT Treatment/Interventions ADLs/Self Care Home Management;Cryotherapy;Electrical Stimulation;Iontophoresis 4mg /ml Dexamethasone;Moist Heat;Ultrasound;Traction;Therapeutic activities;Therapeutic exercise;Balance training;Stair training;Gait training;Patient/family education;Manual techniques;Energy conservation;Passive range of motion;Dry needling;Vasopneumatic Device;Joint Manipulations;Spinal Manipulations    PT Next Visit Plan Progressive scapular and rotator cuff  control/strengthening.    PT Home Exercise Plan Access Code: [email or web address]    Consulted and Agree with Plan of Care Patient             Patient will benefit from skilled therapeutic intervention in order to improve the following deficits and impairments:  Decreased endurance, Decreased mobility, Hypomobility, Decreased range of motion, Decreased activity tolerance, Decreased strength, Impaired flexibility, Impaired UE functional use, Pain  Visit Diagnosis: Chronic right shoulder pain  Chronic left shoulder pain  Muscle weakness (generalized)  Cervicalgia     Problem List Patient Active Problem List   Diagnosis Date Noted   Chronic pain of multiple joints 12/27/2020   Agatston coronary artery calcium score between 200 and 399 11/12/2020   CVD (cardiovascular disease) 08/27/2020   Moderate episode of recurrent major depressive disorder ([place]) 08/26/2020   Epidermoid cyst of skin of back    Grief reaction 02/09/2020   Influenza vaccine refused 01/17/2020   Hyperlipidemia 12/12/2019   Elevated PSA, less than 10 ng/ml 12/12/2019   Vitamin D deficiency 12/12/2019   Abnormal thyroid blood test 12/12/2019   Essential hypertension 11/30/2019   Subcutaneous nodule of back 11/30/2019   Prepatellar abscess 12/29/2018   Cellulitis of right knee 12/28/2018   Infected prepatellar bursa, right    Prediabetes 07/08/2016   Elevated blood pressure reading without diagnosis of hypertension 06/01/2016   Dental caries 05/21/2015   Thrombocytosis 04/24/2015   Erectile dysfunction 04/23/2015   Polyarthritis 03/08/2015   ROTATOR  CUFF INJURY, LEFT SHOULDER 06/20/2009   Scot Jun, PT, DPT, OCS, ATC 04/02/21  11:41 AM    [facility] Physical Therapy [address] [place], [place], [address] Phone: [phone]   Fax:  [phone]  Name: Jethro Radke MRN: [id number] Date of Birth: 1955-06-24

## 2021-04-03 ENCOUNTER — Encounter: Payer: Self-pay | Admitting: Physician Assistant

## 2021-04-03 ENCOUNTER — Ambulatory Visit (INDEPENDENT_AMBULATORY_CARE_PROVIDER_SITE_OTHER): Payer: Medicare HMO | Admitting: Physician Assistant

## 2021-04-03 ENCOUNTER — Ambulatory Visit (INDEPENDENT_AMBULATORY_CARE_PROVIDER_SITE_OTHER): Payer: Medicare HMO

## 2021-04-03 DIAGNOSIS — M25512 Pain in left shoulder: Secondary | ICD-10-CM

## 2021-04-03 DIAGNOSIS — G8929 Other chronic pain: Secondary | ICD-10-CM | POA: Diagnosis not present

## 2021-04-03 DIAGNOSIS — M542 Cervicalgia: Secondary | ICD-10-CM

## 2021-04-03 DIAGNOSIS — M25511 Pain in right shoulder: Secondary | ICD-10-CM

## 2021-04-03 MED ORDER — LIDOCAINE HCL 1 % IJ SOLN
3.0000 mL | INTRAMUSCULAR | Status: AC | PRN
  Administered 2021-04-03: 3 mL

## 2021-04-03 MED ORDER — METHYLPREDNISOLONE ACETATE 40 MG/ML IJ SUSP
40.0000 mg | INTRAMUSCULAR | Status: AC | PRN
  Administered 2021-04-03: 40 mg via INTRA_ARTICULAR

## 2021-04-03 NOTE — Progress Notes (Signed)
Office Visit Note   Patient: [name]           Date of Birth: 20-Nov-1955           MRN: [id number] Visit Date: 04/03/2021              Requested by: [name], [place] [address] [place],  [place] [address] PCP: Austin Mai, MD   Assessment & Plan: Visit Diagnoses:  1. Neck pain   2. Chronic left shoulder pain   3. Chronic right shoulder pain     Plan: Given his multiple joint pain that is better after the first few hours in the morning still recommend he see rheumatology.  The phone number for rheumatology was given.  Notes in the chart states that rheumatology did try to reach him but were unable to reach him or leave voice messages due to his mailbox being full.  In regards to his neck pain recommend MRI of the cervical spine rule out HNP as the source of his shoulder pain and the numbness between his shoulder blades.  He will follow-up after the MRI to go over results and discuss further treatment.  Questions were encouraged and answered.  He tolerated the left shoulder injection well today.  Follow-Up Instructions: Return After MRI.   Orders:  Orders Placed This Encounter  Procedures   Large Joint Inj: L subacromial bursa   XR Cervical Spine 2 or 3 views   No orders of the defined types were placed in this encounter.     Procedures: Large Joint Inj: L subacromial bursa on 04/03/2021 1:21 PM Indications: pain Details: 22 G 1.5 in needle, superior approach  Arthrogram: No  Medications: 3 mL lidocaine 1 %; 40 mg methylPREDNISolone acetate 40 MG/ML Outcome: tolerated well, no immediate complications Procedure, treatment alternatives, risks and benefits explained, specific risks discussed. Consent was given by the patient. Immediately prior to procedure a time out was called to verify the correct patient, procedure, equipment, support staff and site/side marked as required. Patient was prepped and draped in the usual sterile fashion.       Clinical Data: No additional findings.   Subjective: Chief Complaint  Patient presents with   Right Shoulder - Pain, Follow-up   Left Shoulder - Pain, Follow-up    HPI [name] comes in today for follow-up bilateral shoulder pain.  He states the injection in his right shoulder improved his pain by 75%.  He still has during the day especially first thing in the morning 5-6 out of 10 pain in the right shoulder.  But having no night pain now.  He rates his left shoulder pain to be 6-7 out of 10 pain at worst.  He reports that he had a pop in his neck while yawning a month ago and has had some numbness between his shoulder blades since then.  Denies any numbness tingling down by bilateral arms but does have pain that radiates down into the shoulders. ER note March 2022 reveal similar symptoms that he was having at that time.  He had been shoveling in the yard and felt a popping sensation in his lower neck upper back and had numbness tingling between his shoulder blades.  He had no radicular symptoms.  Radiographs at that time of his cervical spine showed no acute fractures.  However did show foraminal narrowing particularly on the right at C4-C5 and C6 3 C4.  Bilateral carotid arthrosclerosis was noted at that time.  He was  treated conservatively and did well until recently.  He notes he has multiple joint aches particularly first thing in the morning but this gets better with use throughout the day.  Review of Systems See HPI otherwise negative.  Objective: Vital Signs: There were no vitals taken for this visit.  Physical Exam General well-developed well-nourished male no acute distress mood affect appropriate. Ortho Exam Bilateral shoulders 5 out of 5 with external and internal rotation against resistance.  Empty can test is negative bilaterally.  Liftoff test is negative bilaterally.  Has full forward flexion of the right shoulder today.  Left shoulder active motion approximately  140 degrees.  Positive impingement on the left shoulder negative on the right. Cervical spine good range of motion of the cervical spine without pain.  Tenderness over the lower lumbar spine and subjective decrease sensation over her region of C7-T1. Upper extremities he has 5 out of 5 strength throughout the upper extremities otherwise. Specialty Comments:  No specialty comments available.  Imaging: XR Cervical Spine 2 or 3 views  Result Date: 04/03/2021 Cervical spine 2 views: This space overall well-maintained no acute fractures.  No bony abnormalities.  Normal lordotic curvature.  Carotid arthrosclerosis noted.    PMFS History: Patient Active Problem List   Diagnosis Date Noted   Chronic pain of multiple joints 12/27/2020   Agatston coronary artery calcium score between 200 and 399 11/12/2020   CVD (cardiovascular disease) 08/27/2020   Moderate episode of recurrent major depressive disorder ([place]) 08/26/2020   Epidermoid cyst of skin of back    Grief reaction 02/09/2020   Influenza vaccine refused 01/17/2020   Hyperlipidemia 12/12/2019   Elevated PSA, less than 10 ng/ml 12/12/2019   Vitamin D deficiency 12/12/2019   Abnormal thyroid blood test 12/12/2019   Essential hypertension 11/30/2019   Subcutaneous nodule of back 11/30/2019   Prepatellar abscess 12/29/2018   Cellulitis of right knee 12/28/2018   Infected prepatellar bursa, right    Prediabetes 07/08/2016   Elevated blood pressure reading without diagnosis of hypertension 06/01/2016   Dental caries 05/21/2015   Thrombocytosis 04/24/2015   Erectile dysfunction 04/23/2015   Polyarthritis 03/08/2015   ROTATOR CUFF INJURY, LEFT SHOULDER 06/20/2009   Past Medical History:  Diagnosis Date   Agatston coronary artery calcium score between 200 and 399 10/2020   Coronary CTA showed increased coronary Ca at 237 with minimal CAD in the LCx and RCA.   Erectile dysfunction    Essential hypertension 11/30/2019   Gout    Infected  prepatellar bursa, right    Moderate episode of recurrent major depressive disorder ([place]) 08/26/2020   Polyarthritis    Pre-diabetes     Family History  Problem Relation Age of Onset   Breast cancer Mother    Prostate cancer Father    Hypertension Father    Coronary artery disease Brother 64       MIs with CABG   Coronary artery disease Brother 29       MIs with CABG   Coronary artery disease Brother 77       MIs   Lung cancer Son     Past Surgical History:  Procedure Laterality Date   CHOLECYSTECTOMY     EXCISION OF BACK LESION Left 05/02/2020   Procedure: EXCISION OF BACK LESION, left lower back cyst;  Surgeon: Olean Ree, MD;  Location: [facility] ORS;  Service: General;  Laterality: Left;   IRRIGATION AND DEBRIDEMENT KNEE Right 12/28/2018   Procedure: IRRIGATION AND DEBRIDEMENT KNEE;  Surgeon:  Mcarthur Rossetti, MD;  Location: [facility];  Service: Orthopedics;  Laterality: Right;   KNEE SURGERY     ROTATOR CUFF REPAIR     THUMB ARTHROSCOPY Right 2007   Social History   Occupational History   Not on file  Tobacco Use   Smoking status: Never   Smokeless tobacco: Never  Vaping Use   Vaping Use: Never used  Substance and Sexual Activity   Alcohol use: No    Alcohol/week: 0.0 standard drinks   Drug use: No   Sexual activity: Not Currently

## 2021-04-04 ENCOUNTER — Encounter: Payer: Medicare HMO | Admitting: Rehabilitative and Restorative Service Providers"

## 2021-04-04 NOTE — Addendum Note (Signed)
Addended by: Robyne Peers on: 04/04/2021 01:45 PM   Modules accepted: Orders

## 2021-04-08 ENCOUNTER — Encounter: Payer: Self-pay | Admitting: Physical Therapy

## 2021-04-08 ENCOUNTER — Ambulatory Visit: Payer: Medicare HMO | Admitting: Physical Therapy

## 2021-04-08 ENCOUNTER — Other Ambulatory Visit: Payer: Self-pay

## 2021-04-08 DIAGNOSIS — M6281 Muscle weakness (generalized): Secondary | ICD-10-CM

## 2021-04-08 DIAGNOSIS — M542 Cervicalgia: Secondary | ICD-10-CM | POA: Diagnosis not present

## 2021-04-08 DIAGNOSIS — G8929 Other chronic pain: Secondary | ICD-10-CM

## 2021-04-08 DIAGNOSIS — M25511 Pain in right shoulder: Secondary | ICD-10-CM | POA: Diagnosis not present

## 2021-04-08 DIAGNOSIS — M25512 Pain in left shoulder: Secondary | ICD-10-CM | POA: Diagnosis not present

## 2021-04-08 NOTE — Therapy (Signed)
[facility] Physical Therapy [address] [place], [place], [address] Phone: [phone]   Fax:  [phone]  Physical Therapy Treatment  Patient Details  Name: [name] MRN: [id number] Date of Birth: 22-Dec-1955 Referring Provider (PT): Pete Pelt, [place]   Encounter Date: 04/08/2021   PT End of Session - 04/08/21 1213     Visit Number 3    Number of Visits 12    Date for PT Re-Evaluation 05/06/21    Authorization Type [employer]    Authorization - Visit Number 3    Authorization - Number of Visits 12    PT Start Time 1156   he arrives late   PT Stop Time 1230    PT Time Calculation (min) 34 min    Activity Tolerance Patient tolerated treatment well    Behavior During Therapy [facility] for tasks assessed/performed             Past Medical History:  Diagnosis Date   Agatston coronary artery calcium score between 200 and 399 10/2020   Coronary CTA showed increased coronary Ca at 237 with minimal CAD in the LCx and RCA.   Erectile dysfunction    Essential hypertension 11/30/2019   Gout    Infected prepatellar bursa, right    Moderate episode of recurrent major depressive disorder ([place]) 08/26/2020   Polyarthritis    Pre-diabetes     Past Surgical History:  Procedure Laterality Date   CHOLECYSTECTOMY     EXCISION OF BACK LESION Left 05/02/2020   Procedure: EXCISION OF BACK LESION, left lower back cyst;  Surgeon: Olean Ree, MD;  Location: [facility] ORS;  Service: General;  Laterality: Left;   IRRIGATION AND DEBRIDEMENT KNEE Right 12/28/2018   Procedure: IRRIGATION AND DEBRIDEMENT KNEE;  Surgeon: Mcarthur Rossetti, MD;  Location: [facility]S;  Service: Orthopedics;  Laterality: Right;   KNEE SURGERY     ROTATOR CUFF REPAIR     THUMB ARTHROSCOPY Right 2007    There were no vitals filed for this visit.   Subjective Assessment - 04/08/21 1159     Subjective Pt. states that both shoulders are feeling sore today, however he reports no pain. Pt.  states that he has been compliant with his HEP and the exercises are going well.    Pertinent History Achilles Tendon Surgery (October 2022), Previous L shoulder surgery (1970's), Prediabetes, Essential hypertension, Chronic pain of multiple joints, Moderate episode of recurrent major depressive disorder ([place]), CVD (cardiovascular disease)    Limitations House hold activities;Lifting    Diagnostic tests "Right shoulder 3 views: Shows no acute fractures, Downsloping acromion, No significant arthropathy, Shoulders well located." "Left shoulder 3 views.  Left shoulder is well located.  No significant   arthropathy.  Glenohumeral joint well-maintained.  No acute fractures"    Patient Stated Goals Understand shoulder pain, Decrease pain    Pain Onset More than a month ago                               [facility] Adult PT Treatment/Exercise - 04/08/21 0001       Shoulder Exercises: Supine   Other Supine Exercises bilat. shoulder flexion with red therband around wrist x 10      Shoulder Exercises: Standing   External Rotation Both;20 reps    Theraband Level (Shoulder External Rotation) Level 3 (Green)    Theraband Level (Shoulder ABduction) Level 2 (Red)    ABduction Limitations 2 x 10; Both  UE    Extension Both;20 reps    Theraband Level (Shoulder Extension) Level 4 (Blue)    Row 20 reps;Both    Theraband Level (Shoulder Row) Level 4 (Blue)    Other Standing Exercises Red ball walking up wall into shoulder flexion x 15 3 sec. hold at top    Other Standing Exercises Pec stretch in doorway bilat. UE 10 x 10 sec. hold (P)       Shoulder Exercises: ROM/Strengthening   UBE (Upper Arm Bike) Level 3.0 x 6 minutes (3 min forward/ 3 min backward)                       PT Short Term Goals - 04/02/21 1134       PT SHORT TERM GOAL #1   Title Increase AROM R/L shoulder flexion 10 deg.    Time 4    Period Weeks    Status Achieved    Target Date 04/22/21      PT SHORT  TERM GOAL #2   Title Increase AROM R/L shoulder abduction 10 deg.    Time 4    Period Weeks    Status On-going    Target Date 04/22/21      PT SHORT TERM GOAL #3   Title Decrease morning pain 25%    Baseline 10/10 now    Time 4    Period Weeks    Status On-going    Target Date 04/22/21               PT Long Term Goals - 03/25/21 1140       PT LONG TERM GOAL #1   Title Pt will be I and compliant with HEP and verbalize plan for continued exercise post PT discharge    Time 6    Period Weeks    Status New    Target Date 05/06/21      PT LONG TERM GOAL #2   Title Increase FOTO score to 65%    Baseline 40%    Time 6    Period Weeks    Status New    Target Date 05/06/21      PT LONG TERM GOAL #3   Title Pt will reduce pain to overall less than 4-5/10 with usual activity and work activity.    Time 6    Period Weeks    Status New    Target Date 05/06/21      PT LONG TERM GOAL #4   Title Bilateral shoulder flexion and abduction WFL    Time 6    Period Weeks    Status New    Target Date 05/06/21                   Plan - 04/08/21 1217     Clinical Impression Statement Session focused on increasing strength and ROM of bilat shoulder's. Pt. is showing improvement with strengthening of the shoulder's as seen with inreasing theraband level today with exercises. There are still limtations present in tightness and muscle fatigue in bilat. shoulder's.    Personal Factors and Comorbidities Comorbidity 3+;Time since onset of injury/illness/exacerbation    Comorbidities Prediabetes, Essential hypertension, Chronic pain of multiple joints, Moderate episode of recurrent major depressive disorder ([place]), CVD (cardiovascular disease)    Examination-Activity Limitations Carry;Reach Overhead;Lift    Examination-Participation Restrictions Occupation;Cleaning;Community Activity    Stability/Clinical Decision Making Evolving/Moderate complexity    Rehab Potential Good    PT  Frequency 2x / week    PT Duration 6 weeks    PT Treatment/Interventions ADLs/Self Care Home Management;Cryotherapy;Electrical Stimulation;Iontophoresis 4mg /ml Dexamethasone;Moist Heat;Ultrasound;Traction;Therapeutic activities;Therapeutic exercise;Balance training;Stair training;Gait training;Patient/family education;Manual techniques;Energy conservation;Passive range of motion;Dry needling;Vasopneumatic Device;Joint Manipulations;Spinal Manipulations    PT Next Visit Plan Progress shoulder strengthening exercises, add overhead exercises    PT Home Exercise Plan Access Code: [email or web address]    Consulted and Agree with Plan of Care Patient             Patient will benefit from skilled therapeutic intervention in order to improve the following deficits and impairments:  Decreased endurance, Decreased mobility, Hypomobility, Decreased range of motion, Decreased activity tolerance, Decreased strength, Impaired flexibility, Impaired UE functional use, Pain  Visit Diagnosis: Chronic right shoulder pain  Muscle weakness (generalized)  Cervicalgia  Chronic left shoulder pain     Problem List Patient Active Problem List   Diagnosis Date Noted   Chronic pain of multiple joints 12/27/2020   Agatston coronary artery calcium score between 200 and 399 11/12/2020   CVD (cardiovascular disease) 08/27/2020   Moderate episode of recurrent major depressive disorder ([place]) 08/26/2020   Epidermoid cyst of skin of back    Grief reaction 02/09/2020   Influenza vaccine refused 01/17/2020   Hyperlipidemia 12/12/2019   Elevated PSA, less than 10 ng/ml 12/12/2019   Vitamin D deficiency 12/12/2019   Abnormal thyroid blood test 12/12/2019   Essential hypertension 11/30/2019   Subcutaneous nodule of back 11/30/2019   Prepatellar abscess 12/29/2018   Cellulitis of right knee 12/28/2018   Infected prepatellar bursa, right    Prediabetes 07/08/2016   Elevated blood pressure reading without diagnosis of  hypertension 06/01/2016   Dental caries 05/21/2015   Thrombocytosis 04/24/2015   Erectile dysfunction 04/23/2015   Polyarthritis 03/08/2015   ROTATOR CUFF INJURY, LEFT SHOULDER 46/65/9935    Ariele Vidrio Singer, Student-PT 04/08/2021, 2:01 PM  [facility] Physical Therapy [address] [place], [place], [address] Phone: [phone]   Fax:  [phone]  Name: Darell Saputo MRN: [id number] Date of Birth: [birth year]

## 2021-04-11 ENCOUNTER — Encounter: Payer: Medicare HMO | Admitting: Physical Therapy

## 2021-04-14 ENCOUNTER — Encounter: Payer: Medicare HMO | Admitting: Physical Therapy

## 2021-04-14 ENCOUNTER — Telehealth: Payer: Self-pay | Admitting: Physical Therapy

## 2021-04-14 NOTE — Telephone Encounter (Signed)
Pt. Did not show for PT appt. Today. I called pt. And he states he forgot about this appt. He was reminded about his next appt. And he confirms he will be there.  Ysidro Evert, SPT

## 2021-04-16 ENCOUNTER — Encounter: Payer: Medicare HMO | Admitting: Physical Therapy

## 2021-04-23 ENCOUNTER — Encounter: Payer: Self-pay | Admitting: Physical Therapy

## 2021-04-23 ENCOUNTER — Other Ambulatory Visit: Payer: Self-pay

## 2021-04-23 ENCOUNTER — Ambulatory Visit (INDEPENDENT_AMBULATORY_CARE_PROVIDER_SITE_OTHER): Payer: Medicare HMO | Admitting: Physical Therapy

## 2021-04-23 DIAGNOSIS — M25512 Pain in left shoulder: Secondary | ICD-10-CM

## 2021-04-23 DIAGNOSIS — M25511 Pain in right shoulder: Secondary | ICD-10-CM | POA: Diagnosis not present

## 2021-04-23 DIAGNOSIS — M6281 Muscle weakness (generalized): Secondary | ICD-10-CM | POA: Diagnosis not present

## 2021-04-23 DIAGNOSIS — G8929 Other chronic pain: Secondary | ICD-10-CM

## 2021-04-23 DIAGNOSIS — M542 Cervicalgia: Secondary | ICD-10-CM

## 2021-04-23 NOTE — Therapy (Signed)
[facility] Physical Therapy [address] [place], [place], [address] Phone: [phone]   Fax:  [phone]  Physical Therapy Treatment/ Discharge  PHYSICAL THERAPY DISCHARGE SUMMARY  Visits from Start of Care: 4  Current functional level related to goals / functional outcomes: See below   Remaining deficits: See below   Education / Equipment: See below    Plan: Patient agrees to discharge.  Patient goals were met. Patient is being discharged due to meeting the stated rehab goals.      Patient Details  Name: [name] MRN: [id number] Date of Birth: [birth year] Referring Provider (PT): Pete Pelt, [place]   Encounter Date: 04/23/2021   PT End of Session - 04/23/21 0941     Visit Number 4    Number of Visits 12    Date for PT Re-Evaluation 05/06/21    Authorization Type [employer]    Authorization - Visit Number 4    Authorization - Number of Visits 12    PT Start Time 0849    PT Stop Time 0927    PT Time Calculation (min) 38 min    Activity Tolerance Patient tolerated treatment well    Behavior During Therapy [facility] for tasks assessed/performed             Past Medical History:  Diagnosis Date   Agatston coronary artery calcium score between 200 and 399 10/2020   Coronary CTA showed increased coronary Ca at 237 with minimal CAD in the LCx and RCA.   Erectile dysfunction    Essential hypertension 11/30/2019   Gout    Infected prepatellar bursa, right    Moderate episode of recurrent major depressive disorder ([place]) 08/26/2020   Polyarthritis    Pre-diabetes     Past Surgical History:  Procedure Laterality Date   CHOLECYSTECTOMY     EXCISION OF BACK LESION Left 05/02/2020   Procedure: EXCISION OF BACK LESION, left lower back cyst;  Surgeon: Olean Ree, MD;  Location: [facility] ORS;  Service: General;  Laterality: Left;   IRRIGATION AND DEBRIDEMENT KNEE Right 12/28/2018   Procedure: IRRIGATION AND DEBRIDEMENT KNEE;  Surgeon: Mcarthur Rossetti, MD;  Location: [facility];  Service: Orthopedics;  Laterality: Right;   KNEE SURGERY     ROTATOR CUFF REPAIR     THUMB ARTHROSCOPY Right 2007    There were no vitals filed for this visit.   Subjective Assessment - 04/23/21 0857     Subjective Pt. reports 0/10 pain today and relays that bilat shoulders and neck are feeling good. He reports having a steroid injection in both shoulders, the weather getting warmer, and being compliant with his HEP, he is ready to DC from physical therapy.    Pertinent History Achilles Tendon Surgery (October 2022), Previous L shoulder surgery (1970's), Prediabetes, Essential hypertension, Chronic pain of multiple joints, Moderate episode of recurrent major depressive disorder ([place]), CVD (cardiovascular disease)    Limitations House hold activities;Lifting    Diagnostic tests "Right shoulder 3 views: Shows no acute fractures, Downsloping acromion, No significant arthropathy, Shoulders well located." "Left shoulder 3 views.  Left shoulder is well located.  No significant   arthropathy.  Glenohumeral joint well-maintained.  No acute fractures"    Patient Stated Goals Understand shoulder pain, Decrease pain    Pain Onset More than a month ago                [facility] PT Assessment - 04/23/21 0001       Assessment   Medical  Diagnosis Chronic right shoulder pain, Chronic left shoulder pain    Referring Provider (PT) Pete Pelt, PA-C      Observation/Other Assessments   Focus on Therapeutic Outcomes (FOTO)  75%      AROM   Right Shoulder Flexion --   WNL   Right Shoulder External Rotation --   WNL   Left Shoulder Flexion --   WNL   Left Shoulder External Rotation --   WNL     Strength   Right Shoulder ABduction 5/5    Right Shoulder Internal Rotation 5/5    Right Shoulder External Rotation 5/5    Left Shoulder Flexion 5/5    Left Shoulder Internal Rotation 5/5    Left Shoulder External Rotation 5/5                            [facility] Adult PT Treatment/Exercise - 04/23/21 0001       Shoulder Exercises: Standing   Other Standing Exercises Cable Column: Bilat shoulder rows x 15 10#; bilat shoulder ext x 15 #10; bilat shoulder ER/ IR x 15 #5    Other Standing Exercises Chest press machine #25 2 x 10; Shoulder row machine #25 2 x 10      Shoulder Exercises: ROM/Strengthening   UBE (Upper Arm Bike) Level 3.0 x 6 minutes (3 min forward/ 3 min backward)                       PT Short Term Goals - 04/23/21 0903       PT SHORT TERM GOAL #1   Title Increase AROM R/L shoulder flexion 10 deg.    Time 4    Period Weeks    Status Achieved    Target Date 04/22/21      PT SHORT TERM GOAL #2   Title Increase AROM R/L shoulder abduction 10 deg.    Time 4    Period Weeks    Status Achieved    Target Date 04/22/21      PT SHORT TERM GOAL #3   Title Decrease morning pain 25%    Baseline 10/10 now    Time 4    Period Weeks    Status Achieved    Target Date 04/22/21               PT Long Term Goals - 04/23/21 0904       PT LONG TERM GOAL #1   Title Pt will be I and compliant with HEP and verbalize plan for continued exercise post PT discharge    Time 6    Period Weeks    Status Achieved    Target Date 05/06/21      PT LONG TERM GOAL #2   Title Increase FOTO score to 65%    Baseline 75%    Time 6    Period Weeks    Status Achieved    Target Date 05/06/21      PT LONG TERM GOAL #3   Title Pt will reduce pain to overall less than 4-5/10 with usual activity and work activity.    Time 6    Period Weeks    Status Achieved    Target Date 05/06/21      PT LONG TERM GOAL #4   Title Bilateral shoulder flexion and abduction WFL    Time 6    Period Weeks    Status Achieved  Target Date 05/06/21                   Plan - 04/23/21 0943     Clinical Impression Statement Session consisted of reassessing pt's bilat shoulder measurements as well  as, long-term goals including bilat shoulder ROM, pain and FOTO goals. Pt. still shows slight limitations in L shoulder overhead ROM and strength, due to a previous RTC surgery. Pt.'s HEP was updated today, to prioritize these mild remaining deficits. PT and pt. believe continued progression can be made with independent HEP plan. Pt. will be discharged from physical therapy. We also demonstrated gym equipment that can be used to [employer], when the pt returns to the gym. He had no further questions or concerns regarding DC.    Personal Factors and Comorbidities Comorbidity 3+;Time since onset of injury/illness/exacerbation    Comorbidities Prediabetes, Essential hypertension, Chronic pain of multiple joints, Moderate episode of recurrent major depressive disorder ([place]), CVD (cardiovascular disease)    Examination-Activity Limitations Carry;Reach Overhead;Lift    Examination-Participation Restrictions Occupation;Cleaning;Community Activity    Stability/Clinical Decision Making Evolving/Moderate complexity    Rehab Potential Good    PT Frequency 2x / week    PT Duration 6 weeks    PT Treatment/Interventions ADLs/Self Care Home Management;Cryotherapy;Electrical Stimulation;Iontophoresis 15m/ml Dexamethasone;Moist Heat;Ultrasound;Traction;Therapeutic activities;Therapeutic exercise;Balance training;Stair training;Gait training;Patient/family education;Manual techniques;Energy conservation;Passive range of motion;Dry needling;Vasopneumatic Device;Joint Manipulations;Spinal Manipulations    PT Next Visit Plan DC'd from PT    PT Home Exercise Plan Access Code: [email or web address]   Consulted and Agree with Plan of Care Patient             Patient will benefit from skilled therapeutic intervention in order to improve the following deficits and impairments:  Decreased endurance, Decreased mobility, Hypomobility, Decreased range of motion, Decreased activity tolerance, Decreased strength, Impaired  flexibility, Impaired UE functional use, Pain  Visit Diagnosis: Chronic right shoulder pain  Muscle weakness (generalized)  Cervicalgia  Chronic left shoulder pain     Problem List Patient Active Problem List   Diagnosis Date Noted   Chronic pain of multiple joints 12/27/2020   Agatston coronary artery calcium score between 200 and 399 11/12/2020   CVD (cardiovascular disease) 08/27/2020   [place] 08/26/[place]   Epidermoid cyst of skin of back    Grief reaction 02/09/2020   Influenza vaccine refused 01/17/2020   Hyperlipidemia 12/12/2019   Elevated PSA, less than 10 ng/ml 12/12/2019   Vitamin D deficiency 12/12/2019   Abnormal thyroid blood test 12/12/2019   Essential hypertension 11/30/2019   Subcutaneous nodule of back 11/30/2019   Prepatellar abscess 12/29/2018   Cellulitis of right knee 12/28/2018   Infected prepatellar bursa, right    Prediabetes 07/08/2016   Elevated blood pressure reading without diagnosis of hypertension 06/01/2016   Dental caries 05/21/2015   Thrombocytosis 04/24/2015   Erectile dysfunction 04/23/2015   Polyarthritis 03/08/2015   ROTATOR CUFF INJURY, LEFT SHOULDER 078/29/5621   AWilson Singer Student-PT 04/23/2021, 3:20 PM  [facility] [address] [address]Phone: [phone]  Fax:  [phone] Name: Austin VanhookMRN: [id number] of Birth: 119-Nov-1957

## 2021-05-05 ENCOUNTER — Other Ambulatory Visit: Payer: Self-pay

## 2021-05-05 ENCOUNTER — Ambulatory Visit
Admission: RE | Admit: 2021-05-05 | Discharge: 2021-05-05 | Disposition: A | Discharge status: Home / Self Care | Payer: Medicare HMO | Source: Ambulatory Visit | Attending: Physician Assistant | Admitting: Physician Assistant

## 2021-05-05 ENCOUNTER — Other Ambulatory Visit: Payer: Self-pay | Admitting: Orthopedic Surgery

## 2021-05-05 ENCOUNTER — Other Ambulatory Visit: Payer: Self-pay | Admitting: Physician Assistant

## 2021-05-05 DIAGNOSIS — S82255A Nondisplaced comminuted fracture of shaft of left tibia, initial encounter for closed fracture: Secondary | ICD-10-CM

## 2021-05-05 DIAGNOSIS — I10 Essential (primary) hypertension: Secondary | ICD-10-CM

## 2021-05-05 DIAGNOSIS — M542 Cervicalgia: Secondary | ICD-10-CM

## 2021-05-05 IMAGING — MR MR CERVICAL SPINE W/O CM
4 of 5 series · 27 of 48 positions shown · non-contrast
Comparison: Cervical spine x-rays dated [DATE].

CLINICAL DATA: Chronic neck pain without radiculopathy. No prior
surgery.

EXAM:
MRI CERVICAL SPINE WITHOUT CONTRAST
TECHNIQUE: Multiplanar, multisequence MR imaging of the cervical spine was
performed. No intravenous contrast was administered.

[Series 5: T2 · sagittal · 3.0mm · 0.55mm/px · 6 of 16 slices shown (1 of 2)]
[im 1/16]
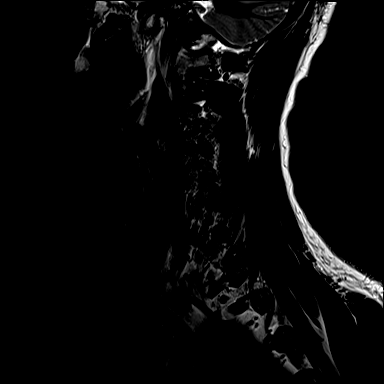
[im 4/16]
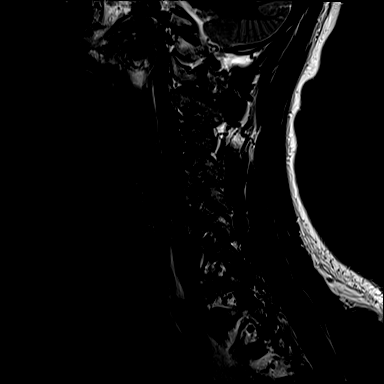
[im 7/16]
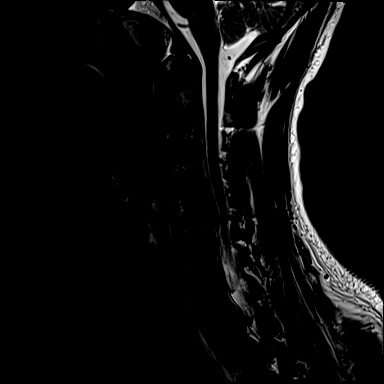
[im 10/16]
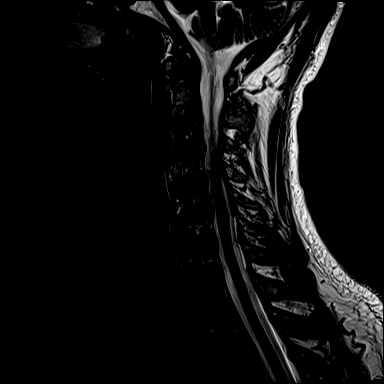
[im 13/16]
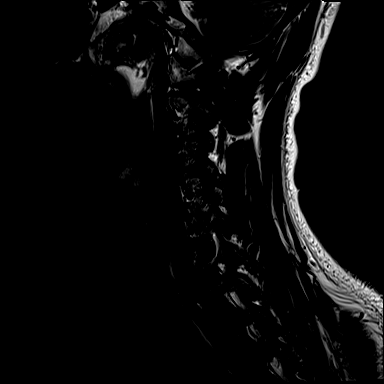
[im 16/16]
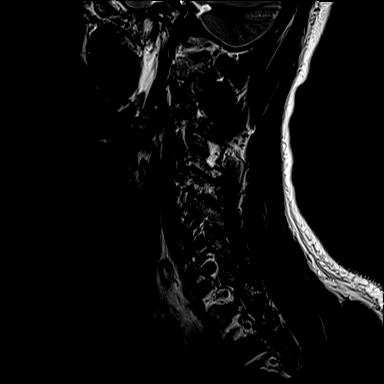

[Series 6: T1 · sagittal · 3.0mm · 0.66mm/px · 7 of 16 slices shown]
[im 1/16]
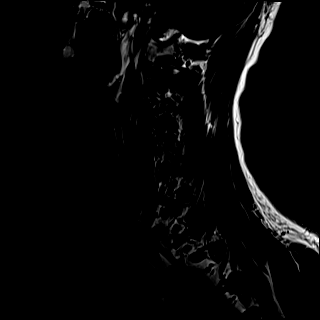
[im 3/16]
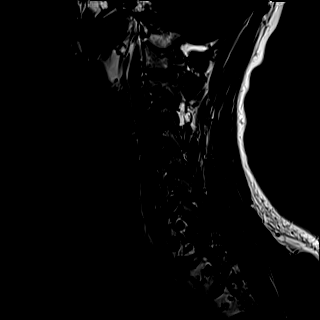
[im 6/16]
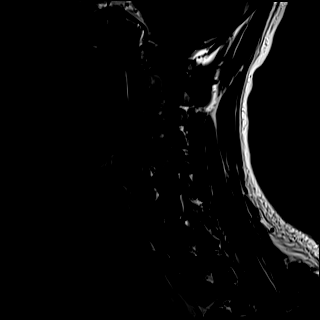
[im 8/16]
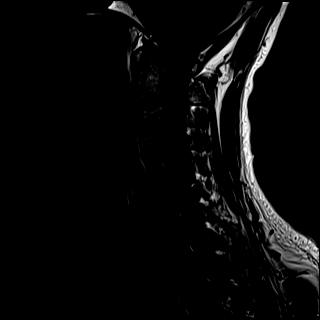
[im 11/16]
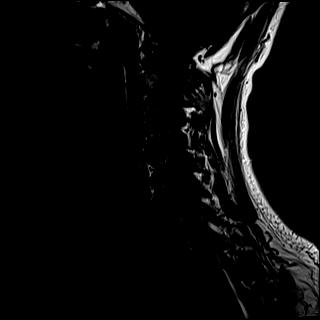
[im 13/16]
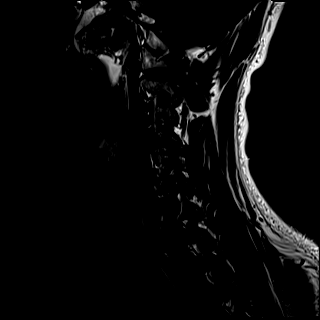
[im 16/16]
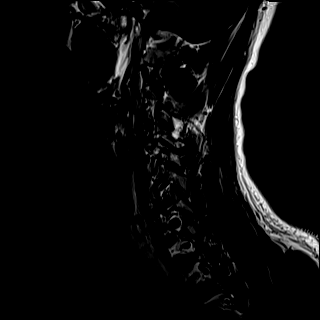

[Series 7: STIR · sagittal · 3.0mm · 0.33mm/px · 6 of 16 slices shown]
[im 1/16]
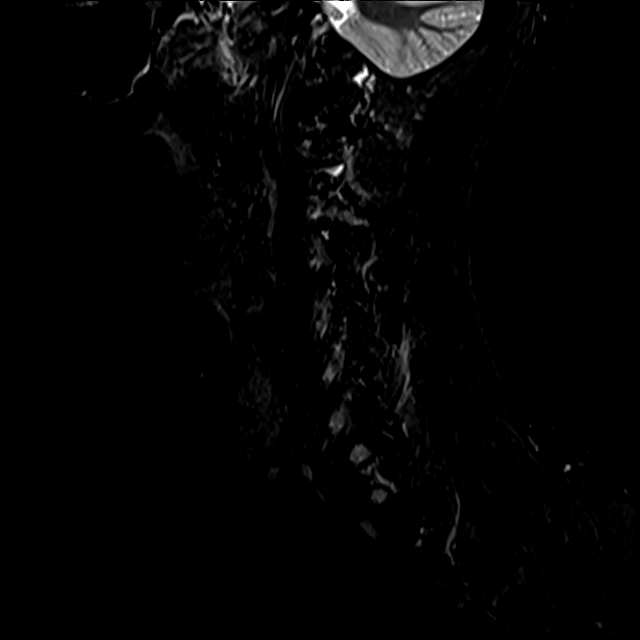
[im 3/16]
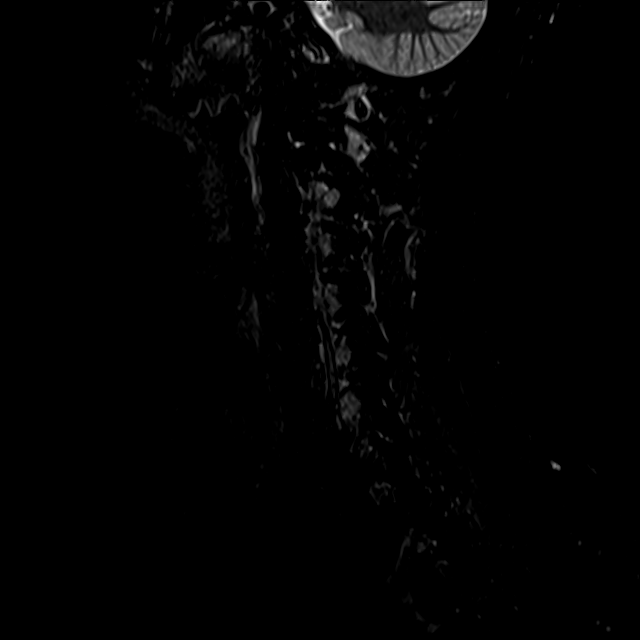
[im 6/16]
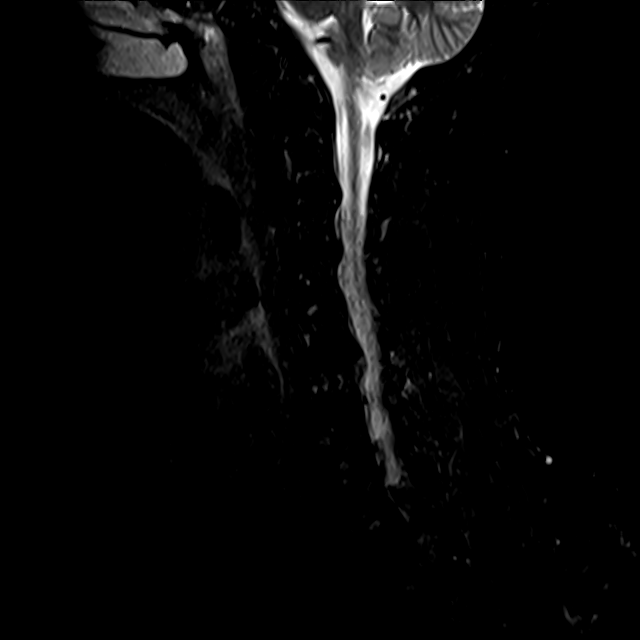
[im 8/16]
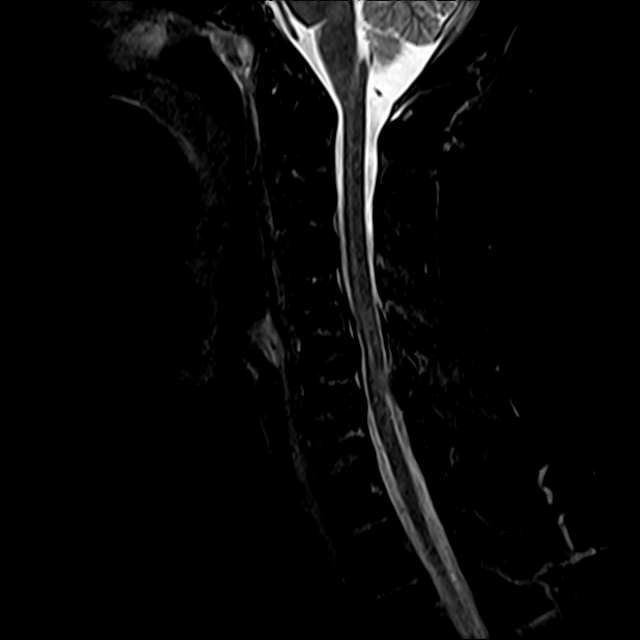
[im 11/16]
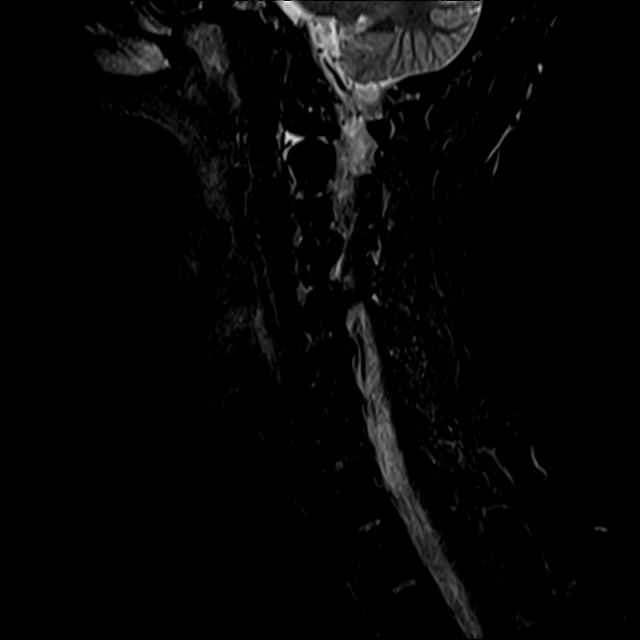
[im 13/16]
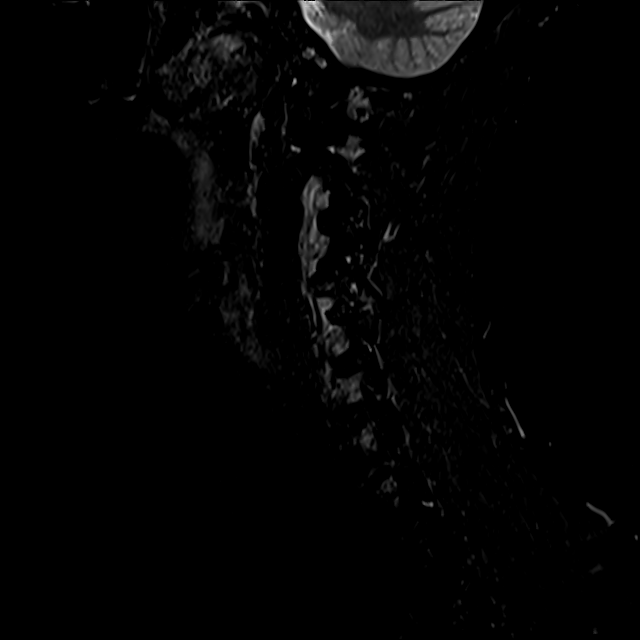

[Series 8: T2 · axial · 3.0mm · 0.50mm/px · z∈[-63,+48]mm · 8 of 35 slices shown (2 of 2)]
[im 1/35]
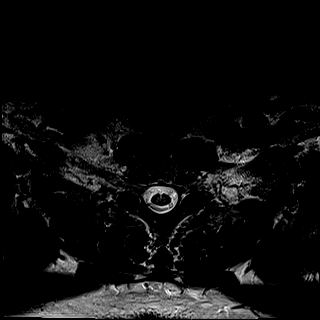
[im 6/35]
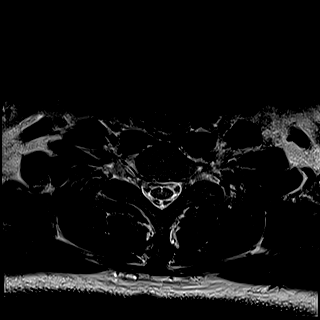
[im 11/35]
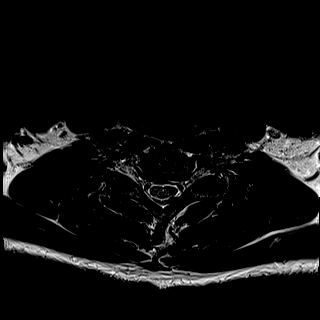
[im 16/35]
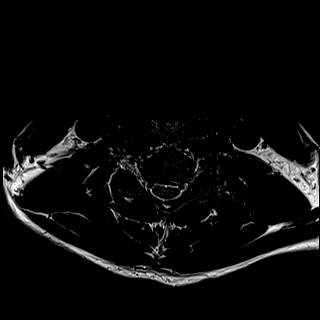
[im 19/35]
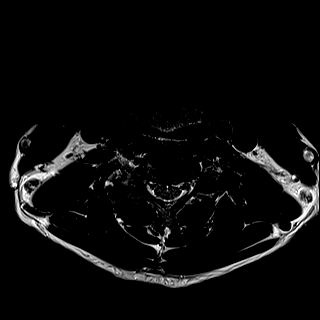
[im 24/35]
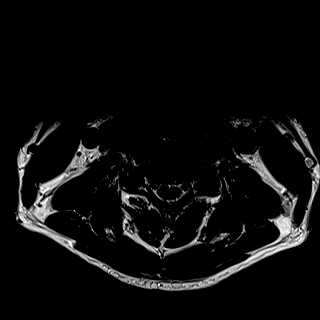
[im 29/35]
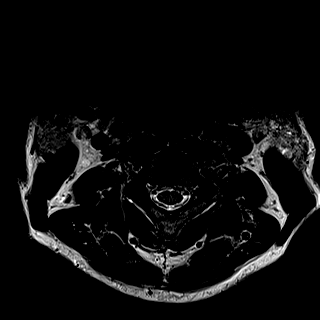
[im 35/35]
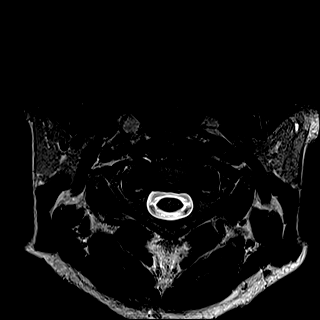

[27 of 48 positions shown; findings below may reference images not displayed]

FINDINGS: Alignment: Mild straightening of the normal cervical lordosis. Trace
anterolisthesis at C4-C5.

Vertebrae: No fracture, evidence of discitis, or bone lesion.

Cord: Normal signal and morphology.

Posterior Fossa, vertebral arteries, paraspinal tissues: Negative.

Disc levels:

C2-C3: Small posterior disc osteophyte complex and mild bilateral
uncovertebral hypertrophy. Mild left greater than right
neuroforaminal stenosis. No spinal canal stenosis.

C3-C4: No significant disc bulge or herniation. Moderate right
uncovertebral hypertrophy. Moderate right greater than left
neuroforaminal stenosis. No spinal canal stenosis.

C4-C5: Tiny left paracentral disc protrusion. Severe right facet
uncovertebral hypertrophy. Severe right and moderate left
neuroforaminal stenosis. No spinal canal stenosis.

C5-C6: Small posterior disc osteophyte complex eccentric to the left
with superimposed small left paracentral disc extrusion migrating
inferiorly. Associated flattening of the left ventral cord. Mild
bilateral uncovertebral hypertrophy. Mild spinal canal and left
neuroforaminal stenosis. No right neuroforaminal stenosis.

C6-C7: Small posterior disc osteophyte complex. Asymmetric
left-sided ligamentum flavum hypertrophy versus calcification with
mass effect on the dorsal left cord. Mild spinal canal stenosis. No
neuroforaminal stenosis.

C7-T1:  Negative.
IMPRESSION: 1. Multilevel degenerative changes of the cervical spine as
described above. Mild spinal canal stenosis at C5-C6 and C6-C7 with
mild mass effect on the left cord.
2. Advanced neuroforaminal stenosis at C3-C4 and C4-C5 due to facet
uncovertebral hypertrophy.

## 2021-05-08 ENCOUNTER — Ambulatory Visit: Payer: Medicare HMO | Admitting: Internal Medicine

## 2021-05-12 ENCOUNTER — Ambulatory Visit: Payer: Medicare HMO | Admitting: Physician Assistant

## 2021-05-19 ENCOUNTER — Ambulatory Visit
Admission: RE | Admit: 2021-05-19 | Discharge: 2021-05-19 | Disposition: A | Discharge status: Home / Self Care | Payer: Medicare HMO | Source: Ambulatory Visit | Attending: Orthopedic Surgery | Admitting: Orthopedic Surgery

## 2021-05-19 DIAGNOSIS — S82255A Nondisplaced comminuted fracture of shaft of left tibia, initial encounter for closed fracture: Secondary | ICD-10-CM

## 2021-05-19 IMAGING — CT CT ANKLE*L* W/O CM
3 series · 10 of 34 positions shown, 12 images · non-contrast
Comparison: Ankle radiograph [DATE]

CLINICAL DATA: Close nondisplaced comminuted fracture of shaft of
left tibia

EXAM:
CT OF THE LEFT ANKLE WITHOUT CONTRAST
TECHNIQUE: Multidetector CT imaging of the left ankle was performed according
to the standard protocol. Multiplanar CT image reconstructions were
also generated.
RADIATION DOSE REDUCTION: This exam was performed according to the
departmental dose-optimization program which includes automated
exposure control, adjustment of the mA and/or kV according to
patient size and/or use of iterative reconstruction technique.

[Series 5: left ankle 2.00 br40 s3 soft · axial · 0.24mm/px · z∈[+563,+675]mm · 2 of 123 slices shown, 3 images (1 of 3)]
[im 38/123  soft-tissue]
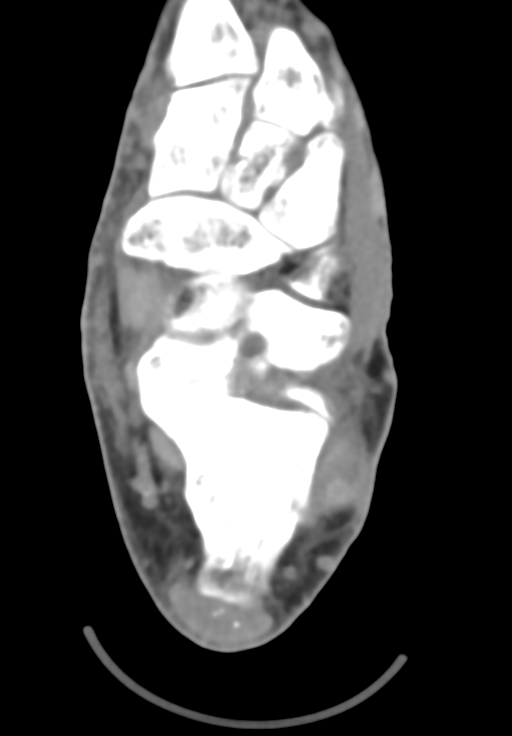
[im 38/123  bone]
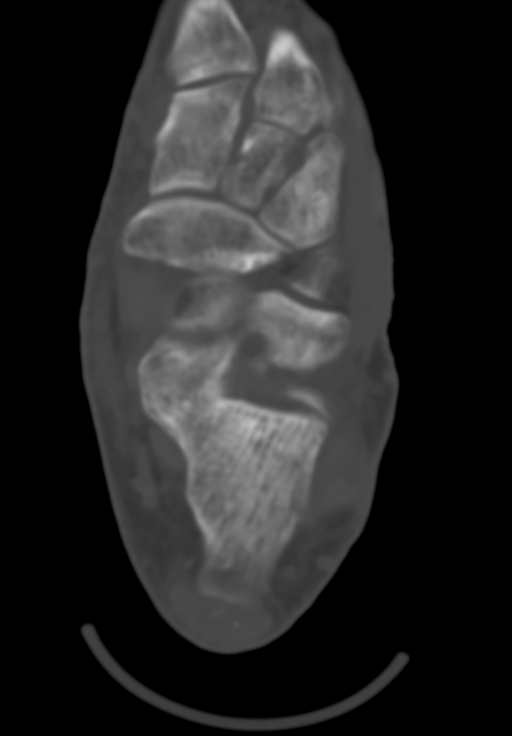
[im 94/123  bone]
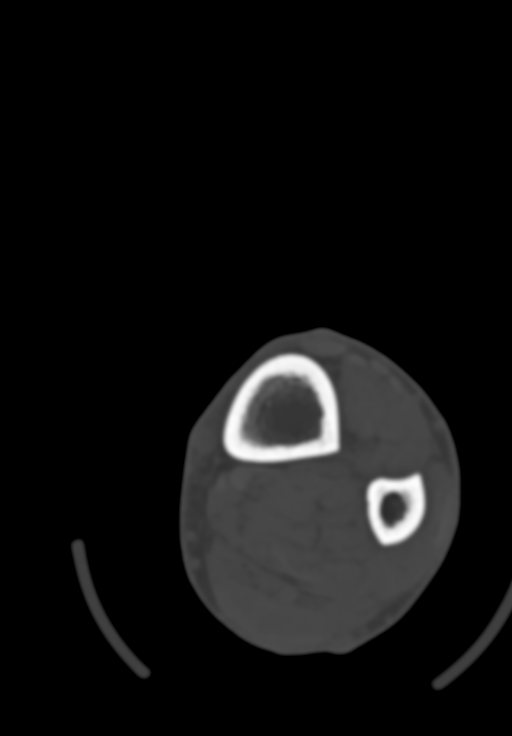

[Series 9: left ankle 2.00 br40 s3 soft · coronal · 0.24mm/px · 3 of 89 slices shown (2 of 3)]
[im 18/89  bone]
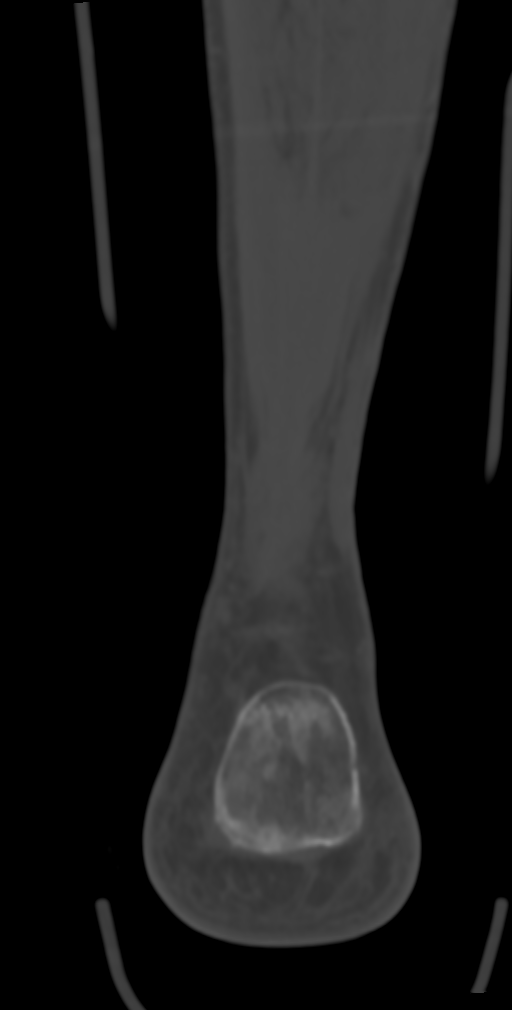
[im 36/89  bone]
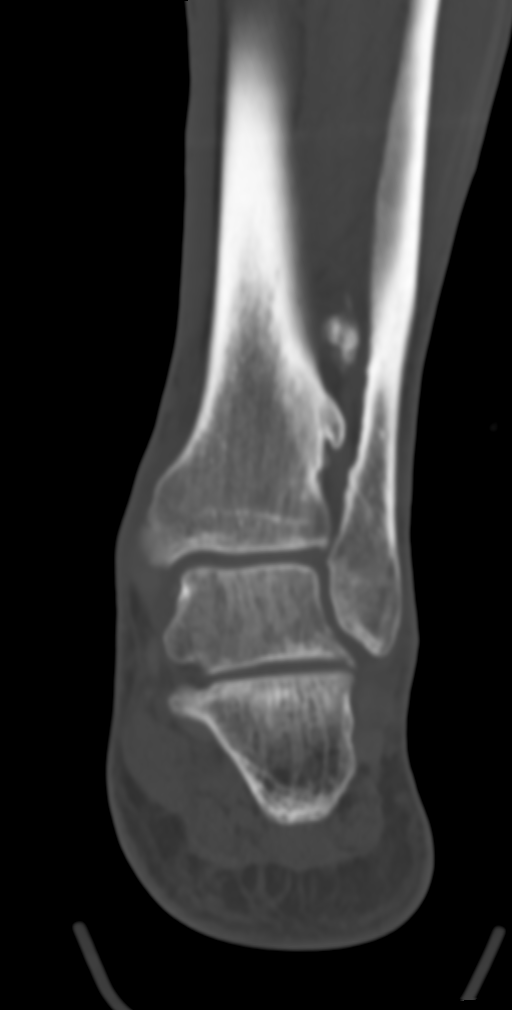
[im 53/89  bone]
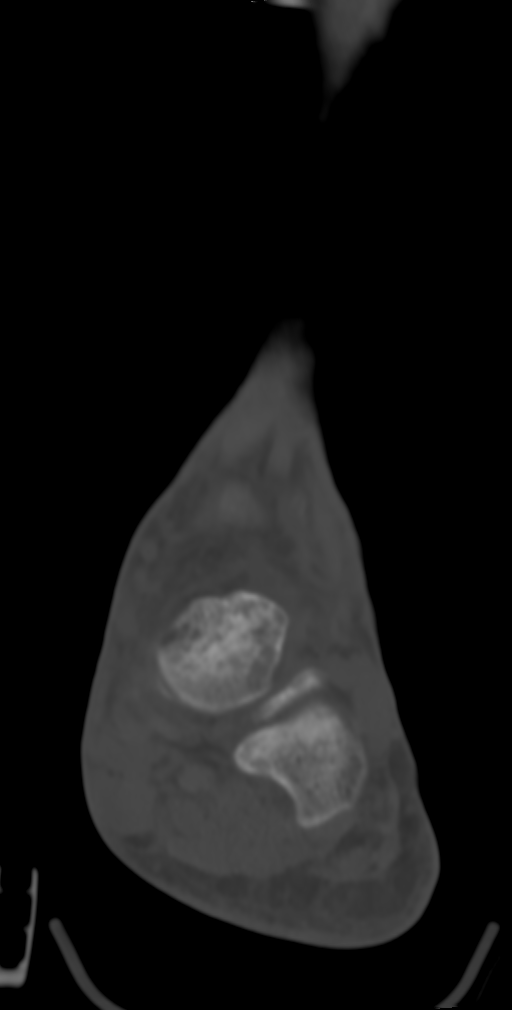

[Series 13: left ankle 2.00 br40 s3 soft · sagittal · 0.35mm/px · 5 of 62 slices shown, 6 images (3 of 3)]
[im 21/62  bone]
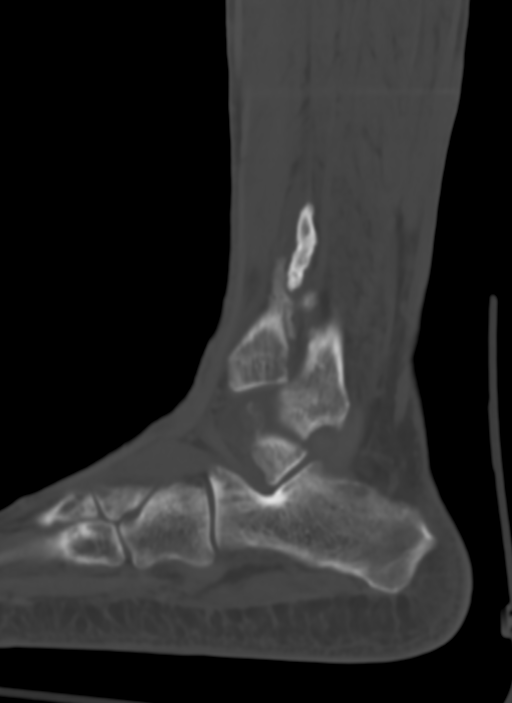
[im 26/62  bone]
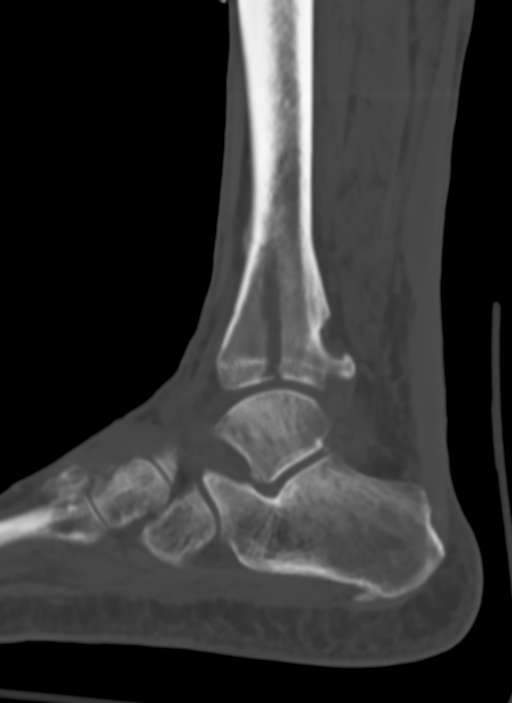
[im 31/62  soft-tissue]
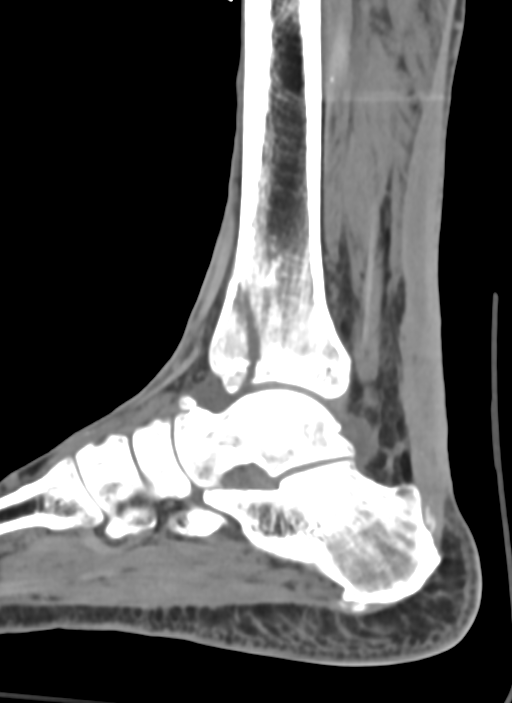
[im 31/62  bone]
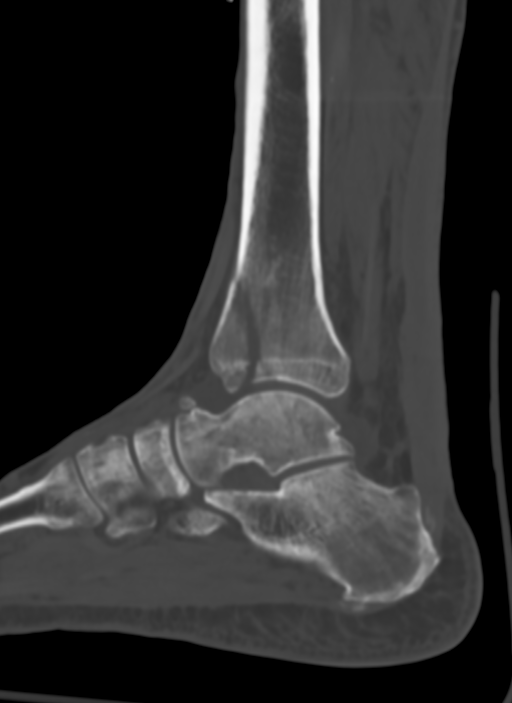
[im 36/62  bone]
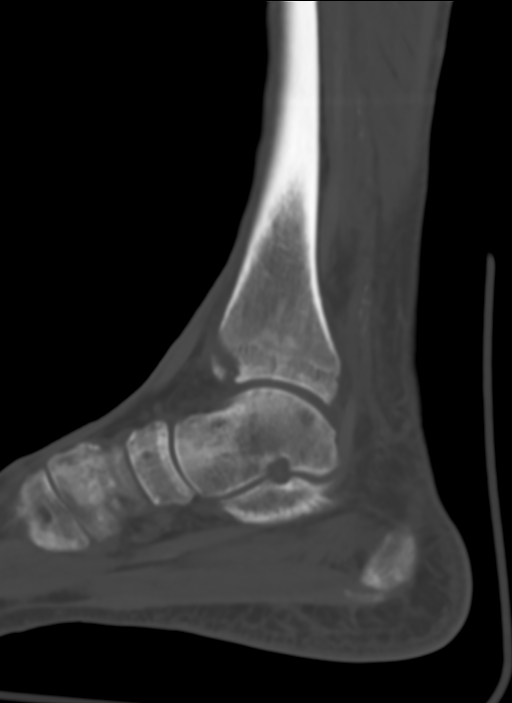
[im 41/62  bone]
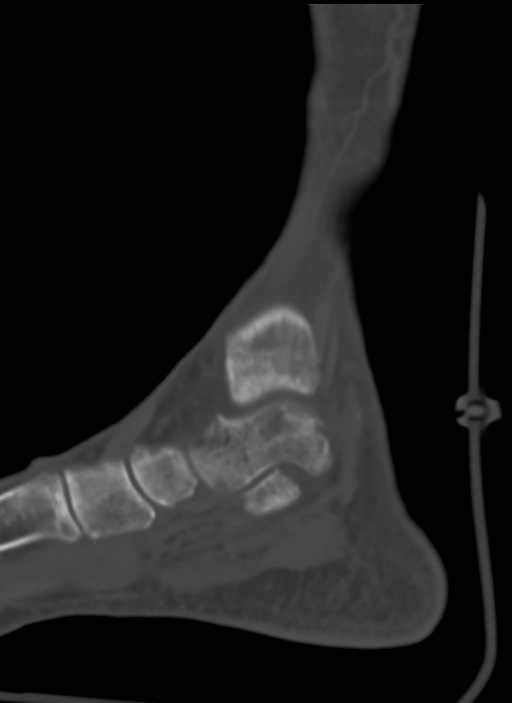

[10 of 34 positions shown; findings below may reference images not displayed]

FINDINGS: Bones/Joint/Cartilage

There is an intra-articular longitudinally oriented fracture along
the anterior aspect of the distal tibia with up to 5 mm anterior
displacement. This extends to the articular surface of the tibial
plafond. There is no evidence of an acute medial or lateral
malleolar fracture. There is no evidence of an additional fracture
in the partially visualized midfoot. Symmetric appearance of the
ankle mortise on this non stress exam.

Mild degenerative changes in the midfoot and hindfoot. Plantar and
dorsal calcaneal spurring. There is chronic syndesmotic
ossification. There is calcification along the lateral posterior
aspect of the sinus tarsi, likely sequela of old injury or
degenerative change.

Ligaments

Suboptimally assessed by CT.

Muscles and Tendons

Markedly thickened Achilles tendon with central hypodensity and
intra tendinous calcification distally at its insertion. Mild
thickening of the central bundle plantar fascia with intrasubstance
calcification. There is a possible split peroneus brevis tendon tear
at and just below the lateral malleolus (series 5, image 87). The
tendon appears intact through its insertion on the base of the fifth
metatarsal.

Soft tissues

Ankle soft tissue swelling.
IMPRESSION: Longitudinally oriented intra-articular fracture of the distal tibia
with up to 5 mm displacement. No other fracture identified.

Possible focal split peroneus brevis tear at and just below the
lateral malleolus.

Severe chronic Achilles tendinosis.

Probable chronic plantar fasciitis involving the proximal central
bundle.

## 2021-07-06 NOTE — Progress Notes (Deleted)
Office Visit Note  Patient: [name]             Date of Birth: November 20, 1955           MRN: [id number]             PCP: Dorna Mai, MD Referring: Pete Pelt, PA-C Visit Date: 07/07/2021 Occupation: '@GUAROCC' @  Subjective:  No chief complaint on file.   History of Present Illness: [name] is a 66 y.o. male here for evaluation of multiple joint pains especially neck and bilateral shoulders. He saw orthopedic surgery clinic for this with subacromial bursa injection and physical therapy. Particularly concerned for inflammatory issue due to normal xray and morning stiffness and soreness pattern. MRI in March showing considerable neural foraminal stenosis otherwise xray imaging unremarkable. ***   Labs reviewed 03/2021 ANA neg RF neg CCP neg ESR wnl CRP wln Vit D 15.1  Activities of Daily Living:  Patient reports morning stiffness for *** {minute/hour:19697}.   Patient {ACTIONS;DENIES/REPORTS:21021675::"Denies"} nocturnal pain.  Difficulty dressing/grooming: {ACTIONS;DENIES/REPORTS:21021675::"Denies"} Difficulty climbing stairs: {ACTIONS;DENIES/REPORTS:21021675::"Denies"} Difficulty getting out of chair: {ACTIONS;DENIES/REPORTS:21021675::"Denies"} Difficulty using hands for taps, buttons, cutlery, and/or writing: {ACTIONS;DENIES/REPORTS:21021675::"Denies"}  No Rheumatology ROS completed.   PMFS History:  Patient Active Problem List   Diagnosis Date Noted   Chronic pain of multiple joints 12/27/2020   Agatston coronary artery calcium score between 200 and 399 11/12/2020   CVD (cardiovascular disease) 08/27/2020   Moderate episode of recurrent major depressive disorder ([place]) 08/26/2020   Epidermoid cyst of skin of back    Grief reaction 02/09/2020   Influenza vaccine refused 01/17/2020   Hyperlipidemia 12/12/2019   Elevated PSA, less than 10 ng/ml 12/12/2019   Vitamin D deficiency 12/12/2019   Abnormal thyroid blood test 12/12/2019   Essential  hypertension 11/30/2019   Subcutaneous nodule of back 11/30/2019   Prepatellar abscess 12/29/2018   Cellulitis of right knee 12/28/2018   Infected prepatellar bursa, right    Prediabetes 07/08/2016   Elevated blood pressure reading without diagnosis of hypertension 06/01/2016   Dental caries 05/21/2015   Thrombocytosis 04/24/2015   Erectile dysfunction 04/23/2015   Polyarthritis 03/08/2015   ROTATOR CUFF INJURY, LEFT SHOULDER 06/20/2009    Past Medical History:  Diagnosis Date   Agatston coronary artery calcium score between 200 and 399 10/2020   Coronary CTA showed increased coronary Ca at 237 with minimal CAD in the LCx and RCA.   Erectile dysfunction    Essential hypertension 11/30/2019   Gout    Infected prepatellar bursa, right    Moderate episode of recurrent major depressive disorder ([place]) 08/26/2020   Polyarthritis    Pre-diabetes     Family History  Problem Relation Age of Onset   Breast cancer Mother    Prostate cancer Father    Hypertension Father    Coronary artery disease Brother 105       MIs with CABG   Coronary artery disease Brother 17       MIs with CABG   Coronary artery disease Brother 87       MIs   Lung cancer Son    Past Surgical History:  Procedure Laterality Date   CHOLECYSTECTOMY     EXCISION OF BACK LESION Left 05/02/2020   Procedure: EXCISION OF BACK LESION, left lower back cyst;  Surgeon: Olean Ree, MD;  Location: [facility];  Service: General;  Laterality: Left;   IRRIGATION AND DEBRIDEMENT KNEE Right 12/28/2018   Procedure: IRRIGATION AND DEBRIDEMENT KNEE;  Surgeon: Mcarthur Rossetti,  MD;  Location: [facility];  Service: Orthopedics;  Laterality: Right;   KNEE SURGERY     ROTATOR CUFF REPAIR     THUMB ARTHROSCOPY Right 2007   Social History   Social History Narrative   Not on file   Immunization History  Administered Date(s) Administered   PFIZER(Purple Top)SARS-COV-2 Vaccination 05/17/2019, 06/07/2019     Objective: Vital  Signs: There were no vitals taken for this visit.   Physical Exam   Musculoskeletal Exam: ***  CDAI Exam: CDAI Score: -- Patient Global: --; Provider Global: -- Swollen: --; Tender: -- Joint Exam 07/07/2021   No joint exam has been documented for this visit   There is currently no information documented on the homunculus. Go to the Rheumatology activity and complete the homunculus joint exam.  Investigation: No additional findings.  Imaging: No results found.  Recent Labs: Lab Results  Component Value Date   WBC 8.9 03/14/2020   HGB 13.6 03/14/2020   PLT 372 03/14/2020   NA 139 10/28/2020   K 4.8 10/28/2020   CL 103 10/28/2020   CO2 24 10/28/2020   GLUCOSE 112 (H) 10/28/2020   BUN 9 10/28/2020   CREATININE 1.01 10/28/2020   BILITOT 0.7 11/30/2019   ALKPHOS 119 11/30/2019   AST 16 11/30/2019   ALT 16 11/18/2020   PROT 7.5 11/30/2019   ALBUMIN 4.5 11/30/2019   CALCIUM 10.0 10/28/2020   GFRAA 101 03/14/2020    Speciality Comments: No specialty comments available.  Procedures:  No procedures performed Allergies: Patient has no known allergies.   Assessment / Plan:     Visit Diagnoses: No diagnosis found.  Orders: No orders of the defined types were placed in this encounter.  No orders of the defined types were placed in this encounter.   Face-to-face time spent with patient was *** minutes. Greater than 50% of time was spent in counseling and coordination of care.  Follow-Up Instructions: No follow-ups on file.   Collier Salina, MD  Note - This record has been created using [employer].  Chart creation errors have been sought, but may not always  have been located. Such creation errors do not reflect on  the standard of medical care.

## 2021-07-07 ENCOUNTER — Ambulatory Visit: Payer: Medicare HMO | Admitting: Internal Medicine

## 2021-07-07 ENCOUNTER — Ambulatory Visit (AMBULATORY_SURGERY_CENTER): Payer: Medicare HMO | Admitting: *Deleted

## 2021-07-07 VITALS — Ht 67.5 in | Wt 194.0 lb

## 2021-07-07 DIAGNOSIS — Z1211 Encounter for screening for malignant neoplasm of colon: Secondary | ICD-10-CM

## 2021-07-07 MED ORDER — PEG 3350-KCL-NA BICARB-NACL 420 G PO SOLR: 4000.0000 mL | Freq: Once | ORAL | 0 refills | Status: AC

## 2021-07-07 NOTE — Progress Notes (Signed)

## 2021-07-09 ENCOUNTER — Other Ambulatory Visit: Payer: Self-pay | Admitting: Family Medicine

## 2021-07-09 DIAGNOSIS — I10 Essential (primary) hypertension: Secondary | ICD-10-CM

## 2021-08-05 ENCOUNTER — Encounter: Payer: Medicare HMO | Admitting: Gastroenterology

## 2021-09-10 ENCOUNTER — Encounter: Payer: Self-pay | Admitting: Gastroenterology

## 2021-09-11 ENCOUNTER — Telehealth: Payer: Self-pay | Admitting: Gastroenterology

## 2021-09-11 NOTE — Telephone Encounter (Signed)
If this falls in the range for charge for late [facility] cancellation please charge.

## 2021-09-11 NOTE — Telephone Encounter (Signed)
Good Afternoon Dr. Fuller Plan,   Patient called stating that he wanted to cancel his colonoscopy on 7/17 at 11:30  due to he does not feel comfortable having procedure at this time.  Patient stated he would call back at a later time to reschedule.

## 2021-09-15 ENCOUNTER — Encounter: Payer: Medicare HMO | Admitting: Gastroenterology

## 2021-10-13 ENCOUNTER — Other Ambulatory Visit: Payer: Self-pay | Admitting: Family Medicine

## 2021-10-13 DIAGNOSIS — I10 Essential (primary) hypertension: Secondary | ICD-10-CM

## 2021-11-21 ENCOUNTER — Other Ambulatory Visit: Payer: Self-pay | Admitting: Family Medicine

## 2021-11-21 DIAGNOSIS — I10 Essential (primary) hypertension: Secondary | ICD-10-CM

## 2021-12-02 ENCOUNTER — Ambulatory Visit
Admission: EM | Admit: 2021-12-02 | Discharge: 2021-12-02 | Disposition: A | Discharge status: Home / Self Care | Payer: Medicare HMO | Attending: Internal Medicine | Admitting: Internal Medicine

## 2021-12-02 ENCOUNTER — Ambulatory Visit (INDEPENDENT_AMBULATORY_CARE_PROVIDER_SITE_OTHER): Payer: Medicare HMO

## 2021-12-02 DIAGNOSIS — J069 Acute upper respiratory infection, unspecified: Secondary | ICD-10-CM | POA: Diagnosis not present

## 2021-12-02 DIAGNOSIS — R059 Cough, unspecified: Secondary | ICD-10-CM | POA: Diagnosis not present

## 2021-12-02 DIAGNOSIS — R0602 Shortness of breath: Secondary | ICD-10-CM

## 2021-12-02 MED ORDER — ALBUTEROL SULFATE HFA 108 (90 BASE) MCG/ACT IN AERS: 1.0000 | INHALATION_SPRAY | Freq: Four times a day (QID) | RESPIRATORY_TRACT | 0 refills | Status: AC | PRN

## 2021-12-02 MED ORDER — FLUTICASONE PROPIONATE 50 MCG/ACT NA SUSP: 1.0000 | Freq: Every day | NASAL | 0 refills | Status: AC

## 2021-12-02 MED ORDER — BENZONATATE 100 MG PO CAPS: 100.0000 mg | ORAL_CAPSULE | Freq: Three times a day (TID) | ORAL | 0 refills | Status: DC | PRN

## 2021-12-02 NOTE — Discharge Instructions (Signed)
It appears that you have a viral illness that should run its course and self resolve with symptomatic treatment.  I have prescribed you 3 medications including an inhaler that should help alleviate symptoms.  Your chest x-ray was normal.  Please follow-up if symptoms persist or worsen.

## 2021-12-02 NOTE — ED Triage Notes (Signed)
Pt c/o of chest congestion, headache, and dry cough, and sore throat

## 2021-12-02 NOTE — ED Provider Notes (Signed)
[facility]    CSN: [id number] Arrival date & time: 12/02/21  1001      History   Chief Complaint Chief Complaint  Patient presents with   Nasal Congestion    HPI [name] is a 66 y.o. male.   Patient presents with headache, dry cough, sore throat, nasal congestion, feelings of chest congestion that started about 1 week ago.  Patient reports that he has been having some intermittent shortness of breath as well.  Denies any known fevers at home.  Denies any known sick contacts.  Patient has been taking over-the-counter cold and flu medication with minimal improvement in symptoms.  Denies any history of formal diagnosis of COPD or asthma.  Denies chest pain nausea, vomiting, diarrhea, abdominal pain.  Patient reports that symptoms have seemed to improved about 2 days ago, and then returned and were worsened.     Past Medical History:  Diagnosis Date   Agatston coronary artery calcium score between 200 and 399 10/2020   Coronary CTA showed increased coronary Ca at 237 with minimal CAD in the LCx and RCA.   Erectile dysfunction    Essential hypertension 11/30/2019   Gout    Infected prepatellar bursa, right    Moderate episode of recurrent major depressive disorder ([place]) 08/26/2020   denies updated 07/07/21   Polyarthritis    pt denies,updated 07/07/21   Pre-diabetes     Patient Active Problem List   Diagnosis Date Noted   Chronic pain of multiple joints 12/27/2020   Agatston coronary artery calcium score between 200 and 399 11/12/2020   CVD (cardiovascular disease) 08/27/2020   Moderate episode of recurrent major depressive disorder ([place]) 08/26/2020   Epidermoid cyst of skin of back    Grief reaction 02/09/2020   Influenza vaccine refused 01/17/2020   Hyperlipidemia 12/12/2019   Elevated PSA, less than 10 ng/ml 12/12/2019   Vitamin D deficiency 12/12/2019   Abnormal thyroid blood test 12/12/2019   Essential hypertension 11/30/2019   Subcutaneous nodule  of back 11/30/2019   Prepatellar abscess 12/29/2018   Cellulitis of right knee 12/28/2018   Infected prepatellar bursa, right    Prediabetes 07/08/2016   Elevated blood pressure reading without diagnosis of hypertension 06/01/2016   Dental caries 05/21/2015   Thrombocytosis 04/24/2015   Erectile dysfunction 04/23/2015   Polyarthritis 03/08/2015   ROTATOR CUFF INJURY, LEFT SHOULDER 06/20/2009    Past Surgical History:  Procedure Laterality Date   ACHILLES TENDON REPAIR     09/2020   CHOLECYSTECTOMY     EXCISION OF BACK LESION Left 05/02/2020   Procedure: EXCISION OF BACK LESION, left lower back cyst;  Surgeon: Olean Ree, MD;  Location: [facility];  Service: General;  Laterality: Left;   IRRIGATION AND DEBRIDEMENT KNEE Right 12/28/2018   Procedure: IRRIGATION AND DEBRIDEMENT KNEE;  Surgeon: Mcarthur Rossetti, MD;  Location: [facility];  Service: Orthopedics;  Laterality: Right;   KNEE SURGERY Right    1976   ROTATOR CUFF REPAIR     1978   THUMB ARTHROSCOPY Right 2007       Home Medications    Prior to Admission medications   Medication Sig Start Date End Date Taking? Authorizing Provider  albuterol (VENTOLIN HFA) 108 (90 Base) MCG/ACT inhaler Inhale 1-2 puffs into the lungs every 6 (six) hours as needed for wheezing or shortness of breath. 12/02/21  Yes Glorie Dowlen, Hildred Alamin E, FNP  benzonatate (TESSALON) 100 MG capsule Take 1 capsule (100 mg total) by mouth every 8 (eight)  hours as needed for cough. 12/02/21  Yes [place] Fanfan, Hildred Alamin E, FNP  fluticasone (FLONASE) 50 MCG/ACT nasal spray Place 1 spray into both nostrils daily for 3 days. 12/02/21 12/05/21 Yes Teodora Medici, FNP  Accu-Chek Softclix Lancets lancets Use as instructed Patient not taking: Reported on 07/07/2021 03/11/21   Mayers, Loraine Grip, PA-C  acetaminophen (TYLENOL) 500 MG tablet Take 2 tablets (1,000 mg total) by mouth every 6 (six) hours as needed for mild pain. 05/02/20   Olean Ree, MD  aspirin EC 81 MG tablet Take 1 tablet (81  mg total) by mouth daily. Swallow whole. 11/15/20   Sueanne Margarita, MD  DULoxetine (CYMBALTA) 30 MG capsule Take 1 capsule (30 mg total) by mouth daily. Patient not taking: Reported on 07/07/2021 03/11/21   Mayers, Cari S, PA-C  glucose blood (ACCU-CHEK AVIVA PLUS) test strip Use as instructed Patient not taking: Reported on 07/07/2021 03/11/21   Mayers, Cari S, PA-C  ibuprofen (ADVIL) 600 MG tablet Take 1 tablet (600 mg total) by mouth every 8 (eight) hours as needed for moderate pain. 05/02/20   Olean Ree, MD  lisinopril (ZESTRIL) 10 MG tablet Take 1 tablet (10 mg total) by mouth daily. 11/21/21   Dorna Mai, MD  metFORMIN (GLUCOPHAGE XR) 500 MG 24 hr tablet Take 1 tablet (500 mg total) by mouth daily with breakfast. Patient not taking: Reported on 07/07/2021 03/11/21   Mayers, Cari S, PA-C  Vitamin D, Ergocalciferol, (DRISDOL) 1.25 MG (50000 UNIT) CAPS capsule Take 1 capsule (50,000 Units total) by mouth every 7 (seven) days. 03/12/21   Mayers, Loraine Grip, PA-C    Family History Family History  Problem Relation Age of Onset   Breast cancer Mother    Prostate cancer Father    Hypertension Father    Coronary artery disease Brother 19       MIs with CABG   Coronary artery disease Brother 25       MIs with CABG   Coronary artery disease Brother 45       MIs   Lung cancer Son    Colon cancer Neg Hx    Colon polyps Neg Hx    Crohn's disease Neg Hx    Esophageal cancer Neg Hx    Rectal cancer Neg Hx    Stomach cancer Neg Hx     Social History Social History   Tobacco Use   Smoking status: Never   Smokeless tobacco: Never  Vaping Use   Vaping Use: Never used  Substance Use Topics   Alcohol use: No    Alcohol/week: 0.0 standard drinks of alcohol   Drug use: No     Allergies   Patient has no known allergies.   Review of Systems Review of Systems Per HPI  Physical Exam Triage Vital Signs ED Triage Vitals  Enc Vitals Group     BP 12/02/21 1011 (!) 142/91     Pulse Rate  12/02/21 1011 70     Resp --      Temp 12/02/21 1011 98.2 F (36.8 C)     Temp src --      SpO2 12/02/21 1011 96 %     Weight --      Height --      Head Circumference --      Peak Flow --      Pain Score 12/02/21 1013 0     Pain Loc --      Pain Edu? --  Excl. in GC? --    No data found.  Updated Vital Signs BP (!) 142/91 (BP Location: Left Arm)   Pulse 70   Temp 98.2 F (36.8 C)   SpO2 96%   Visual Acuity Right Eye Distance:   Left Eye Distance:   Bilateral Distance:    Right Eye Near:   Left Eye Near:    Bilateral Near:     Physical Exam Constitutional:      General: He is not in acute distress.    Appearance: Normal appearance. He is not toxic-appearing or diaphoretic.  HENT:     Head: Normocephalic and atraumatic.     Right Ear: Tympanic membrane and ear canal normal.     Left Ear: Tympanic membrane and ear canal normal.     Nose: Congestion present.     Mouth/Throat:     Mouth: Mucous membranes are moist.     Pharynx: No posterior oropharyngeal erythema.  Eyes:     Extraocular Movements: Extraocular movements intact.     Conjunctiva/sclera: Conjunctivae normal.     Pupils: Pupils are equal, round, and reactive to light.  Cardiovascular:     Rate and Rhythm: Normal rate and regular rhythm.     Pulses: Normal pulses.     Heart sounds: Normal heart sounds.  Pulmonary:     Effort: Pulmonary effort is normal. No respiratory distress.     Breath sounds: Normal breath sounds. No stridor. No wheezing, rhonchi or rales.  Abdominal:     General: Abdomen is flat. Bowel sounds are normal.     Palpations: Abdomen is soft.  Musculoskeletal:        General: Normal range of motion.     Cervical back: Normal range of motion.  Skin:    General: Skin is warm and dry.  Neurological:     General: No focal deficit present.     Mental Status: He is alert and oriented to person, place, and time. Mental status is at baseline.  Psychiatric:        Mood and Affect:  Mood normal.        Behavior: Behavior normal.      UC Treatments / Results  Labs (all labs ordered are listed, but only abnormal results are displayed) Labs Reviewed - No data to display  EKG   Radiology DG Chest 2 View  Result Date: 12/02/2021 CLINICAL DATA:  Provided history: Cough, shortness of breath. Chest congestion, headache, sore throat. EXAM: CHEST - 2 VIEW COMPARISON:  Prior chest radiographs 12/22/2019 and earlier. FINDINGS: Somewhat shallow inspiration radiograph. Heart size within normal limits. No appreciable airspace consolidation. No evidence of pleural effusion or pneumothorax. No acute bony abnormality identified. Surgical clips within the right upper quadrant of the abdomen. IMPRESSION: No evidence of active cardiopulmonary disease. Electronically Signed   By: Kellie Simmering D.O.   On: 12/02/2021 11:11    Procedures Procedures (including critical care time)  Medications Ordered in UC Medications - No data to display  Initial Impression / Assessment and Plan / UC Course  I have reviewed the triage vital signs and the nursing notes.  Pertinent labs & imaging results that were available during my care of the patient were reviewed by me and considered in my medical decision making (see chart for details).     Patient presents with symptoms likely from a viral upper respiratory infection. Differential includes bacterial pneumonia, sinusitis, allergic rhinitis, covid 19, flu, RSV. Symptoms seem unlikely related to ACS, CHF or  COPD exacerbations, pneumonia, pneumothorax. Patient is nontoxic appearing and not in need of emergent medical intervention.  Chest x-ray was negative for any acute cardiopulmonary process.  Do not think viral testing is necessary given duration of symptoms would not change treatment.  Albuterol inhaler prescribed for patient to help alleviate intermittent shortness of breath.  Patient also prescribed other medications to help alleviate  symptoms.  Return if symptoms fail to improve. Patient states understanding and is agreeable.  Discharged with PCP followup.  Final Clinical Impressions(s) / UC Diagnoses   Final diagnoses:  Viral upper respiratory tract infection with cough     Discharge Instructions      It appears that you have a viral illness that should run its course and self resolve with symptomatic treatment.  I have prescribed you 3 medications including an inhaler that should help alleviate symptoms.  Your chest x-ray was normal.  Please follow-up if symptoms persist or worsen.     ED Prescriptions     Medication Sig Dispense Auth. Provider   albuterol (VENTOLIN HFA) 108 (90 Base) MCG/ACT inhaler Inhale 1-2 puffs into the lungs every 6 (six) hours as needed for wheezing or shortness of breath. 1 each [place], Hildred Alamin E, FNP   fluticasone [facility]) 50 MCG/ACT nasal spray Place 1 spray into both nostrils daily for 3 days. 16 g Zeta Bucy, Hildred Alamin E, [place]   benzonatate (TESSALON) 100 MG capsule Take 1 capsule (100 mg total) by mouth every 8 (eight) hours as needed for cough. 21 capsule [place], Michele Rockers, [place]      PDMP not reviewed this encounter.   Teodora Medici, [place] 12/02/21 [phone]

## 2022-02-11 ENCOUNTER — Ambulatory Visit (INDEPENDENT_AMBULATORY_CARE_PROVIDER_SITE_OTHER): Payer: Medicare HMO

## 2022-02-11 ENCOUNTER — Other Ambulatory Visit: Payer: Self-pay | Admitting: Family Medicine

## 2022-02-11 ENCOUNTER — Ambulatory Visit
Admission: EM | Admit: 2022-02-11 | Discharge: 2022-02-11 | Disposition: A | Discharge status: Home / Self Care | Payer: Medicare HMO | Attending: Physician Assistant | Admitting: Physician Assistant

## 2022-02-11 DIAGNOSIS — R059 Cough, unspecified: Secondary | ICD-10-CM | POA: Diagnosis not present

## 2022-02-11 DIAGNOSIS — I10 Essential (primary) hypertension: Secondary | ICD-10-CM

## 2022-02-11 DIAGNOSIS — J209 Acute bronchitis, unspecified: Secondary | ICD-10-CM | POA: Diagnosis not present

## 2022-02-11 MED ORDER — PREDNISONE 20 MG PO TABS: 40.0000 mg | ORAL_TABLET | Freq: Every day | ORAL | 0 refills | Status: AC

## 2022-02-11 NOTE — ED Provider Notes (Signed)
[name]    CSN: [id number] Arrival date & time: 02/11/22  1107      History   Chief Complaint No chief complaint on file.   HPI [name] is a 66 y.o. male.   HPI  Past Medical History:  Diagnosis Date   Agatston coronary artery calcium score between 200 and 399 10/2020   Coronary CTA showed increased coronary Ca at 237 with minimal CAD in the LCx and RCA.   Erectile dysfunction    Essential hypertension 11/30/2019   Gout    Infected prepatellar bursa, right    Moderate episode of recurrent major depressive disorder ([place]) 08/26/2020   denies updated 07/07/21   Polyarthritis    pt denies,updated 07/07/21   Pre-diabetes     Patient Active Problem List   Diagnosis Date Noted   Chronic pain of multiple joints 12/27/2020   Agatston coronary artery calcium score between 200 and 399 11/12/2020   CVD (cardiovascular disease) 08/27/2020   Moderate episode of recurrent major depressive disorder ([place]) 08/26/2020   Epidermoid cyst of skin of back    Grief reaction 02/09/2020   Influenza vaccine refused 01/17/2020   Hyperlipidemia 12/12/2019   Elevated PSA, less than 10 ng/ml 12/12/2019   Vitamin D deficiency 12/12/2019   Abnormal thyroid blood test 12/12/2019   Essential hypertension 11/30/2019   Subcutaneous nodule of back 11/30/2019   Prepatellar abscess 12/29/2018   Cellulitis of right knee 12/28/2018   Infected prepatellar bursa, right    Prediabetes 07/08/2016   Elevated blood pressure reading without diagnosis of hypertension 06/01/2016   Dental caries 05/21/2015   Thrombocytosis 04/24/2015   Erectile dysfunction 04/23/2015   Polyarthritis 03/08/2015   ROTATOR CUFF INJURY, LEFT SHOULDER 06/20/2009    Past Surgical History:  Procedure Laterality Date   ACHILLES TENDON REPAIR     09/2020   CHOLECYSTECTOMY     EXCISION OF BACK LESION Left 05/02/2020   Procedure: EXCISION OF BACK LESION, left lower back cyst;  Surgeon: Olean Ree, MD;   Location: [facility];  Service: General;  Laterality: Left;   IRRIGATION AND DEBRIDEMENT KNEE Right 12/28/2018   Procedure: IRRIGATION AND DEBRIDEMENT KNEE;  Surgeon: Mcarthur Rossetti, MD;  Location: [facility];  Service: Orthopedics;  Laterality: Right;   KNEE SURGERY Right    1976   ROTATOR CUFF REPAIR     1978   THUMB ARTHROSCOPY Right 2007       Home Medications    Prior to Admission medications   Medication Sig Start Date End Date Taking? Authorizing Provider  Accu-Chek Softclix Lancets lancets Use as instructed Patient not taking: Reported on 07/07/2021 03/11/21   Mayers, Cari S, PA-C  acetaminophen (TYLENOL) 500 MG tablet Take 2 tablets (1,000 mg total) by mouth every 6 (six) hours as needed for mild pain. 05/02/20   Piscoya, Jacqulyn Bath, MD  albuterol (VENTOLIN HFA) 108 (90 Base) MCG/ACT inhaler Inhale 1-2 puffs into the lungs every 6 (six) hours as needed for wheezing or shortness of breath. 12/02/21   Teodora Medici, FNP  aspirin EC 81 MG tablet Take 1 tablet (81 mg total) by mouth daily. Swallow whole. 11/15/20   Sueanne Margarita, MD  benzonatate (TESSALON) 100 MG capsule Take 1 capsule (100 mg total) by mouth every 8 (eight) hours as needed for cough. 12/02/21   Teodora Medici, FNP  DULoxetine (CYMBALTA) 30 MG capsule Take 1 capsule (30 mg total) by mouth daily. Patient not taking: Reported on 07/07/2021 03/11/21  Mayers, Cari S, PA-C  fluticasone (FLONASE) 50 MCG/ACT nasal spray Place 1 spray into both nostrils daily for 3 days. 12/02/21 12/05/21  Teodora Medici, FNP  glucose blood (ACCU-CHEK AVIVA PLUS) test strip Use as instructed Patient not taking: Reported on 07/07/2021 03/11/21   Mayers, Cari S, PA-C  ibuprofen (ADVIL) 600 MG tablet Take 1 tablet (600 mg total) by mouth every 8 (eight) hours as needed for moderate pain. 05/02/20   Olean Ree, MD  lisinopril (ZESTRIL) 10 MG tablet Take 1 tablet (10 mg total) by mouth daily. 11/21/21   Dorna Mai, MD  metFORMIN (GLUCOPHAGE XR) 500 MG 24  hr tablet Take 1 tablet (500 mg total) by mouth daily with breakfast. Patient not taking: Reported on 07/07/2021 03/11/21   Mayers, Cari S, PA-C  Vitamin D, Ergocalciferol, (DRISDOL) 1.25 MG (50000 UNIT) CAPS capsule Take 1 capsule (50,000 Units total) by mouth every 7 (seven) days. 03/12/21   Mayers, Loraine Grip, PA-C    Family History Family History  Problem Relation Age of Onset   Breast cancer Mother    Prostate cancer Father    Hypertension Father    Coronary artery disease Brother 57       MIs with CABG   Coronary artery disease Brother 4       MIs with CABG   Coronary artery disease Brother 39       MIs   Lung cancer Son    Colon cancer Neg Hx    Colon polyps Neg Hx    Crohn's disease Neg Hx    Esophageal cancer Neg Hx    Rectal cancer Neg Hx    Stomach cancer Neg Hx     Social History Social History   Tobacco Use   Smoking status: Never   Smokeless tobacco: Never  Vaping Use   Vaping Use: Never used  Substance Use Topics   Alcohol use: No    Alcohol/week: 0.0 standard drinks of alcohol   Drug use: No     Allergies   Patient has no known allergies.   Review of Systems Review of Systems  Constitutional:  Positive for chills. Negative for fever.  HENT:  Positive for congestion and sore throat. Negative for ear pain.   Eyes:  Negative for discharge and redness.  Respiratory:  Positive for cough. Negative for shortness of breath.   Gastrointestinal:  Negative for abdominal pain, nausea and vomiting.     Physical Exam Triage Vital Signs ED Triage Vitals  Enc Vitals Group     BP      Pulse      Resp      Temp      Temp src      SpO2      Weight      Height      Head Circumference      Peak Flow      Pain Score      Pain Loc      Pain Edu?      Excl. in [place]?    No data found.  Updated Vital Signs There were no vitals taken for this visit.     Physical Exam Vitals and nursing note reviewed.  Constitutional:      General: He is not in acute  distress.    Appearance: Normal appearance. He is not ill-appearing.  HENT:     Head: Normocephalic and atraumatic.     Right Ear: Tympanic membrane normal.  Left Ear: Tympanic membrane normal.     Nose: Nose normal. No congestion.     Mouth/Throat:     Mouth: Mucous membranes are moist.     Pharynx: Oropharynx is clear. No oropharyngeal exudate or posterior oropharyngeal erythema.  Eyes:     Conjunctiva/sclera: Conjunctivae normal.  Cardiovascular:     Rate and Rhythm: Normal rate and regular rhythm.     Heart sounds: Normal heart sounds. No murmur heard. Pulmonary:     Effort: Pulmonary effort is normal. No respiratory distress.     Breath sounds: Normal breath sounds. No wheezing, rhonchi or rales.  Skin:    General: Skin is warm and dry.  Neurological:     Mental Status: He is alert.  Psychiatric:        Mood and Affect: Mood normal.        Thought Content: Thought content normal.      UC Treatments / Results  Labs (all labs ordered are listed, but only abnormal results are displayed) Labs Reviewed - No data to display  EKG   Radiology No results found.  Procedures Procedures (including critical care time)  Medications Ordered in UC Medications - No data to display  Initial Impression / Assessment and Plan / UC Course  I have reviewed the triage vital signs and the nursing notes.  Pertinent labs & imaging results that were available during my care of the patient were reviewed by me and considered in my medical decision making (see chart for details).     *** Final Clinical Impressions(s) / UC Diagnoses   Final diagnoses:  None   Discharge Instructions   None    ED Prescriptions   None    PDMP not reviewed this encounter.

## 2022-02-11 NOTE — ED Triage Notes (Signed)
Pt presents cough with chest tightness and sore throat with congestion X 3 weeks.

## 2022-02-12 ENCOUNTER — Telehealth: Payer: Self-pay | Admitting: Physician Assistant

## 2022-02-12 ENCOUNTER — Telehealth: Payer: Self-pay

## 2022-02-12 ENCOUNTER — Encounter: Payer: Self-pay | Admitting: Physician Assistant

## 2022-02-12 MED ORDER — BENZONATATE 100 MG PO CAPS: 100.0000 mg | ORAL_CAPSULE | Freq: Three times a day (TID) | ORAL | 0 refills | Status: DC

## 2022-02-12 NOTE — Telephone Encounter (Signed)
Patient calling requesting cough medication. Tessalon pearles prescribed. Recommend follow up if no gradual improvement or with any further concerns.

## 2022-02-12 NOTE — Telephone Encounter (Signed)
Requested Prescriptions  Pending Prescriptions Disp Refills   lisinopril (ZESTRIL) 10 MG tablet [Pharmacy Med Name: Lisinopril 10 MG Oral Tablet] 30 tablet 0    Sig: Take 1 tablet by mouth once daily     There is no refill protocol information for this order

## 2022-02-16 ENCOUNTER — Telehealth: Payer: Self-pay | Admitting: Family Medicine

## 2022-02-16 NOTE — Telephone Encounter (Signed)
Spoke with patient to schedule awv.   Patient was not feeling well  I transferred to office   Patient wanted a call back in a couple weeks

## 2022-03-10 ENCOUNTER — Telehealth: Payer: Self-pay | Admitting: Family Medicine

## 2022-03-10 NOTE — Telephone Encounter (Signed)
Tried calling patient to schedule Medicare Annual Wellness Visit (AWV) either virtually or phone   my Herbie Drape number [phone]   due 11/30/20 awvi per [facility]  please schedule with Lucas

## 2022-03-12 ENCOUNTER — Ambulatory Visit: Payer: Medicare HMO | Admitting: Family Medicine

## 2022-04-29 ENCOUNTER — Other Ambulatory Visit: Payer: Self-pay

## 2022-04-29 DIAGNOSIS — R202 Paresthesia of skin: Secondary | ICD-10-CM

## 2022-04-30 ENCOUNTER — Ambulatory Visit: Payer: Medicare HMO | Admitting: Neurology

## 2022-04-30 DIAGNOSIS — R202 Paresthesia of skin: Secondary | ICD-10-CM

## 2022-04-30 DIAGNOSIS — M5417 Radiculopathy, lumbosacral region: Secondary | ICD-10-CM | POA: Diagnosis not present

## 2022-04-30 NOTE — Procedures (Signed)
  [facility]  [place], [place]  [place], [place] [address] Tel: [phone] Fax: [phone] Test Date:  04/30/2022  Patient: [name] DOB: [birth year] Physician: Narda Amber, DO  Sex: Male Height: 5' 7"$  Ref Phys: Edmonia Lynch, MD  ID#: [id number]   Technician:    History: This is a 67 year old man referred for evaluation of left medial lower leg paresthesias and pain.  NCV & EMG Findings: Extensive electrodiagnostic testing of the left lower extremity and additional studies of the right shows:  Left sural and superficial peroneal sensory responses are within normal limits.  Saphenous sensory responses are absent bilaterally, which normal for patient's age. Left peroneal and tibial motor responses are within normal limits. Left tibial H reflex study is within normal limits. Chronic motor axon loss changes are seen affecting the left S1 myotome, without accompanying active denervation.  Impression: Chronic S1 radiculopathy affecting the left lower extremity, very mild. There is no evidence of a large fiber sensorimotor polyneuropathy affecting the left lower extremity.   ___________________________ Narda Amber, DO    Nerve Conduction Studies   Stim Site NR Peak (ms) Norm Peak (ms) O-P Amp (V) Norm O-P Amp  Left Saphenous Anti Sensory (Ant Med Mall)  32 C  14cm *NR  <4.4  >3  Right Saphenous Anti Sensory (Ant Med Mall)  32 C  14cm *NR  <4.4  >3  Left Sup Peroneal Anti Sensory (Ant Lat Mall)  32 C  12 cm    2.4 <4.6 8.7 >3  Left Sural Anti Sensory (Lat Mall)  32 C  Calf    2.5 <4.6 19.4 >3     Stim Site NR Onset (ms) Norm Onset (ms) O-P Amp (mV) Norm O-P Amp Site1 Site2 Delta-0 (ms) Dist (cm) Vel (m/s) Norm Vel (m/s)  Left Peroneal Motor (Ext Dig Brev)  32 C  Ankle    3.5 <6.0 3.5 >2.5 B Fib Ankle 8.3 37.0 45 >40  B Fib    11.8  3.2  Poplt B Fib 2.0 10.0 50 >40  Poplt    13.8  2.9         Left Tibial Motor (Abd Hall Brev)  32 C   Ankle    4.0 <6.0 8.1 >4 Knee Ankle 9.0 40.0 44 >40  Knee    13.0  7.7          Electromyography   Side Muscle Ins.Act Fibs Fasc Recrt Amp Dur Poly Activation Comment  Left AntTibialis Nml Nml Nml Nml Nml Nml Nml Nml N/A  Left Gastroc Nml Nml Nml *1- *1+ *1+ *1+ Nml N/A  Left Flex Dig Long Nml Nml Nml Nml Nml Nml Nml Nml N/A  Left BicepsFemS Nml Nml Nml *1- *1+ *1+ *1+ Nml N/A  Left RectFemoris Nml Nml Nml Nml Nml Nml Nml Nml N/A  Left GluteusMed Nml Nml Nml Nml Nml Nml Nml Nml N/A      Waveforms:

## 2022-05-07 ENCOUNTER — Encounter: Payer: Self-pay | Admitting: Radiology

## 2022-07-10 ENCOUNTER — Telehealth: Payer: Self-pay | Admitting: Family Medicine

## 2022-07-10 NOTE — Telephone Encounter (Signed)
Called patient to schedule Medicare Annual Wellness Visit (AWV). Left message for patient to call back and schedule Medicare Annual Wellness Visit (AWV).  Last date of AWV: due 11/30/20 awvi per [facility]   Please transfer to [facility] @ [phone]  Thank you ,  Rudell Cobb AWV direct phone # [phone]

## 2022-08-31 ENCOUNTER — Ambulatory Visit: Payer: Medicare HMO | Admitting: Physician Assistant

## 2022-08-31 VITALS — BP 161/90 | HR 78 | Ht 67.0 in | Wt 205.0 lb

## 2022-08-31 DIAGNOSIS — M546 Pain in thoracic spine: Secondary | ICD-10-CM | POA: Diagnosis not present

## 2022-08-31 DIAGNOSIS — S40022A Contusion of left upper arm, initial encounter: Secondary | ICD-10-CM | POA: Diagnosis not present

## 2022-08-31 DIAGNOSIS — R972 Elevated prostate specific antigen [PSA]: Secondary | ICD-10-CM

## 2022-08-31 DIAGNOSIS — I10 Essential (primary) hypertension: Secondary | ICD-10-CM

## 2022-08-31 DIAGNOSIS — Z1211 Encounter for screening for malignant neoplasm of colon: Secondary | ICD-10-CM

## 2022-08-31 DIAGNOSIS — E119 Type 2 diabetes mellitus without complications: Secondary | ICD-10-CM

## 2022-08-31 DIAGNOSIS — Z23 Encounter for immunization: Secondary | ICD-10-CM

## 2022-08-31 DIAGNOSIS — M130 Polyarthritis, unspecified: Secondary | ICD-10-CM

## 2022-08-31 DIAGNOSIS — Z125 Encounter for screening for malignant neoplasm of prostate: Secondary | ICD-10-CM

## 2022-08-31 DIAGNOSIS — I700 Atherosclerosis of aorta: Secondary | ICD-10-CM

## 2022-08-31 DIAGNOSIS — R011 Cardiac murmur, unspecified: Secondary | ICD-10-CM

## 2022-08-31 DIAGNOSIS — E559 Vitamin D deficiency, unspecified: Secondary | ICD-10-CM

## 2022-08-31 LAB — POCT GLYCOSYLATED HEMOGLOBIN (HGB A1C): Hemoglobin A1C: 5.9 % — AB (ref 4.0–5.6)

## 2022-08-31 MED ORDER — BLOOD PRESSURE KIT DEVI
1.0000 [IU] | Freq: Every day | 0 refills | Status: AC
Start: 2022-08-31 — End: ?

## 2022-08-31 MED ORDER — METHYLPREDNISOLONE ACETATE 80 MG/ML IJ SUSP
80.0000 mg | Freq: Once | INTRAMUSCULAR | Status: AC
  Administered 2022-08-31: 80 mg via INTRAMUSCULAR

## 2022-08-31 MED ORDER — LISINOPRIL 10 MG PO TABS: 10.0000 mg | ORAL_TABLET | Freq: Every day | ORAL | 0 refills | Status: DC

## 2022-08-31 MED ORDER — KETOROLAC TROMETHAMINE 60 MG/2ML IM SOLN
60.0000 mg | Freq: Once | INTRAMUSCULAR | Status: AC
  Administered 2022-08-31: 60 mg via INTRAMUSCULAR

## 2022-08-31 NOTE — Patient Instructions (Addendum)
You are going to restart lisinopril 10 mg once daily.  I encourage you to check your blood pressure on a daily basis, keep a written log and have available for all office visits.  Call the number on the back of your Medicare card to find out where you can use the prescription to get a blood pressure cuff.  I did hear a heart murmur today, I have started a referral for you to be seen by cardiology for further evaluation.  I started a referral for you to be seen by gastroenterology for colon cancer screening.  To help with the bruise on the inside of your arm, I do encourage you to use ice packs.  Please return to the mobile unit in 1 month, we will call you with the beginning of that week and let you know where we will be located.  Please plan on having your fasting cholesterol checked at that office visit.  We will call you with today's lab results.  Roney Jaffe, PA-C Physician Assistant [facility] [email or web address]  Heart Murmur A heart murmur is an extra sound that is caused by turbulent blood flow through the valves of the heart. The murmur can be heard as a "hum" or "whoosh" sound when blood flows through the heart. The heart has four areas called chambers. There is a valve for each chamber for the heart. Blood passes through a valve before leaving the chamber. Two of the valves move the blood from the upper chambers of the heart to the lower chambers of the heart (tricuspid valve and mitral valve). The other two valves (aortic valve and pulmonary valve) move the blood to the lungs and the rest of the body. The valves keep blood moving through the heart in the right direction. When the heart valves are not working properly it may cause a murmur. There are two types of heart murmurs: Innocent (benign) murmurs. Most people with this type of heart murmur do not have a heart problem. Many children have innocent heart murmurs. Your  health care provider may suggest some basic tests to find out whether your murmur is an innocent murmur. If an innocent heart murmur is found, there is no need for further tests or treatment and no need to restrict activities or stop playing sports. Abnormal murmurs. These types of murmurs can occur in children and adults. Abnormal murmurs may be a sign of a more serious heart condition, such as a heart abnormality present at birth (congenital defect) or heart valve disease. What are the causes? This condition may also be caused by: Pregnancy. Fever. Overactive thyroid gland. Anemia. Exercise. Rapid growth spurts in children. What are the signs or symptoms? Innocent murmurs do not cause symptoms, and many people with abnormal murmurs may not have symptoms. If symptoms do develop, they may include: Shortness of breath or persistent cough. Blue coloring of the skin, especially on the fingertips. Chest pain. Palpitations, or feeling a fluttering or skipped heartbeat. Fainting. Getting tired much faster than expected. Swelling in the abdomen, feet, or ankles. How is this diagnosed? This condition may be diagnosed during a routine physical or other exam. If your health care provider hears a murmur with a stethoscope, he or she will listen for: Where the murmur is located in your heart. How loud the murmur is. This may help the health care provider figure out what is causing the murmur. You may be referred to a heart specialist (cardiologist). You may also have other tests,  including: Electrocardiogram (ECG or EKG). This test measures the electrical activity of your heart. Echocardiogram. This test uses high frequency sound waves to make pictures of your heart. Chest X-ray. Magnetic resonance imaging (MRI). Cardiac catheterization. This test looks at blood flow through the arteries around the heart. For children and adults who have an abnormal heart murmur and want to stay active, it is  important to: Complete testing. Review test results. Receive recommendations from your health care provider. If heart disease is present, it may not be safe to play or be involved in activities that require a lot of effort and energy (are strenuous). How is this treated? Heart murmurs themselves do not need treatment. In some cases, a heart murmur may go away on its own. If an underlying problem or disease is causing the murmur, you may need treatment. If treatment is needed, it will depend on the type and severity of the disease or heart problem causing the murmur. Treatment may include: Medicine. Surgery. Changes to your lifestyle and diet. Follow these instructions at home: Talk with your health care provider before participating in sports or other activities that are strenuous. Learn as much as possible about your condition and any related diseases. Ask your health care provider if you may be at risk for any medical emergencies. Talk with your health care provider about what symptoms you should look out for. It is up to you to get your test results. Ask your health care provider, or the department that is doing the test, when your results will be ready. Keep all follow-up visits. This is important. Contact a health care provider if: You are frequently short of breath. You feel more tired than usual. You are having a hard time keeping up with normal activities or fitness routines. You have swelling in your ankles or feet. You notice that your heart often beats irregularly. You develop any new symptoms. Get help right away if: You have chest pain. You are having trouble breathing. You feel light-headed or you faint. Your symptoms suddenly get worse. These symptoms may represent a serious problem that is an emergency. Do not wait to see if the symptoms will go away. Get medical help right away. Call your local emergency services (911 in the [place] Do not drive yourself to the  hospital. Summary Heart valves keep blood moving through the heart in the right direction. When the valves are not working properly it may cause a murmur. Innocent murmurs do not cause symptoms, and many people with abnormal murmurs may not have symptoms. You may need treatment if an underlying problem or disease is causing the heart murmur. Treatment may include medicine, surgery, and changes to your lifestyle and diet. Talk with your health care provider before participating in sports or other activities that are strenuous. Get help right away if you have chest pain or trouble breathing. This information is not intended to replace advice given to you by your health care provider. Make sure you discuss any questions you have with your health care provider. Document Revised: 05/27/2020 Document Reviewed: 05/27/2020 Elsevier Patient Education  2024 [employer].

## 2022-08-31 NOTE — Progress Notes (Unsigned)
Established Patient Office Visit  Subjective   Patient ID: [name], male    DOB: 17-Aug-1955  Age: 67 y.o. MRN: [id number]  Chief Complaint  Patient presents with   Arm Injury    Left arm bruise, noticed on Saturday. Bruise started small and has grown in the past few days. Numbness at site.   Back Pain    Midline pain, pain score of 6. Tightness in left arm     States that he has been out of his medications for the past year.  States that he does check his blood pressure when he is at work or if he is not feeling well.  States that he normally has readings of approximately 147/87.  Does endorse that he has been having episodes of some shortness of breath on exertion.  States that he has a bruise on his left upper arm.  States that he feels it is getting a little bit bigger, states that he was helping his friend move and was cleaning stuff out of a closet and believes that he got bit by something.  States that he did not see anything bite him.  States the area of the bruise feels numb and tight.  Has not tried anything for relief.  States that he has been having increased back pain, states that this started last night, describes it as upper middle back, states that he believes it is related to helping his friend move.  Denies radiation, numbness or tingling.  Has not tried anything for relief.  States that he is currently taking a daily vitamin, a beet gummy and 2 baby aspirin's along with 1 zinc tablet on a daily basis.  States he has not been checking his blood glucose levels.  States that he does drink ginger ale and occasional sweet tea, but does endorse that he does  like to eat a lot of candy.     Past Medical History:  Diagnosis Date   Agatston coronary artery calcium score between 200 and 399 10/2020   Coronary CTA showed increased coronary Ca at 237 with minimal CAD in the LCx and RCA.   Erectile dysfunction    Essential hypertension 11/30/2019   Gout    Heart murmur  09/01/2022   Infected prepatellar bursa, right    Moderate episode of recurrent major depressive disorder (HCC) 08/26/2020   denies updated 07/07/21   Polyarthritis    pt denies,updated 07/07/21   Pre-diabetes    Social History   Socioeconomic History   Marital status: Married    Spouse name: Not on file   Number of children: Not on file   Years of education: Not on file   Highest education level: Not on file  Occupational History   Not on file  Tobacco Use   Smoking status: Never   Smokeless tobacco: Never  Vaping Use   Vaping Use: Never used  Substance and Sexual Activity   Alcohol use: No    Alcohol/week: 0.0 standard drinks of alcohol   Drug use: No   Sexual activity: Not Currently  Other Topics Concern   Not on file  Social History Narrative   Not on file   Social Determinants of Health   Financial Resource Strain: Not on file  Food Insecurity: Not on file  Transportation Needs: Not on file  Physical Activity: Not on file  Stress: Not on file  Social Connections: Not on file  Intimate Partner Violence: Not on file   Family History  Problem Relation Age of Onset   Breast cancer Mother    Prostate cancer Father    Hypertension Father    Coronary artery disease Brother 38       MIs with CABG   Coronary artery disease Brother 62       MIs with CABG   Coronary artery disease Brother 21       MIs   Lung cancer Son    Colon cancer Neg Hx    Colon polyps Neg Hx    Crohn's disease Neg Hx    Esophageal cancer Neg Hx    Rectal cancer Neg Hx    Stomach cancer Neg Hx    No Known Allergies  Review of Systems  Constitutional: Negative.   HENT: Negative.    Eyes: Negative.   Respiratory:  Positive for shortness of breath. Negative for cough and wheezing.   Cardiovascular:  Negative for chest pain and leg swelling.  Gastrointestinal: Negative.   Genitourinary: Negative.   Musculoskeletal:  Positive for back pain.  Skin: Negative.   Neurological:  Negative for  speech change and headaches.  Endo/Heme/Allergies: Negative.   Psychiatric/Behavioral: Negative.        Objective:     BP (!) 161/90 (BP Location: Right Arm, Patient Position: Sitting, Cuff Size: Large)   Pulse 78   Ht 5\' 7"  (1.702 m)   Wt 205 lb (93 kg)   SpO2 98%   BMI 32.11 kg/m  BP Readings from Last 3 Encounters:  08/31/22 (!) 161/90  02/11/22 (!) 146/73  12/02/21 (!) 142/91   Wt Readings from Last 3 Encounters:  08/31/22 205 lb (93 kg)  07/07/21 194 lb (88 kg)  03/26/21 208 lb 3.2 oz (94.4 kg)      Physical Exam Vitals and nursing note reviewed.  Constitutional:      Appearance: Normal appearance.  HENT:     Head: Normocephalic and atraumatic.     Right Ear: External ear normal.     Left Ear: External ear normal.     Nose: Nose normal.     Mouth/Throat:     Mouth: Mucous membranes are moist.     Pharynx: Oropharynx is clear.  Eyes:     Extraocular Movements: Extraocular movements intact.     Conjunctiva/sclera: Conjunctivae normal.     Pupils: Pupils are equal, round, and reactive to light.  Cardiovascular:     Rate and Rhythm: Normal rate and regular rhythm.     Heart sounds: Murmur heard.  Pulmonary:     Effort: Pulmonary effort is normal.     Breath sounds: Normal breath sounds.  Musculoskeletal:     Cervical back: Normal, normal range of motion and neck supple.     Thoracic back: Tenderness present. No swelling. Decreased range of motion.     Lumbar back: No swelling. Decreased range of motion.     Comments: Slight pain elicited with range of motion testing.  Skin:    General: Skin is warm and dry.     Findings: Ecchymosis present.          Comments: Yellow, purple bruising noted, appears to be resolving, no swelling noted  Neurological:     General: No focal deficit present.     Mental Status: He is alert and oriented to person, place, and time.  Psychiatric:        Mood and Affect: Mood normal.        Behavior: Behavior normal.  Thought Content: Thought content normal.        Judgment: Judgment normal.        Assessment & Plan:   Problem List Items Addressed This Visit       Cardiovascular and Mediastinum   Essential hypertension - Primary   Relevant Medications   lisinopril (ZESTRIL) 10 MG tablet   Blood Pressure Monitoring (BLOOD PRESSURE KIT) DEVI   Other Relevant Orders   CBC with Differential/Platelet   Comp. Metabolic Panel (12)   Ambulatory referral to Cardiology   Aortic atherosclerosis (HCC)   Relevant Medications   lisinopril (ZESTRIL) 10 MG tablet   Other Relevant Orders   Ambulatory referral to Cardiology     Endocrine   Type 2 diabetes mellitus without complication, without long-term current use of insulin (HCC)   Relevant Medications   lisinopril (ZESTRIL) 10 MG tablet   Other Relevant Orders   Microalbumin / creatinine urine ratio   HgB A1c (Completed)     Musculoskeletal and Integument   Polyarthritis     Other   Vitamin D deficiency   Relevant Orders   Vitamin D, 25-hydroxy   Heart murmur   Relevant Orders   Ambulatory referral to Cardiology   Other Visit Diagnoses     Contusion of left upper extremity, initial encounter       Special screening for malignant neoplasms, colon       Relevant Orders   Ambulatory referral to Gastroenterology   Screening PSA (prostate specific antigen)       Relevant Orders   PSA   Need for Tdap vaccination       Relevant Orders   Tdap vaccine greater than or equal to 7yo IM (Completed)      1. Essential hypertension Patient agreeable to resume lisinopril.  Patient encouraged to check blood pressure at home, keep a written log and have available for all office visits.  Red flags given for prompt reevaluation  Patient will utilize mobile unit as his primary care provider.  Patient to return to mobile unit in 1 month for follow-up. - lisinopril (ZESTRIL) 10 MG tablet; Take 1 tablet (10 mg total) by mouth daily.  Dispense: 30 tablet;  Refill: 0 - CBC with Differential/Platelet - Comp. Metabolic Panel (12) - Blood Pressure Monitoring (BLOOD PRESSURE KIT) DEVI; 1 Units by Does not apply route daily.  Dispense: 1 each; Refill: 0 - Ambulatory referral to Cardiology  2. Type 2 diabetes mellitus without complication, without long-term current use of insulin (HCC) A1c 5.9.  Patient does not want to resume metformin at this time.  Patient encouraged to follow low sugar diet, we will recheck in 3 months. - Microalbumin / creatinine urine ratio - HgB A1c  3. Contusion of left upper extremity, initial encounter Patient education given on supportive care  4. Acute back pain Patient education given on supportive care - methylPREDNISolone acetate (DEPO-MEDROL) injection 80 mg - ketorolac (TORADOL) injection 60 mg  5. Heart murmur Patient education given on supportive care, red flags for prompt reevaluation - Ambulatory referral to Cardiology  6. Vitamin D deficiency  - Vitamin D, 25-hydroxy  7. Aortic atherosclerosis (HCC)  - Ambulatory referral to Cardiology  8. Special screening for malignant neoplasms, colon  - Ambulatory referral to Gastroenterology  9. Screening PSA (prostate specific antigen)  - PSA  10. Need for Tdap vaccination  - Tdap vaccine greater than or equal to 7yo IM   I have reviewed the patient's medical history (PMH, PSH, Social History,  Family History, Medications, and allergies) , and have been updated if relevant. I spent 40 minutes reviewing chart and  face to face time with patient.    Return in about 4 weeks (around 09/28/2022) for With [facility].    Kasandra Knudsen Mayers, PA-C

## 2022-09-01 ENCOUNTER — Encounter: Payer: Self-pay | Admitting: Physician Assistant

## 2022-09-01 DIAGNOSIS — I700 Atherosclerosis of aorta: Secondary | ICD-10-CM | POA: Insufficient documentation

## 2022-09-01 DIAGNOSIS — R011 Cardiac murmur, unspecified: Secondary | ICD-10-CM

## 2022-09-01 HISTORY — DX: Cardiac murmur, unspecified: R01.1

## 2022-09-01 LAB — CBC WITH DIFFERENTIAL/PLATELET
Basophils Absolute: 0.1 10*3/uL (ref 0.0–0.2)
Basos: 2 %
EOS (ABSOLUTE): 0 10*3/uL (ref 0.0–0.4)
Eos: 1 %
Hematocrit: 42.3 % (ref 37.5–51.0)
Hemoglobin: 14.2 g/dL (ref 13.0–17.7)
Immature Grans (Abs): 0 10*3/uL (ref 0.0–0.1)
Immature Granulocytes: 0 %
Lymphocytes Absolute: 1.7 10*3/uL (ref 0.7–3.1)
Lymphs: 28 %
MCH: 30.8 pg (ref 26.6–33.0)
MCHC: 33.6 g/dL (ref 31.5–35.7)
MCV: 92 fL (ref 79–97)
Monocytes Absolute: 0.5 10*3/uL (ref 0.1–0.9)
Monocytes: 8 %
Neutrophils Absolute: 3.7 10*3/uL (ref 1.4–7.0)
Neutrophils: 61 %
Platelets: 205 10*3/uL (ref 150–450)
RBC: 4.61 x10E6/uL (ref 4.14–5.80)
RDW: 13 % (ref 11.6–15.4)
WBC: 6.1 10*3/uL (ref 3.4–10.8)

## 2022-09-01 LAB — COMP. METABOLIC PANEL (12)
AST: 16 IU/L (ref 0–40)
Albumin: 4.6 g/dL (ref 3.9–4.9)
Alkaline Phosphatase: 70 IU/L (ref 44–121)
BUN/Creatinine Ratio: 8 — ABNORMAL LOW (ref 10–24)
BUN: 9 mg/dL (ref 8–27)
Bilirubin Total: 0.4 mg/dL (ref 0.0–1.2)
Calcium: 9.3 mg/dL (ref 8.6–10.2)
Chloride: 104 mmol/L (ref 96–106)
Creatinine, Ser: 1.16 mg/dL (ref 0.76–1.27)
Globulin, Total: 2.7 g/dL (ref 1.5–4.5)
Glucose: 117 mg/dL — ABNORMAL HIGH (ref 70–99)
Potassium: 4.2 mmol/L (ref 3.5–5.2)
Sodium: 138 mmol/L (ref 134–144)
Total Protein: 7.3 g/dL (ref 6.0–8.5)
eGFR: 69 mL/min/{1.73_m2} (ref >59)

## 2022-09-01 LAB — PSA: Prostate Specific Ag, Serum: 12.1 ng/mL — ABNORMAL HIGH (ref 0.0–4.0)

## 2022-09-01 LAB — MICROALBUMIN / CREATININE URINE RATIO
Creatinine, Urine: 213.9 mg/dL
Microalb/Creat Ratio: 10 mg/g creat (ref 0–29)
Microalbumin, Urine: 22.3 ug/mL

## 2022-09-01 LAB — VITAMIN D 25 HYDROXY (VIT D DEFICIENCY, FRACTURES): Vit D, 25-Hydroxy: 17.7 ng/mL — ABNORMAL LOW (ref 30.0–100.0)

## 2022-09-01 MED ORDER — VITAMIN D (ERGOCALCIFEROL) 1.25 MG (50000 UNIT) PO CAPS: 50000.0000 [IU] | ORAL_CAPSULE | ORAL | 2 refills | Status: AC

## 2022-09-01 NOTE — Addendum Note (Signed)
Addended by: Roney Jaffe on: 09/01/2022 03:35 PM   Modules accepted: Orders

## 2022-09-09 ENCOUNTER — Ambulatory Visit: Payer: Medicare HMO

## 2022-09-22 ENCOUNTER — Ambulatory Visit: Payer: Medicare HMO | Admitting: Urology

## 2022-09-29 ENCOUNTER — Telehealth: Payer: Self-pay | Admitting: Physician Assistant

## 2022-09-29 NOTE — Telephone Encounter (Signed)
Unable to contact pt to inform him of locations of mobile unit for follow up. LVM

## 2022-10-08 ENCOUNTER — Telehealth: Payer: Self-pay

## 2022-10-08 ENCOUNTER — Ambulatory Visit: Payer: Medicare HMO | Admitting: Physician Assistant

## 2022-10-08 ENCOUNTER — Encounter: Payer: Self-pay | Admitting: Physician Assistant

## 2022-10-08 VITALS — BP 139/78 | HR 80 | Temp 98.7°F | Ht 67.5 in | Wt 203.0 lb

## 2022-10-08 DIAGNOSIS — I10 Essential (primary) hypertension: Secondary | ICD-10-CM

## 2022-10-08 DIAGNOSIS — J069 Acute upper respiratory infection, unspecified: Secondary | ICD-10-CM

## 2022-10-08 MED ORDER — BENZONATATE 100 MG PO CAPS: ORAL_CAPSULE | ORAL | 0 refills | Status: AC

## 2022-10-08 MED ORDER — PREDNISONE 20 MG PO TABS: ORAL_TABLET | ORAL | 0 refills | Status: AC

## 2022-10-08 MED ORDER — AZITHROMYCIN 250 MG PO TABS: ORAL_TABLET | ORAL | 0 refills | Status: AC

## 2022-10-08 MED ORDER — LISINOPRIL 10 MG PO TABS: 10.0000 mg | ORAL_TABLET | Freq: Every day | ORAL | 0 refills | Status: AC

## 2022-10-08 NOTE — Telephone Encounter (Signed)
Called to schedule follow up visit with [facility], patient states he isn't feeling well and wanted to be seen today. Primary care on [place] location given and appointment offered for today.

## 2022-10-08 NOTE — Patient Instructions (Addendum)
You are going to take azithromycin for 5 days, take a short steroid taper and you can use Tessalon Perles to help you with your cough.  Make sure that you are getting plenty of rest and stay well-hydrated.  I hope that you feel better soon, please  return to the mobile unit in the next couple of weeks for follow-up of your blood pressure.  I encourage you to reach out to urology to reschedule your appointment for your elevated prostate level their information is  Dr. Marcha Solders.  [phone].   Roney Jaffe, PA-C Physician Assistant [facility] [email or web address]   Upper Respiratory Infection, Adult An upper respiratory infection (URI) is a common viral infection of the nose, throat, and upper air passages that lead to the lungs. The most common type of URI is the common cold. URIs usually get better on their own, without medical treatment. What are the causes? A URI is caused by a virus. You may catch a virus by: Breathing in droplets from an infected person's cough or sneeze. Touching something that has been exposed to the virus (is contaminated) and then touching your mouth, nose, or eyes. What increases the risk? You are more likely to get a URI if: You are very young or very old. You have close contact with others, such as at work, school, or a health care facility. You smoke. You have long-term (chronic) heart or lung disease. You have a weakened disease-fighting system (immune system). You have nasal allergies or asthma. You are experiencing a lot of stress. You have poor nutrition. What are the signs or symptoms? A URI usually involves some of the following symptoms: Runny or stuffy (congested) nose. Cough. Sneezing. Sore throat. Headache. Fatigue. Fever. Loss of appetite. Pain in your forehead, behind your eyes, and over your cheekbones (sinus pain). Muscle aches. Redness or irritation of the  eyes. Pressure in the ears or face. How is this diagnosed? This condition may be diagnosed based on your medical history and symptoms, and a physical exam. Your health care provider may use a swab to take a mucus sample from your nose (nasal swab). This sample can be tested to determine what virus is causing the illness. How is this treated? URIs usually get better on their own within 7-10 days. Medicines cannot cure URIs, but your health care provider may recommend certain medicines to help relieve symptoms, such as: Over-the-counter cold medicines. Cough suppressants. Coughing is a type of defense against infection that helps to clear the respiratory system, so take these medicines only as recommended by your health care provider. Fever-reducing medicines. Follow these instructions at home: Activity Rest as needed. If you have a fever, stay home from work or school until your fever is gone or until your health care provider says your URI cannot spread to other people (is no longer contagious). Your health care provider may have you wear a face mask to prevent your infection from spreading. Relieving symptoms Gargle with a mixture of salt and water 3-4 times a day or as needed. To make salt water, completely dissolve -1 tsp (3-6 g) of salt in 1 cup (237 mL) of warm water. Use a cool-mist humidifier to add moisture to the air. This can help you breathe more easily. Eating and drinking  Drink enough fluid to keep your urine pale yellow. Eat soups and other clear broths. General instructions  Take over-the-counter and prescription medicines only as told by your health care provider. These include cold  medicines, fever reducers, and cough suppressants. Do not use any products that contain nicotine or tobacco. These products include cigarettes, chewing tobacco, and vaping devices, such as e-cigarettes. If you need help quitting, ask your health care provider. Stay away from secondhand  smoke. Stay up to date on all immunizations, including the yearly (annual) flu vaccine. Keep all follow-up visits. This is important. How to prevent the spread of infection to others URIs can be contagious. To prevent the infection from spreading: Wash your hands with soap and water for at least 20 seconds. If soap and water are not available, use hand sanitizer. Avoid touching your mouth, face, eyes, or nose. Cough or sneeze into a tissue or your sleeve or elbow instead of into your hand or into the air.  Contact a health care provider if: You are getting worse instead of better. You have a fever or chills. Your mucus is brown or red. You have yellow or brown discharge coming from your nose. You have pain in your face, especially when you bend forward. You have swollen neck glands. You have pain while swallowing. You have white areas in the back of your throat. Get help right away if: You have shortness of breath that gets worse. You have severe or persistent: Headache. Ear pain. Sinus pain. Chest pain. You have chronic lung disease along with any of the following: Making high-pitched whistling sounds when you breathe, most often when you breathe out (wheezing). Prolonged cough (more than 14 days). Coughing up blood. A change in your usual mucus. You have a stiff neck. You have changes in your: Vision. Hearing. Thinking. Mood. These symptoms may be an emergency. Get help right away. Call 911. Do not wait to see if the symptoms will go away. Do not drive yourself to the hospital. Summary An upper respiratory infection (URI) is a common infection of the nose, throat, and upper air passages that lead to the lungs. A URI is caused by a virus. URIs usually get better on their own within 7-10 days. Medicines cannot cure URIs, but your health care provider may recommend certain medicines to help relieve symptoms. This information is not intended to replace advice given to you by  your health care provider. Make sure you discuss any questions you have with your health care provider. Document Revised: 09/18/2020 Document Reviewed: 09/18/2020 Elsevier Patient Education  2024 [employer].

## 2022-10-08 NOTE — Progress Notes (Signed)
Established Patient Office Visit  Subjective   Patient ID: [name], male    DOB: 04-21-1955  Age: 67 y.o. MRN: [id number]  Chief Complaint  Patient presents with   Chills    Symptoms started 1 week ago, worsening    URI    States that he started feeling poorly on Sunday, 5 days ago.  States that he has been experiencing bodyaches, chills, unmeasured fever, productive cough with clear sputum, low appetite.  States that he has been staying hydrated.  States that he has not tried anything for relief.  States that he did attend a funeral last 6 days prior to feeling ill and someone there was "getting over COVID" states that he also works at [employer] and was exposed to 50,000 different people on this last weekend.           Past Medical History:  Diagnosis Date   Agatston coronary artery calcium score between 200 and 399 10/2020   Coronary CTA showed increased coronary Ca at 237 with minimal CAD in the LCx and RCA.   Erectile dysfunction    Essential hypertension 11/30/2019   Gout    Heart murmur 09/01/2022   Infected prepatellar bursa, right    Moderate episode of recurrent major depressive disorder (HCC) 08/26/2020   denies updated 07/07/21   Polyarthritis    pt denies,updated 07/07/21   Pre-diabetes    Social History   Socioeconomic History   Marital status: Married    Spouse name: Not on file   Number of children: Not on file   Years of education: Not on file   Highest education level: Not on file  Occupational History   Not on file  Tobacco Use   Smoking status: Never   Smokeless tobacco: Never  Vaping Use   Vaping status: Never Used  Substance and Sexual Activity   Alcohol use: No    Alcohol/week: 0.0 standard drinks of alcohol   Drug use: No   Sexual activity: Not Currently  Other Topics Concern   Not on file  Social History Narrative   Not on file   Social Determinants of Health   Financial Resource Strain: Not on file  Food  Insecurity: Not on file  Transportation Needs: Not on file  Physical Activity: Not on file  Stress: Not on file  Social Connections: Not on file  Intimate Partner Violence: Not on file   Family History  Problem Relation Age of Onset   Breast cancer Mother    Prostate cancer Father    Hypertension Father    Coronary artery disease Brother 60       MIs with CABG   Coronary artery disease Brother 60       MIs with CABG   Coronary artery disease Brother 75       MIs   Lung cancer Son    Colon cancer Neg Hx    Colon polyps Neg Hx    Crohn's disease Neg Hx    Esophageal cancer Neg Hx    Rectal cancer Neg Hx    Stomach cancer Neg Hx    No Known Allergies  Review of Systems  Constitutional:  Positive for chills and fever.  HENT:  Positive for congestion. Negative for ear pain, sinus pain and sore throat.   Eyes: Negative.   Respiratory:  Positive for cough, sputum production and wheezing.   Cardiovascular:  Negative for chest pain.  Gastrointestinal: Negative.   Genitourinary: Negative.   Musculoskeletal:  Positive for myalgias.  Skin: Negative.   Neurological: Negative.   Endo/Heme/Allergies: Negative.   Psychiatric/Behavioral: Negative.        Objective:     BP 139/78 (BP Location: Left Arm, Patient Position: Sitting, Cuff Size: Large)   Pulse 80   Temp 98.7 F (37.1 C)   Ht 5' 7.5" (1.715 m)   Wt 203 lb (92.1 kg)   SpO2 94%   BMI 31.33 kg/m  BP Readings from Last 3 Encounters:  10/08/22 139/78  08/31/22 (!) 161/90  02/11/22 (!) 146/73   Wt Readings from Last 3 Encounters:  10/08/22 203 lb (92.1 kg)  08/31/22 205 lb (93 kg)  07/07/21 194 lb (88 kg)      Physical Exam Vitals and nursing note reviewed.  Constitutional:      Appearance: Normal appearance.  HENT:     Head: Normocephalic.     Salivary Glands: Right salivary gland is diffusely enlarged and tender. Left salivary gland is diffusely enlarged and tender.     Right Ear: Tympanic membrane, ear  canal and external ear normal.     Left Ear: Tympanic membrane, ear canal and external ear normal.     Nose:     Right Turbinates: Enlarged and swollen.     Left Turbinates: Enlarged and swollen.     Right Sinus: No maxillary sinus tenderness or frontal sinus tenderness.     Left Sinus: No maxillary sinus tenderness or frontal sinus tenderness.     Mouth/Throat:     Lips: Pink.     Pharynx: Oropharynx is clear.     Tonsils: No tonsillar exudate.  Eyes:     Conjunctiva/sclera: Conjunctivae normal.     Pupils: Pupils are equal, round, and reactive to light.  Cardiovascular:     Rate and Rhythm: Normal rate and regular rhythm.     Pulses: Normal pulses.     Heart sounds: Normal heart sounds.  Pulmonary:     Effort: Pulmonary effort is normal.     Breath sounds: Wheezing present.     Comments: Slight wheeze noted right upper and lower lobe Musculoskeletal:        General: Normal range of motion.     Cervical back: Normal range of motion and neck supple.  Skin:    General: Skin is warm and dry.  Neurological:     General: No focal deficit present.     Mental Status: He is alert and oriented to person, place, and time.  Psychiatric:        Mood and Affect: Mood normal.        Behavior: Behavior normal.        Thought Content: Thought content normal.        Judgment: Judgment normal.        Assessment & Plan:   Problem List Items Addressed This Visit       Cardiovascular and Mediastinum   Essential hypertension   Relevant Medications   lisinopril (ZESTRIL) 10 MG tablet   Other Visit Diagnoses     Acute URI    -  Primary   Relevant Medications   azithromycin (ZITHROMAX) 250 MG tablet   predniSONE (DELTASONE) 20 MG tablet   benzonatate (TESSALON) 100 MG capsule     1. Acute URI Acute URI versus bacterial bronchitis.  Trial azithromycin, prednisone taper, Tessalon Perles.  Patient education given on supportive care, red flags given for prompt reevaluation.  Patient  to follow-up with mobile unit in 2 weeks  for health maintenance.  Patient understands and agrees. - azithromycin (ZITHROMAX) 250 MG tablet; Take 2 tabs PO day 1, then take 1 tab PO once daily  Dispense: 6 tablet; Refill: 0 - predniSONE (DELTASONE) 20 MG tablet; Take 3 tablets (60 mg total) by mouth daily with breakfast for 2 days, THEN 2 tablets (40 mg total) daily with breakfast for 2 days, THEN 1 tablet (20 mg total) daily with breakfast for 2 days, THEN 0.5 tablets (10 mg total) daily with breakfast for 2 days.  Dispense: 13 tablet; Refill: 0 - benzonatate (TESSALON) 100 MG capsule; Take 1-2 caps PO TID PRN  Dispense: 20 capsule; Refill: 0    I have reviewed the patient's medical history (PMH, PSH, Social History, Family History, Medications, and allergies) , and have been updated if relevant. I spent 20 minutes reviewing chart and  face to face time with patient.    Return in about 2 weeks (around 10/22/2022) for With [facility].    Kasandra Knudsen Mayers, PA-C

## 2022-11-04 NOTE — Progress Notes (Deleted)
Cardiology Office Note:  .   Date:  11/04/2022  ID:  [name], DOB 18-Mar-1955, MRN [id number] PCP: Diona Foley  [facility] Providers Cardiologist:  None { Click to update primary MD,subspecialty MD or APP then REFRESH:1}   History of Present Illness: Marland Kitchen   [name] is a 67 y.o. male with history of HTN, HLD pre diabetes, family history of CAD-6 brothers and 3 of them have had MIs and 2 had CABG. Coronary CTA 10/2020 calcium score 237, minimal CAD in Cfx and RCA.  ROS: ***  Studies Reviewed: Marland Kitchen         Prior CV Studies: {Select studies to display:26339}  Coronary CTA 10/2020 IMPRESSION: 1. Coronary calcium score of 237. This was 70 percentile for age-, sex, and race-matched controls.   2. Normal coronary origin with right dominance.   3. Minimal CAD as outlined above.   4. Aortic atherosclerosis.  Risk Assessment/Calculations:   {Does this patient have ATRIAL FIBRILLATION?:[phone]} No BP recorded.  {Refresh Note OR Click here to enter BP  :1}***       Physical Exam:   VS:  There were no vitals taken for this visit.   Wt Readings from Last 3 Encounters:  10/08/22 203 lb (92.1 kg)  08/31/22 205 lb (93 kg)  07/07/21 194 lb (88 kg)    GEN: Well nourished, well developed in no acute distress NECK: No JVD; No carotid bruits CARDIAC: ***RRR, no murmurs, rubs, gallops RESPIRATORY:  Clear to auscultation without rales, wheezing or rhonchi  ABDOMEN: Soft, non-tender, non-distended EXTREMITIES:  No edema; No deformity   ASSESSMENT AND PLAN: .    CAD minimal on CTA 10/2020  HTN  HLD  Family history of CAD  Pre-diabetes     {Are you ordering a CV Procedure (e.g. stress test, cath, DCCV, TEE, etc)?   Press F2        :[id number]}  Dispo: ***  Signed, Jacolyn Reedy, PA-C

## 2022-11-16 ENCOUNTER — Ambulatory Visit: Payer: Medicare HMO | Attending: Physician Assistant | Admitting: Physician Assistant

## 2022-12-20 ENCOUNTER — Other Ambulatory Visit: Payer: Self-pay | Admitting: Family Medicine

## 2022-12-20 DIAGNOSIS — I10 Essential (primary) hypertension: Secondary | ICD-10-CM

## 2022-12-23 ENCOUNTER — Other Ambulatory Visit: Payer: Self-pay | Admitting: Family Medicine

## 2022-12-23 DIAGNOSIS — I10 Essential (primary) hypertension: Secondary | ICD-10-CM

## 2023-05-15 ENCOUNTER — Emergency Department (HOSPITAL_COMMUNITY)
Admission: EM | Admit: 2023-05-15 | Discharge: 2023-05-15 | Disposition: A | Discharge status: Home / Self Care | Payer: Medicare (Managed Care) | Attending: Emergency Medicine | Admitting: Emergency Medicine

## 2023-05-15 ENCOUNTER — Emergency Department (HOSPITAL_COMMUNITY): Payer: Medicare (Managed Care)

## 2023-05-15 ENCOUNTER — Other Ambulatory Visit: Payer: Self-pay

## 2023-05-15 DIAGNOSIS — K529 Noninfective gastroenteritis and colitis, unspecified: Secondary | ICD-10-CM | POA: Diagnosis not present

## 2023-05-15 DIAGNOSIS — Z79899 Other long term (current) drug therapy: Secondary | ICD-10-CM | POA: Insufficient documentation

## 2023-05-15 DIAGNOSIS — D72829 Elevated white blood cell count, unspecified: Secondary | ICD-10-CM | POA: Insufficient documentation

## 2023-05-15 DIAGNOSIS — I2510 Atherosclerotic heart disease of native coronary artery without angina pectoris: Secondary | ICD-10-CM | POA: Insufficient documentation

## 2023-05-15 DIAGNOSIS — I10 Essential (primary) hypertension: Secondary | ICD-10-CM | POA: Insufficient documentation

## 2023-05-15 DIAGNOSIS — Z7982 Long term (current) use of aspirin: Secondary | ICD-10-CM | POA: Diagnosis not present

## 2023-05-15 DIAGNOSIS — R059 Cough, unspecified: Secondary | ICD-10-CM | POA: Insufficient documentation

## 2023-05-15 DIAGNOSIS — R509 Fever, unspecified: Secondary | ICD-10-CM | POA: Diagnosis not present

## 2023-05-15 DIAGNOSIS — R112 Nausea with vomiting, unspecified: Secondary | ICD-10-CM | POA: Diagnosis present

## 2023-05-15 LAB — RESP PANEL BY RT-PCR (RSV, FLU A&B, COVID)  RVPGX2
Influenza A by PCR: NEGATIVE
Influenza B by PCR: NEGATIVE
Resp Syncytial Virus by PCR: NEGATIVE
SARS Coronavirus 2 by RT PCR: NEGATIVE

## 2023-05-15 LAB — CBC WITH DIFFERENTIAL/PLATELET
Abs Immature Granulocytes: 0.04 10*3/uL (ref 0.00–0.07)
Basophils Absolute: 0 10*3/uL (ref 0.0–0.1)
Basophils Relative: 0 %
Eosinophils Absolute: 0 10*3/uL (ref 0.0–0.5)
Eosinophils Relative: 0 %
HCT: 45.1 % (ref 39.0–52.0)
Hemoglobin: 15.5 g/dL (ref 13.0–17.0)
Immature Granulocytes: 0 %
Lymphocytes Relative: 4 %
Lymphs Abs: 0.4 10*3/uL — ABNORMAL LOW (ref 0.7–4.0)
MCH: 31.4 pg (ref 26.0–34.0)
MCHC: 34.4 g/dL (ref 30.0–36.0)
MCV: 91.3 fL (ref 80.0–100.0)
Monocytes Absolute: 0.5 10*3/uL (ref 0.1–1.0)
Monocytes Relative: 5 %
Neutro Abs: 10.8 10*3/uL — ABNORMAL HIGH (ref 1.7–7.7)
Neutrophils Relative %: 91 %
Platelets: 375 10*3/uL (ref 150–400)
RBC: 4.94 MIL/uL (ref 4.22–5.81)
RDW: 12 % (ref 11.5–15.5)
WBC: 11.8 10*3/uL — ABNORMAL HIGH (ref 4.0–10.5)
nRBC: 0 % (ref 0.0–0.2)

## 2023-05-15 LAB — COMPREHENSIVE METABOLIC PANEL
ALT: 21 U/L (ref 0–44)
AST: 21 U/L (ref 15–41)
Albumin: 4.3 g/dL (ref 3.5–5.0)
Alkaline Phosphatase: 71 U/L (ref 38–126)
Anion gap: 11 (ref 5–15)
BUN: 12 mg/dL (ref 8–23)
CO2: 23 mmol/L (ref 22–32)
Calcium: 9.4 mg/dL (ref 8.9–10.3)
Chloride: 106 mmol/L (ref 98–111)
Creatinine, Ser: 1.07 mg/dL (ref 0.61–1.24)
GFR, Estimated: >60 mL/min (ref >60)
Glucose, Bld: 120 mg/dL — ABNORMAL HIGH (ref 70–99)
Potassium: 3.9 mmol/L (ref 3.5–5.1)
Sodium: 140 mmol/L (ref 135–145)
Total Bilirubin: 1.1 mg/dL (ref 0.0–1.2)
Total Protein: 7.7 g/dL (ref 6.5–8.1)

## 2023-05-15 MED ORDER — ONDANSETRON HCL 4 MG/2ML IJ SOLN
4.0000 mg | Freq: Once | INTRAMUSCULAR | Status: AC
  Administered 2023-05-15: 4 mg via INTRAVENOUS
  Filled 2023-05-15: qty 2

## 2023-05-15 MED ORDER — ACETAMINOPHEN 500 MG PO TABS
1000.0000 mg | ORAL_TABLET | Freq: Once | ORAL | Status: AC
Start: 2023-05-15 — End: 2023-05-15
  Administered 2023-05-15: 1000 mg via ORAL
  Filled 2023-05-15: qty 2

## 2023-05-15 MED ORDER — LACTATED RINGERS IV BOLUS
1000.0000 mL | Freq: Once | INTRAVENOUS | Status: AC
  Administered 2023-05-15: 1000 mL via INTRAVENOUS

## 2023-05-15 MED ORDER — IBUPROFEN 200 MG PO TABS
600.0000 mg | ORAL_TABLET | Freq: Once | ORAL | Status: AC
  Administered 2023-05-15: 600 mg via ORAL
  Filled 2023-05-15: qty 3

## 2023-05-15 MED ORDER — ONDANSETRON 4 MG PO TBDP: 4.0000 mg | ORAL_TABLET | Freq: Three times a day (TID) | ORAL | 0 refills | Status: AC | PRN

## 2023-05-15 MED ORDER — DICYCLOMINE HCL 10 MG PO CAPS
10.0000 mg | ORAL_CAPSULE | Freq: Once | ORAL | Status: AC
  Administered 2023-05-15: 10 mg via ORAL
  Filled 2023-05-15: qty 1

## 2023-05-15 MED ORDER — DICYCLOMINE HCL 20 MG PO TABS: 20.0000 mg | ORAL_TABLET | Freq: Two times a day (BID) | ORAL | 0 refills | Status: AC | PRN

## 2023-05-15 NOTE — Discharge Instructions (Addendum)
 As discussed, you may have food poisoning from the ribs you ate last night.  You did have a elevation in your white blood cell count which can indicate some inflammation or infection in the body.  Otherwise your labs look reassuring.  Your kidney, liver, and pancreas labs are normal today.  Your electrolytes were normal today.  Your COVID, flu, and RSV testing was negative.  Your chest x-ray did not show any signs of pneumonia.  You have been prescribed Zofran which is the nausea medication you are given here today.  You may take this up to every 8 hours as needed for nausea and vomiting.  You were also given a medication called bentyl to help with abdominal cramping and pain. You may take this up to 2 times daily as needed for abdominal cramping.  You may progress your diet as tolerated.   You may take up to 1000mg  of tylenol every 6 hours as needed for pain. Do not take more then 4g per day.   You may use up to 600mg  ibuprofen every 6 hours as needed for pain.  Do not exceed 2.4g of ibuprofen per day.  Please follow-up with your PCP if symptoms are not starting to improve within the next 3 days.  Your blood pressure was elevated today. Please record your blood pressures at home and follow up with your PCP within the next month for re-evaluation of your blood pressures.  Return to the ER for any uncontrolled vomiting, fevers, severe abdominal pain, any other new or concerning symptoms.

## 2023-05-15 NOTE — ED Triage Notes (Signed)
 Pt reports cough x 4 weeks and started vomiting today, pt reports  that he has vomited so much secretions now yellow.

## 2023-05-15 NOTE — ED Provider Notes (Signed)
 [name] AT [name] Provider Note   CSN: [id number] Arrival date & time: 05/15/23  2010     History  Chief Complaint  Patient presents with   Emesis    [name] is a 68 y.o. male with history of heart murmur, hypertension, CAD, presents with concern for a productive cough ongoing for the past 4 weeks.  States he is coughing up yellowish mucus.  Today, his symptoms worsened to include nausea and vomiting.  He also reports a couple episodes of nonbloody diarrhea.  This started after eating ribs that were sitting out for couple hours last night.  He also reports some subjective fever and chills at home. Denies any chest pain or shortness of breath.  No recent antibiotic use, hospitalizations, or travel.   Emesis      Home Medications Prior to Admission medications   Medication Sig Start Date End Date Taking? Authorizing Provider  dicyclomine (BENTYL) 20 MG tablet Take 1 tablet (20 mg total) by mouth 2 (two) times daily as needed for spasms (Abdominal cramping). 05/15/23  Yes Arabella Merles, PA-C  ondansetron (ZOFRAN-ODT) 4 MG disintegrating tablet Take 1 tablet (4 mg total) by mouth every 8 (eight) hours as needed for nausea or vomiting. 05/15/23  Yes Arabella Merles, PA-C  Accu-Chek Softclix Lancets lancets Use as instructed Patient not taking: Reported on 07/07/2021 03/11/21   Mayers, Cari S, PA-C  acetaminophen (TYLENOL) 500 MG tablet Take 2 tablets (1,000 mg total) by mouth every 6 (six) hours as needed for mild pain. Patient not taking: Reported on 08/31/2022 05/02/20   Henrene Dodge, MD  albuterol (VENTOLIN HFA) 108 (90 Base) MCG/ACT inhaler Inhale 1-2 puffs into the lungs every 6 (six) hours as needed for wheezing or shortness of breath. Patient not taking: Reported on 08/31/2022 12/02/21   Gustavus Bryant, FNP  aspirin EC 81 MG tablet Take 1 tablet (81 mg total) by mouth daily. Swallow whole. Patient not taking: Reported on 08/31/2022 11/15/20    Quintella Reichert, MD  azithromycin (ZITHROMAX) 250 MG tablet Take 2 tabs PO day 1, then take 1 tab PO once daily 10/08/22   Mayers, Cari S, PA-C  benzonatate (TESSALON) 100 MG capsule Take 1-2 caps PO TID PRN 10/08/22   Mayers, Cari S, PA-C  Blood Pressure Monitoring (BLOOD PRESSURE KIT) DEVI 1 Units by Does not apply route daily. Patient not taking: Reported on 10/08/2022 08/31/22   Mayers, Cari S, PA-C  DULoxetine (CYMBALTA) 30 MG capsule Take 1 capsule (30 mg total) by mouth daily. Patient not taking: Reported on 07/07/2021 03/11/21   Mayers, Cari S, PA-C  fluticasone (FLONASE) 50 MCG/ACT nasal spray Place 1 spray into both nostrils daily for 3 days. 12/02/21 12/05/21  Gustavus Bryant, FNP  glucose blood (ACCU-CHEK AVIVA PLUS) test strip Use as instructed Patient not taking: Reported on 07/07/2021 03/11/21   Mayers, Cari S, PA-C  ibuprofen (ADVIL) 600 MG tablet Take 1 tablet (600 mg total) by mouth every 8 (eight) hours as needed for moderate pain. Patient not taking: Reported on 08/31/2022 05/02/20   Henrene Dodge, MD  lisinopril (ZESTRIL) 10 MG tablet Take 1 tablet (10 mg total) by mouth daily. 10/08/22   Mayers, Cari S, PA-C  metFORMIN (GLUCOPHAGE XR) 500 MG 24 hr tablet Take 1 tablet (500 mg total) by mouth daily with breakfast. Patient not taking: Reported on 07/07/2021 03/11/21   Mayers, Cari S, PA-C  Vitamin D, Ergocalciferol, (DRISDOL) 1.25 MG (50000 UNIT) CAPS capsule  Take 1 capsule (50,000 Units total) by mouth every 7 (seven) days. 09/01/22   Mayers, Cari S, PA-C      Allergies    Patient has no known allergies.    Review of Systems   Review of Systems  Gastrointestinal:  Positive for vomiting.    Physical Exam Updated Vital Signs BP (!) 153/81   Pulse 79   Temp 100.2 F (37.9 C) (Oral)   Resp 15   Ht 5\' 7"  (1.702 m)   Wt 88 kg   SpO2 94%   BMI 30.38 kg/m  Physical Exam Vitals and nursing note reviewed.  Constitutional:      General: He is not in acute distress.    Appearance: He is  well-developed.     Comments: Not actively vomiting in the room  HENT:     Head: Normocephalic and atraumatic.     Mouth/Throat:     Pharynx: No oropharyngeal exudate or posterior oropharyngeal erythema.  Eyes:     Conjunctiva/sclera: Conjunctivae normal.  Cardiovascular:     Rate and Rhythm: Normal rate and regular rhythm.     Heart sounds: No murmur heard. Pulmonary:     Effort: Pulmonary effort is normal. No respiratory distress.     Breath sounds: Normal breath sounds.  Abdominal:     Palpations: Abdomen is soft.     Tenderness: There is no abdominal tenderness.  Musculoskeletal:        General: No swelling.     Cervical back: Neck supple.  Skin:    General: Skin is warm and dry.     Capillary Refill: Capillary refill takes less than 2 seconds.  Neurological:     Mental Status: He is alert.  Psychiatric:        Mood and Affect: Mood normal.     ED Results / Procedures / Treatments   Labs (all labs ordered are listed, but only abnormal results are displayed) Labs Reviewed  CBC WITH DIFFERENTIAL/PLATELET - Abnormal; Notable for the following components:      Result Value   WBC 11.8 (*)    Neutro Abs 10.8 (*)    Lymphs Abs 0.4 (*)    All other components within normal limits  COMPREHENSIVE METABOLIC PANEL - Abnormal; Notable for the following components:   Glucose, Bld 120 (*)    All other components within normal limits  RESP PANEL BY RT-PCR (RSV, FLU A&B, COVID)  [id number]    EKG None  Radiology DG Chest 2 View Result Date: 05/15/2023 CLINICAL DATA:  Cough EXAM: CHEST - 2 VIEW COMPARISON:  02/11/2022 FINDINGS: The heart size and mediastinal contours are within normal limits. Both lungs are clear. The visualized skeletal structures are unremarkable. IMPRESSION: Normal study. Electronically Signed   By: Charlett Nose M.D.   On: 05/15/2023 20:47    Procedures Procedures    Medications Ordered in ED Medications  ondansetron [facility]) injection 4 mg (4 mg  Intravenous Given 05/15/23 2057)  lactated ringers bolus 1,000 mL (0 mLs Intravenous Stopped 05/15/23 2254)  acetaminophen (TYLENOL) tablet 1,000 mg (1,000 mg Oral Given 05/15/23 2038)  dicyclomine (BENTYL) capsule 10 mg (10 mg Oral Given 05/15/23 2232)  ibuprofen (ADVIL) tablet 600 mg (600 mg Oral Given 05/15/23 2232)    ED Course/ Medical Decision Making/ A&P                                 Medical Decision Making  Amount and/or Complexity of Data Reviewed Labs: ordered. Radiology: ordered.  Risk OTC drugs. Prescription drug management.     Differential diagnosis includes but is not limited to COVID, flu, RSV, viral URI, strep pharyngitis, viral pharyngitis, allergic rhinitis, pneumonia, bronchitis   ED Course:  Patient overall well-appearing, does have tachycardia to 112 upon arrival.  Given sudden onset of nausea, vomiting, and diarrhea today after eating ribs that were left out last night, have higher clinical concern at the moment for gastroenteritis.  Given he reports a cough that has been ongoing for past 4 weeks, will obtain chest x-ray for evaluation possible pneumonia or other etiology. Patient was initially started on 1 L LR bolus, given Zofran for nausea, and Tylenol for pain. I Ordered, and personally interpreted labs.  The pertinent results include:   CMP unremarkable CBC with leukocytosis of 11.8 COVID, flu, RSV negative Upon re-evaluation, patient states he is feeling better after the fluids.  Reports he still feels nauseous, but is tolerating p.o. intake of water without any further episodes of emesis.  He does report ongoing abdominal cramping and headache.  I ordered Bentyl and ibuprofen for the symptoms. Given no abdominal tenderness palpation, no elevation in LFTs or creatinine, lower concern for acute abdominal pathology at this time.  No concern for C. difficile given no recent antibiotic use or hospitalizations. He has a good story for gastroenteritis, eating  ribs that were left out last night and symptoms started right after this.  Suspect gastroenteritis at this time. He reported a dry cough for the past 4 weeks, but chest x-ray without signs of pneumonia.  Suspect he likely had a viral URI and has lingering cough from this.  Patient requests discharge home.  He had improvement in his tachycardia, now at 79.  He is tolerating p.o. intake.  Low concern for any other emergent abdominal pathology at this time.  Suspect symptoms are from gastroenteritis. Stable and appropriate for discharge home.  Impression: Nausea, vomiting, and diarrhea likely due to gastroenteritis  Disposition:  The patient was discharged home with instructions to take prescribed Zofran as needed for nausea.  May also take the prescribed Bentyl prescribed as needed for abdominal pain and cramping.  Tylenol and ibuprofen as needed for pain.  Keep well-hydrated at home with water.  May advance diet as tolerated.  Follow-up with PCP if symptoms not improving within the next 3 days.  Discussed that his blood pressure was elevated here today.  Recommended keeping a log of blood pressures at home and following up with PCP regarding his blood pressure within the next month. Return precautions given.  Imaging Studies ordered: I ordered imaging studies including chest x-ray I independently visualized the imaging with scope of interpretation limited to determining acute life threatening conditions related to emergency care. Imaging showed no acute abnormalities I agree with the radiologist interpretation   Cardiac Monitoring: / EKG: The patient was maintained on a cardiac monitor.  I personally viewed and interpreted the cardiac monitored which showed an underlying rhythm of: Normal sinus rhythm                 Final Clinical Impression(s) / ED Diagnoses Final diagnoses:  Gastroenteritis    Rx / DC Orders ED Discharge Orders          Ordered    dicyclomine (BENTYL)  20 MG tablet  2 times daily PRN        05/15/23 2307    ondansetron (ZOFRAN-ODT) 4 MG disintegrating  tablet  Every 8 hours PRN        05/15/23 2307              Arabella Merles, PA-C 05/15/23 2338    Arby Barrette, MD 05/20/23 [phone]
